# Patient Record
Sex: Female | Born: 1990 | Race: Black or African American | Hispanic: No | Marital: Single | Smoking: Former smoker
Health system: Southern US, Community
[De-identification: ages and names within clinical notes are randomized; demographics above are authoritative.]

## PROBLEM LIST (undated history)

## (undated) ENCOUNTER — Emergency Department (HOSPITAL_COMMUNITY): Admission: EM | Discharge status: Absent without leave | Payer: Medicaid Other

## (undated) DIAGNOSIS — E0500 Thyrotoxicosis with diffuse goiter without thyrotoxic crisis or storm: Secondary | ICD-10-CM

## (undated) DIAGNOSIS — O24419 Gestational diabetes mellitus in pregnancy, unspecified control: Secondary | ICD-10-CM

## (undated) DIAGNOSIS — Z789 Other specified health status: Secondary | ICD-10-CM

## (undated) HISTORY — PX: MOUTH SURGERY: SHX715

## (undated) HISTORY — PX: WISDOM TOOTH EXTRACTION: SHX21

## (undated) HISTORY — DX: Gestational diabetes mellitus in pregnancy, unspecified control: O24.419

## (undated) HISTORY — DX: Other specified health status: Z78.9

## (undated) HISTORY — DX: Thyrotoxicosis with diffuse goiter without thyrotoxic crisis or storm: E05.00

---

## 2011-12-17 ENCOUNTER — Encounter: Payer: Self-pay | Admitting: Obstetrics & Gynecology

## 2013-01-26 ENCOUNTER — Encounter (HOSPITAL_COMMUNITY): Payer: Self-pay | Admitting: Emergency Medicine

## 2013-01-26 ENCOUNTER — Observation Stay (HOSPITAL_COMMUNITY)
Admission: EM | Admit: 2013-01-26 | Discharge: 2013-01-27 | Disposition: A | Discharge status: Home / Self Care | Payer: Medicaid Other | Attending: General Surgery | Admitting: General Surgery

## 2013-01-26 ENCOUNTER — Emergency Department (HOSPITAL_COMMUNITY): Payer: Medicaid Other

## 2013-01-26 DIAGNOSIS — R110 Nausea: Secondary | ICD-10-CM | POA: Insufficient documentation

## 2013-01-26 DIAGNOSIS — R1031 Right lower quadrant pain: Principal | ICD-10-CM | POA: Insufficient documentation

## 2013-01-26 DIAGNOSIS — R52 Pain, unspecified: Secondary | ICD-10-CM | POA: Insufficient documentation

## 2013-01-26 LAB — BASIC METABOLIC PANEL
BUN: 10 mg/dL (ref 6–23)
CO2: 22 mEq/L (ref 19–32)
Calcium: 9.3 mg/dL (ref 8.4–10.5)
Creatinine, Ser: 0.7 mg/dL (ref 0.50–1.10)
Glucose, Bld: 90 mg/dL (ref 70–99)

## 2013-01-26 LAB — URINE MICROSCOPIC-ADD ON

## 2013-01-26 LAB — CBC WITH DIFFERENTIAL/PLATELET
Basophils Relative: 0 % (ref 0–1)
Eosinophils Relative: 5 % (ref 0–5)
HCT: 33.1 % — ABNORMAL LOW (ref 36.0–46.0)
Hemoglobin: 10.1 g/dL — ABNORMAL LOW (ref 12.0–15.0)
Lymphs Abs: 3.4 10*3/uL (ref 0.7–4.0)
MCH: 25.4 pg — ABNORMAL LOW (ref 26.0–34.0)
MCV: 83.2 fL (ref 78.0–100.0)
Monocytes Absolute: 0.3 10*3/uL (ref 0.1–1.0)
Neutro Abs: 2.3 10*3/uL (ref 1.7–7.7)
Platelets: 148 10*3/uL — ABNORMAL LOW (ref 150–400)
RBC: 3.98 MIL/uL (ref 3.87–5.11)

## 2013-01-26 LAB — URINALYSIS, ROUTINE W REFLEX MICROSCOPIC
Bilirubin Urine: NEGATIVE
Nitrite: NEGATIVE
pH: 6 (ref 5.0–8.0)

## 2013-01-26 MED ORDER — ONDANSETRON HCL 4 MG/2ML IJ SOLN: 4.0000 mg | Freq: Four times a day (QID) | INTRAMUSCULAR | Status: DC | PRN

## 2013-01-26 MED ORDER — ENOXAPARIN SODIUM 40 MG/0.4ML ~~LOC~~ SOLN
40.0000 mg | SUBCUTANEOUS | Status: DC
  Administered 2013-01-26: 40 mg via SUBCUTANEOUS
  Filled 2013-01-26: qty 0.4

## 2013-01-26 MED ORDER — LACTATED RINGERS IV SOLN
INTRAVENOUS | Status: DC
  Administered 2013-01-26 – 2013-01-27 (×3): via INTRAVENOUS

## 2013-01-26 MED ORDER — MORPHINE SULFATE 2 MG/ML IJ SOLN
1.0000 mg | INTRAMUSCULAR | Status: DC | PRN
  Administered 2013-01-27 (×2): 2 mg via INTRAVENOUS
  Filled 2013-01-26 (×2): qty 1

## 2013-01-26 MED ORDER — ONDANSETRON HCL 4 MG/2ML IJ SOLN
4.0000 mg | Freq: Once | INTRAMUSCULAR | Status: AC
  Administered 2013-01-26: 4 mg via INTRAVENOUS
  Filled 2013-01-26: qty 2

## 2013-01-26 MED ORDER — IOHEXOL 300 MG/ML  SOLN
50.0000 mL | Freq: Once | INTRAMUSCULAR | Status: AC | PRN
  Administered 2013-01-26: 50 mL via ORAL

## 2013-01-26 MED ORDER — SODIUM CHLORIDE 0.9 % IV SOLN: INTRAVENOUS | Status: DC

## 2013-01-26 MED ORDER — SODIUM CHLORIDE 0.9 % IV BOLUS (SEPSIS)
1000.0000 mL | Freq: Once | INTRAVENOUS | Status: AC
  Administered 2013-01-26: 1000 mL via INTRAVENOUS

## 2013-01-26 MED ORDER — HYDROMORPHONE HCL PF 1 MG/ML IJ SOLN
0.5000 mg | Freq: Once | INTRAMUSCULAR | Status: AC
  Administered 2013-01-26: 0.5 mg via INTRAVENOUS
  Filled 2013-01-26: qty 1

## 2013-01-26 MED ORDER — IOHEXOL 300 MG/ML  SOLN
100.0000 mL | Freq: Once | INTRAMUSCULAR | Status: AC | PRN
  Administered 2013-01-26: 100 mL via INTRAVENOUS

## 2013-01-26 NOTE — ED Notes (Signed)
Patient denies wanting pain medication or nausea medication at this time.

## 2013-01-26 NOTE — ED Provider Notes (Signed)
Scribed for Linda Jakes, MD, the patient was seen in room APA12/APA12. This chart was scribed by Lewanda Rife, ED scribe. Patient's care was started at 1827  CSN: [id number]     Arrival date & time 01/26/13  1737 History   First MD Initiated Contact with Patient 01/26/13 1753     Chief Complaint  Patient presents with  . Abdominal Pain   (Consider location/radiation/quality/duration/timing/severity/associated sxs/prior Treatment) The history is provided by the patient.   HPI Comments: Linda Skinner is a 22 y.o. female who presents to the Emergency Department complaining of waxing and waning RLQ abdominal pain onset 3 days. Describes pain is 9/10 in severity at its worst and 0/10 at rest. Reports associated 2 episodes of diarrhea in last 24 hours. Denies associated fever, vaginal discharge, nausea, emesis, hematochezia, melena, chills, sore throat, rhinorrhea, headache, cough, visual disturbance, dysuria, back pain, edema, rash, and bleeding disorder. Reports pain is aggravated with movement and alleviated at rest. Denies taking any OTC medication PTA to alleviate symptoms.  No PCP. 12/23/12 LMP History reviewed. No pertinent past medical history. History reviewed. No pertinent past surgical history. History reviewed. No pertinent family history. History  Substance Use Topics  . Smoking status: Never Smoker   . Smokeless tobacco: Not on file  . Alcohol Use: No   OB History   Grav Para Term Preterm Abortions TAB SAB Ect Mult Living                 Review of Systems  Constitutional: Negative for fever and chills.  HENT: Negative for sore throat and rhinorrhea.   Eyes: Negative for visual disturbance.  Respiratory: Negative for cough and shortness of breath.   Cardiovascular: Negative for chest pain.  Gastrointestinal: Positive for abdominal pain and diarrhea. Negative for nausea, vomiting and blood in stool.  Genitourinary: Negative for dysuria and vaginal discharge.   Musculoskeletal: Negative for back pain.  Skin: Negative for rash.  Neurological: Negative for headaches.  Hematological: Does not bruise/bleed easily.  Psychiatric/Behavioral: Negative for confusion.    Allergies  Review of patient's allergies indicates no known allergies.  Home Medications  No current outpatient prescriptions on file. BP 128/65  Pulse 79  Temp(Src) 98.4 F (36.9 C) (Oral)  Resp 19  Ht 5' 4.5" (1.638 m)  Wt 260 lb (117.935 kg)  BMI 43.96 kg/m2  SpO2 100%  LMP 12/23/2012 Physical Exam  Nursing note and vitals reviewed. Constitutional: She is oriented to person, place, and time. She appears well-developed and well-nourished. No distress.  HENT:  Head: Normocephalic and atraumatic.  Mouth/Throat: Oropharynx is clear and moist. No oropharyngeal exudate.  Eyes: Conjunctivae and EOM are normal. No scleral icterus.  Neck: Neck supple. No tracheal deviation present.  Cardiovascular: Normal rate, regular rhythm and normal heart sounds.   No murmur heard. Pulmonary/Chest: Effort normal and breath sounds normal. No stridor. No respiratory distress.  Abdominal: Soft. Bowel sounds are normal. There is tenderness (mild right inguinal ) in the right lower quadrant and suprapubic area. There is no guarding.  Musculoskeletal: Normal range of motion.  Neurological: She is alert and oriented to person, place, and time.  Skin: Skin is warm and dry.  Psychiatric: She has a normal mood and affect. Her behavior is normal.    ED Course  Procedures (including critical care time) Medications  0.9 %  sodium chloride infusion (not administered)  enoxaparin (LOVENOX) injection 40 mg (not administered)  lactated ringers infusion (not administered)  morphine 2 MG/ML injection 1-2  mg (not administered)  ondansetron (ZOFRAN) injection 4 mg (not administered)  sodium chloride 0.9 % bolus 1,000 mL (1,000 mLs Intravenous New Bag/Given 01/26/13 1853)  ondansetron (ZOFRAN) injection 4  mg (4 mg Intravenous Given 01/26/13 1852)  HYDROmorphone (DILAUDID) injection 0.5 mg (0.5 mg Intravenous Given 01/26/13 1852)  iohexol (OMNIPAQUE) 300 MG/ML solution 50 mL (50 mLs Oral Contrast Given 01/26/13 1843)  iohexol (OMNIPAQUE) 300 MG/ML solution 100 mL (100 mLs Intravenous Contrast Given 01/26/13 1928)    8:25 PM Reevaluated pt's abdomen: physical exam findings include negative obturator sign and negative heel tap. Pt states pain is completely resolved at this time. Imaging results discussed with pt and she displays understanding.   8:30 PM Consulted Dr. Mariel Craft with general service and pt will be admitted    Labs Review Labs Reviewed  URINALYSIS, ROUTINE W REFLEX MICROSCOPIC - Abnormal; Notable for the following:    Specific Gravity, Urine >1.030 (*)    Hgb urine dipstick TRACE (*)    Ketones, ur TRACE (*)    Protein, ur 30 (*)    Leukocytes, UA SMALL (*)    All other components within normal limits  CBC WITH DIFFERENTIAL - Abnormal; Notable for the following:    Hemoglobin 10.1 (*)    HCT 33.1 (*)    MCH 25.4 (*)    Platelets 148 (*)    Neutrophils Relative % 37 (*)    Lymphocytes Relative 53 (*)    All other components within normal limits  URINE MICROSCOPIC-ADD ON - Abnormal; Notable for the following:    Squamous Epithelial / LPF MANY (*)    Bacteria, UA FEW (*)    Crystals CA OXALATE CRYSTALS (*)    All other components within normal limits  URINE CULTURE  PREGNANCY, URINE  BASIC METABOLIC PANEL  CBC  CREATININE, SERUM  CBC  BASIC METABOLIC PANEL   Results for orders placed during the hospital encounter of 01/26/13  URINALYSIS, ROUTINE W REFLEX MICROSCOPIC      Result Value Range   Color, Urine YELLOW  YELLOW   APPearance CLEAR  CLEAR   Specific Gravity, Urine >1.030 (*) 1.005 - 1.030   pH 6.0  5.0 - 8.0   Glucose, UA NEGATIVE  NEGATIVE mg/dL   Hgb urine dipstick TRACE (*) NEGATIVE   Bilirubin Urine NEGATIVE  NEGATIVE   Ketones, ur TRACE (*) NEGATIVE  mg/dL   Protein, ur 30 (*) NEGATIVE mg/dL   Urobilinogen, UA 1.0  0.0 - 1.0 mg/dL   Nitrite NEGATIVE  NEGATIVE   Leukocytes, UA SMALL (*) NEGATIVE  PREGNANCY, URINE      Result Value Range   Preg Test, Ur NEGATIVE  NEGATIVE  CBC WITH DIFFERENTIAL      Result Value Range   WBC 6.3  4.0 - 10.5 K/uL   RBC 3.98  3.87 - 5.11 MIL/uL   Hemoglobin 10.1 (*) 12.0 - 15.0 g/dL   HCT 40.9 (*) 81.1 - 91.4 %   MCV 83.2  78.0 - 100.0 fL   MCH 25.4 (*) 26.0 - 34.0 pg   MCHC 30.5  30.0 - 36.0 g/dL   RDW 78.2  95.6 - 21.3 %   Platelets 148 (*) 150 - 400 K/uL   Neutrophils Relative % 37 (*) 43 - 77 %   Lymphocytes Relative 53 (*) 12 - 46 %   Monocytes Relative 5  3 - 12 %   Eosinophils Relative 5  0 - 5 %   Basophils Relative 0  0 -  1 %   Neutro Abs 2.3  1.7 - 7.7 K/uL   Lymphs Abs 3.4  0.7 - 4.0 K/uL   Monocytes Absolute 0.3  0.1 - 1.0 K/uL   Eosinophils Absolute 0.3  0.0 - 0.7 K/uL   Basophils Absolute 0.0  0.0 - 0.1 K/uL   Smear Review       Value: PLATELET CLUMPS NOTED ON SMEAR, COUNT APPEARS ADEQUATE  BASIC METABOLIC PANEL      Result Value Range   Sodium 138  135 - 145 mEq/L   Potassium 3.5  3.5 - 5.1 mEq/L   Chloride 105  96 - 112 mEq/L   CO2 22  19 - 32 mEq/L   Glucose, Bld 90  70 - 99 mg/dL   BUN 10  6 - 23 mg/dL   Creatinine, Ser 1.61  0.50 - 1.10 mg/dL   Calcium 9.3  8.4 - 09.6 mg/dL   GFR calc non Af Amer >90  >90 mL/min   GFR calc Af Amer >90  >90 mL/min  URINE MICROSCOPIC-ADD ON      Result Value Range   Squamous Epithelial / LPF MANY (*) RARE   WBC, UA 21-50  <3 WBC/hpf   RBC / HPF 3-6  <3 RBC/hpf   Bacteria, UA FEW (*) RARE   Crystals CA OXALATE CRYSTALS (*) NEGATIVE   Patient's urinalysis most likely consistent with contamination do to many squamous epithelial cells white blood cell count was 21-50 do not think that this is a urinary tract infection patient did not have any D. sure you symptoms.  Imaging Review Ct Abdomen Pelvis W Contrast  01/26/2013   *RADIOLOGY  REPORT*  Clinical Data: 3-day history of right lower quadrant abdominal pain.  CT ABDOMEN AND PELVIS WITH CONTRAST  Technique:  Multidetector CT imaging of the abdomen and pelvis was performed following the standard protocol during bolus administration of intravenous contrast.  Contrast: 100 ml Omnipaque-300 IV.  Oral contrast was also administered.  Comparison: None.  Findings: Mild wall thickening involving the appendix, measuring up to approximately 9 mm diameter.  Gas throughout the entire appendix, though there is mild periappendiceal inflammation.  No extraluminal gas.  No abnormal fluid collections.  Moderate amount of ascites dependently in the pelvis and small amount of ascites in the left paracolic gutter.  Stomach normal in appearance, distended with oral contrast and food.  Normal appearing small bowel.  Moderate stool burden in the ascending and transverse colon.  Descending colon, sigmoid colon, and rectum decompressed.  Normal-appearing liver with an anatomic variant in that the left lobe extends well across the midline into the left upper quadrant. Normal spleen with adjacent foci of accessory splenic tissue inferior to the tip of the spleen.  Normal pancreas, adrenal glands, kidneys, and gallbladder.  No biliary ductal dilation. Normal-appearing vascular structures.  No significant lymphadenopathy.  Uterus and ovaries normal in appearance for age.  Small bilateral ovarian cyst.  No dominant cyst.  Visualized lung bases clear.  Heart size normal.  Bone window images unremarkable.  IMPRESSION:  1.  Mild thickening of the wall of the appendix with minimal periappendiceal inflammation, consistent with appendicitis.  This could be acute or chronic; in the absence of leukocytosis, chronic inflammation is favored. 2.  Moderate free fluid in the pelvis and in the left paracolic gutter.  This is more than one would expect to be associated with appendicitis.  While a dominant ovarian cyst is not visualized, a  recent ovarian cyst rupture is  considered.   Original Report Authenticated By: Hulan Saas, M.D.    MDM   1. Abdominal pain, acute, right lower quadrant    Discussed with the general surgeon on call. Patient will be admitted and observed overnight for possible developing appendicitis. Clinically patient on repeat exam has no subjective pain and no tenderness at all to deep palpation in the right lower corner. Suspect that patient may have had a ruptured ovarian cyst and the fluid is irritating the appendix. Patient has no leukocytosis. As stated above the urinalysis appears to be contaminated.   I personally performed the services described in this documentation, which was scribed in my presence. The recorded information has been reviewed and is accurate.     Linda Jakes, MD 01/26/13 2052

## 2013-01-26 NOTE — ED Notes (Signed)
Pain to "ovary" rlq x 3 days. Hurts worse with movement. Denies vag d/c. Denies n/v. C/o diarrhea x2 in past 24hrs, denies black or bloody stools. Nad. Mm wet

## 2013-01-27 LAB — CBC
HCT: 35.5 % — ABNORMAL LOW (ref 36.0–46.0)
MCH: 27.5 pg (ref 26.0–34.0)
MCV: 82.8 fL (ref 78.0–100.0)
Platelets: 244 10*3/uL (ref 150–400)
RDW: 14.7 % (ref 11.5–15.5)

## 2013-01-27 LAB — BASIC METABOLIC PANEL
BUN: 5 mg/dL — ABNORMAL LOW (ref 6–23)
CO2: 26 mEq/L (ref 19–32)
Calcium: 8.9 mg/dL (ref 8.4–10.5)
Creatinine, Ser: 0.7 mg/dL (ref 0.50–1.10)

## 2013-01-27 NOTE — H&P (Signed)
Linda Skinner is an 22 y.o. female.   Chief Complaint: Right lower quadrant abdominal pain HPI: Patient presented to [facility] with approximately 3 days of persistent constant right lower quadrant abdominal pain. Pain is worse with some movement and palpation. No significant relieving factors. No change with diet. Appetite has been good. No change in bowel movements. No melena or hematochezia. No change in urination. No similar symptomatology in the past. No sick contacts. No unusual exposures. No recent travel. Patient does state she is late for her menstrual cycle. No previous issues with increased menstrual bleeding or irregularities. This morning patient states her pain is improved with only one episode earlier this morning. She is hungry  History reviewed. No pertinent past medical history.  History reviewed. No pertinent past surgical history.  History reviewed. No pertinent family history. Social History:  reports that she has never smoked. She does not have any smokeless tobacco history on file. She reports that she does not drink alcohol or use illicit drugs.  Allergies: No Known Allergies  No prescriptions prior to admission    Results for orders placed during the hospital encounter of 01/26/13 (from the past 48 hour(s))  URINALYSIS, ROUTINE W REFLEX MICROSCOPIC     Status: Abnormal   Collection Time    01/26/13  6:16 PM      Result Value Range   Color, Urine YELLOW  YELLOW   APPearance CLEAR  CLEAR   Specific Gravity, Urine >1.030 (*) 1.005 - 1.030   pH 6.0  5.0 - 8.0   Glucose, UA NEGATIVE  NEGATIVE mg/dL   Hgb urine dipstick TRACE (*) NEGATIVE   Bilirubin Urine NEGATIVE  NEGATIVE   Ketones, ur TRACE (*) NEGATIVE mg/dL   Protein, ur 30 (*) NEGATIVE mg/dL   Urobilinogen, UA 1.0  0.0 - 1.0 mg/dL   Nitrite NEGATIVE  NEGATIVE   Leukocytes, UA SMALL (*) NEGATIVE  PREGNANCY, URINE     Status: None   Collection Time    01/26/13  6:16 PM      Result Value Range    Preg Test, Ur NEGATIVE  NEGATIVE   Comment:            THE SENSITIVITY OF THIS     METHODOLOGY IS >20 mIU/mL.  URINE MICROSCOPIC-ADD ON     Status: Abnormal   Collection Time    01/26/13  6:16 PM      Result Value Range   Squamous Epithelial / LPF MANY (*) RARE   WBC, UA 21-50  <3 WBC/hpf   RBC / HPF 3-6  <3 RBC/hpf   Bacteria, UA FEW (*) RARE   Crystals CA OXALATE CRYSTALS (*) NEGATIVE  CBC WITH DIFFERENTIAL     Status: Abnormal   Collection Time    01/26/13  6:49 PM      Result Value Range   WBC 6.3  4.0 - 10.5 K/uL   RBC 3.98  3.87 - 5.11 MIL/uL   Hemoglobin 10.1 (*) 12.0 - 15.0 g/dL   HCT 11.9 (*) 14.7 - 82.9 %   MCV 83.2  78.0 - 100.0 fL   MCH 25.4 (*) 26.0 - 34.0 pg   MCHC 30.5  30.0 - 36.0 g/dL   RDW 56.2  13.0 - 86.5 %   Platelets 148 (*) 150 - 400 K/uL   Neutrophils Relative % 37 (*) 43 - 77 %   Lymphocytes Relative 53 (*) 12 - 46 %   Monocytes Relative 5  3 - 12 %  Eosinophils Relative 5  0 - 5 %   Basophils Relative 0  0 - 1 %   Neutro Abs 2.3  1.7 - 7.7 K/uL   Lymphs Abs 3.4  0.7 - 4.0 K/uL   Monocytes Absolute 0.3  0.1 - 1.0 K/uL   Eosinophils Absolute 0.3  0.0 - 0.7 K/uL   Basophils Absolute 0.0  0.0 - 0.1 K/uL   Smear Review       Value: PLATELET CLUMPS NOTED ON SMEAR, COUNT APPEARS ADEQUATE   Comment: LARGE PLATELETS PRESENT  BASIC METABOLIC PANEL     Status: None   Collection Time    01/26/13  6:49 PM      Result Value Range   Sodium 138  135 - 145 mEq/L   Potassium 3.5  3.5 - 5.1 mEq/L   Chloride 105  96 - 112 mEq/L   CO2 22  19 - 32 mEq/L   Glucose, Bld 90  70 - 99 mg/dL   BUN 10  6 - 23 mg/dL   Creatinine, Ser 1.61  0.50 - 1.10 mg/dL   Calcium 9.3  8.4 - 09.6 mg/dL   GFR calc non Af Amer >90  >90 mL/min   GFR calc Af Amer >90  >90 mL/min   Comment: (NOTE)     The eGFR has been calculated using the CKD EPI equation.     This calculation has not been validated in all clinical situations.     eGFR's persistently <90 mL/min signify possible  Chronic Kidney     Disease.  CBC     Status: Abnormal   Collection Time    01/27/13  6:04 AM      Result Value Range   WBC 4.7  4.0 - 10.5 K/uL   RBC 4.29  3.87 - 5.11 MIL/uL   Hemoglobin 11.8 (*) 12.0 - 15.0 g/dL   HCT 04.5 (*) 40.9 - 81.1 %   MCV 82.8  78.0 - 100.0 fL   MCH 27.5  26.0 - 34.0 pg   MCHC 33.2  30.0 - 36.0 g/dL   RDW 91.4  78.2 - 95.6 %   Platelets 244  150 - 400 K/uL   Comment: DELTA CHECK NOTED  BASIC METABOLIC PANEL     Status: Abnormal   Collection Time    01/27/13  6:04 AM      Result Value Range   Sodium 138  135 - 145 mEq/L   Potassium 3.9  3.5 - 5.1 mEq/L   Chloride 106  96 - 112 mEq/L   CO2 26  19 - 32 mEq/L   Glucose, Bld 91  70 - 99 mg/dL   BUN 5 (*) 6 - 23 mg/dL   Creatinine, Ser 2.13  0.50 - 1.10 mg/dL   Calcium 8.9  8.4 - 08.6 mg/dL   GFR calc non Af Amer >90  >90 mL/min   GFR calc Af Amer >90  >90 mL/min   Comment: (NOTE)     The eGFR has been calculated using the CKD EPI equation.     This calculation has not been validated in all clinical situations.     eGFR's persistently <90 mL/min signify possible Chronic Kidney     Disease.   Ct Abdomen Pelvis W Contrast  01/26/2013   *RADIOLOGY REPORT*  Clinical Data: 3-day history of right lower quadrant abdominal pain.  CT ABDOMEN AND PELVIS WITH CONTRAST  Technique:  Multidetector CT imaging of the abdomen and pelvis was performed following the  standard protocol during bolus administration of intravenous contrast.  Contrast: 100 ml Omnipaque-300 IV.  Oral contrast was also administered.  Comparison: None.  Findings: Mild wall thickening involving the appendix, measuring up to approximately 9 mm diameter.  Gas throughout the entire appendix, though there is mild periappendiceal inflammation.  No extraluminal gas.  No abnormal fluid collections.  Moderate amount of ascites dependently in the pelvis and small amount of ascites in the left paracolic gutter.  Stomach normal in appearance, distended with oral  contrast and food.  Normal appearing small bowel.  Moderate stool burden in the ascending and transverse colon.  Descending colon, sigmoid colon, and rectum decompressed.  Normal-appearing liver with an anatomic variant in that the left lobe extends well across the midline into the left upper quadrant. Normal spleen with adjacent foci of accessory splenic tissue inferior to the tip of the spleen.  Normal pancreas, adrenal glands, kidneys, and gallbladder.  No biliary ductal dilation. Normal-appearing vascular structures.  No significant lymphadenopathy.  Uterus and ovaries normal in appearance for age.  Small bilateral ovarian cyst.  No dominant cyst.  Visualized lung bases clear.  Heart size normal.  Bone window images unremarkable.  IMPRESSION:  1.  Mild thickening of the wall of the appendix with minimal periappendiceal inflammation, consistent with appendicitis.  This could be acute or chronic; in the absence of leukocytosis, chronic inflammation is favored. 2.  Moderate free fluid in the pelvis and in the left paracolic gutter.  This is more than one would expect to be associated with appendicitis.  While a dominant ovarian cyst is not visualized, a recent ovarian cyst rupture is considered.   Original Report Authenticated By: Hulan Saas, M.D.    Review of Systems  Constitutional: Positive for chills. Negative for fever, weight loss, malaise/fatigue and diaphoresis.  HENT: Negative.   Eyes: Negative.   Respiratory: Negative.   Cardiovascular: Negative.   Gastrointestinal: Positive for nausea and abdominal pain (right lower quadrant pain). Negative for vomiting, diarrhea, constipation, blood in stool and melena.  Genitourinary: Negative.   Musculoskeletal: Negative.   Skin: Negative.   Neurological: Negative.  Negative for weakness.  Endo/Heme/Allergies: Negative.   Psychiatric/Behavioral: Negative.     Blood pressure 99/53, pulse 64, temperature 98.3 F (36.8 C), temperature source Oral,  resp. rate 16, height 5' 4.5" (1.638 m), weight 117.935 kg (260 lb), last menstrual period 12/23/2012, SpO2 100.00%. Physical Exam  Constitutional: She is oriented to person, place, and time. She appears well-developed and well-nourished. No distress.  HENT:  Head: Normocephalic and atraumatic.  Eyes: Conjunctivae and EOM are normal. Pupils are equal, round, and reactive to light. No scleral icterus.  Neck: Normal range of motion. Neck supple. No tracheal deviation present. No thyromegaly present.  Cardiovascular: Normal rate, regular rhythm and normal heart sounds.   Respiratory: Effort normal and breath sounds normal. No respiratory distress.  GI: Soft. Bowel sounds are normal. She exhibits no distension and no mass. There is no tenderness. There is no rebound and no guarding.  Lymphadenopathy:    She has no cervical adenopathy.  Neurological: She is alert and oriented to person, place, and time.  Skin: Skin is warm and dry.     Assessment/Plan Right lower quadrant abdominal pain. At this point patient is admitted for continued monitoring and evaluation. Given her history the likelihood of acute appendicitis is relatively low however she'll be monitored to ensure that her leukocytosis is not increased. No antibiotics given at this time she does not have  a elevated WBC count. As her pain has improved her diet will be slowly advanced as tolerated. I do feel this is likely more of a GYN source of her symptomatology. I have discussed the CT findings with the patient but again at this time my suspicion of a appendiceal etiologies extremely low   Giann Obara C 01/27/2013, 9:28 AM

## 2013-01-27 NOTE — Progress Notes (Signed)
UR Chart Review Completed  

## 2013-01-27 NOTE — Progress Notes (Signed)
Patient discharged home with instructions given on medications and follow up visits,verbalized understanding. Patient is to see [facility] Physician next week,office was closed unable to make appointment,but informed patient that she will need to call on Tuesday Sept 2,2014 to make appointment with Dr fusco,verbalized understanding. No c/o pain or discomfort at this time.Vital signs stable.Accompanied by staff to an awaiting vehicle.

## 2013-01-28 LAB — URINE CULTURE

## 2013-02-22 NOTE — Discharge Summary (Signed)
Physician Discharge Summary  Patient ID: Linda Skinner MRN: [id number] DOB/AGE: 11/09/1990 22 y.o.  Admit date: 01/26/2013 Discharge date: 01/27/2013  Admission Diagnoses: right lower quadrant pain  Discharge Diagnoses:  The same Active Problems:   * No active hospital problems. *   Discharged Condition: stable  Hospital Course: Patient presented with rlq pain.  Admitted for observation to determine progression.  No evidence of acute intra-abdominal issue.  Pain improving without intervention.  Discharged  Consults: None  Significant Diagnostic Studies: labs: am  Treatments: IV hydration  Discharge Exam: Blood pressure 108/69, pulse 86, temperature 98.6 F (37 C), temperature source Oral, resp. rate 16, height 5' 4.5" (1.638 m), weight 117.935 kg (260 lb), last menstrual period 12/23/2012, SpO2 98.00%. General appearance: alert and no distress Resp: clear to auscultation bilaterally Cardio: regular rate and rhythm GI: soft, non-tender; bowel sounds normal; no masses,  no organomegaly  Disposition: 01-Home or Self Care  Discharge Orders   Future Orders Complete By Expires   Diet - low sodium heart healthy  As directed    Discharge instructions  As directed    Comments:     Increase activity as tolerated.   Increase activity slowly  As directed        Medication List    Notice   You have not been prescribed any medications.         Follow-up Information   Schedule an appointment as soon as possible for a visit with Cassell Smiles., MD. (call office to make appointment.)    Specialty:  Internal Medicine   Contact information:   [address] [place] [address] [phone]       Signed: Tilford Pillar C 02/22/2013, 11:58 PM

## 2015-02-10 ENCOUNTER — Encounter (HOSPITAL_COMMUNITY): Payer: Self-pay | Admitting: *Deleted

## 2015-02-10 ENCOUNTER — Emergency Department (HOSPITAL_COMMUNITY)
Admission: EM | Admit: 2015-02-10 | Discharge: 2015-02-11 | Disposition: A | Discharge status: Home / Self Care | Payer: Medicaid Other | Attending: Emergency Medicine | Admitting: Emergency Medicine

## 2015-02-10 DIAGNOSIS — Z3202 Encounter for pregnancy test, result negative: Secondary | ICD-10-CM | POA: Insufficient documentation

## 2015-02-10 DIAGNOSIS — N390 Urinary tract infection, site not specified: Secondary | ICD-10-CM

## 2015-02-10 NOTE — ED Notes (Signed)
Pt c/o burning with urination that started yesterday,

## 2015-02-10 NOTE — ED Provider Notes (Signed)
CSN: [id number]   Arrival date & time 02/10/15 2339  History  This chart was scribed for Shon Baton, MD by Bethel Born, ED Scribe. This patient was seen in room [facility]18[facility]18 and the patient's care was started at 12:08 AM.  Chief Complaint  Patient presents with  . Urinary Tract Infection    HPI The history is provided by the patient. No language interpreter was used.   Linda Skinner is a 24 y.o. female who presents to the Emergency Department complaining of dysuria with gradual onset yesterday. The discomfort has worsened today and is rated 9/10 in severity.  Associated symptoms include back pain, hematuria (pt notes passing clots of blood with urine), and increased urinary frequency. She had similar symptoms with a UTI years ago. Pt denies fever. LNMP was 01/27/15. She is sexually active with 1 partner and always uses protection. Pt is not concerned for STDs.   History reviewed. No pertinent past medical history.  History reviewed. No pertinent past surgical history.  No family history on file.  Social History  Substance Use Topics  . Smoking status: Never Smoker   . Smokeless tobacco: None  . Alcohol Use: No     Review of Systems  Constitutional: Negative for fever.  Gastrointestinal: Negative for nausea, vomiting and abdominal pain.  Genitourinary: Positive for dysuria and hematuria. Negative for flank pain.  All other systems reviewed and are negative.  Home Medications   Prior to Admission medications   Medication Sig Start Date End Date Taking? Authorizing Provider  cephALEXin (KEFLEX) 500 MG capsule Take 1 capsule (500 mg total) by mouth 2 (two) times daily. 02/11/15   Shon Baton, MD    Allergies  Review of patient's allergies indicates no known allergies.  Triage Vitals: BP 114/78 mmHg  Pulse 86  Temp(Src) 98.1 F (36.7 C) (Oral)  Resp 18  Ht 5' 4.5" (1.638 m)  Wt 241 lb (109.317 kg)  BMI 40.74 kg/m2  SpO2 100%  LMP 01/27/2015  Physical Exam   Constitutional: She is oriented to person, place, and time. She appears well-developed and well-nourished. No distress.  HENT:  Head: Normocephalic and atraumatic.  Cardiovascular: Normal rate, regular rhythm and normal heart sounds.   No murmur heard. Pulmonary/Chest: Effort normal. No respiratory distress. She has no wheezes.  Abdominal: Soft. Bowel sounds are normal. There is no tenderness. There is no rebound.  Genitourinary:  NO CVA tenderness  Neurological: She is alert and oriented to person, place, and time.  Skin: Skin is warm and dry.  Psychiatric: She has a normal mood and affect.  Nursing note and vitals reviewed.   ED Course  Procedures   DIAGNOSTIC STUDIES: Oxygen Saturation is 100% on RA, normal by my interpretation.    COORDINATION OF CARE: 12:11 AM Discussed treatment plan which includes lab work with pt at bedside and pt agreed to plan.  Labs Reviewed  URINALYSIS, ROUTINE W REFLEX MICROSCOPIC (NOT AT [facility]) - Abnormal; Notable for the following:    APPearance HAZY (*)    Hgb urine dipstick LARGE (*)    Protein, ur 100 (*)    All other components within normal limits  URINE MICROSCOPIC-ADD ON - Abnormal; Notable for the following:    Squamous Epithelial / LPF MANY (*)    Bacteria, UA MANY (*)    All other components within normal limits  URINE CULTURE  PREGNANCY, URINE    Imaging Review No results found.   MDM   Final diagnoses:  UTI (lower  urinary tract infection)   Symptoms consistent with prior urinary tract infections. Nontoxic. Afebrile. No signs or symptoms of pyelonephritis. Patient with too numerous to count white cells and many bacteria in her urine. Urine culture sent. Will discharge on Keflex.  After history, exam, and medical workup I feel the patient has been appropriately medically screened and is safe for discharge home. Pertinent diagnoses were discussed with the patient. Patient was given return precautions.  I personally performed  the services described in this documentation, which was scribed in my presence. The recorded information has been reviewed and is accurate.    Shon Baton, MD 02/11/15 [phone]

## 2015-02-11 LAB — URINALYSIS, ROUTINE W REFLEX MICROSCOPIC
Bilirubin Urine: NEGATIVE
GLUCOSE, UA: NEGATIVE mg/dL
KETONES UR: NEGATIVE mg/dL
LEUKOCYTES UA: NEGATIVE
NITRITE: NEGATIVE
PH: 7 (ref 5.0–8.0)
PROTEIN: 100 mg/dL — AB
Specific Gravity, Urine: 1.025 (ref 1.005–1.030)
Urobilinogen, UA: 0.2 mg/dL (ref 0.0–1.0)

## 2015-02-11 LAB — URINE MICROSCOPIC-ADD ON

## 2015-02-11 LAB — PREGNANCY, URINE: Preg Test, Ur: NEGATIVE

## 2015-02-11 MED ORDER — CEPHALEXIN 500 MG PO CAPS: 500.0000 mg | ORAL_CAPSULE | Freq: Two times a day (BID) | ORAL | Status: DC

## 2015-02-11 NOTE — Discharge Instructions (Signed)

## 2015-02-11 NOTE — ED Notes (Signed)
Pt states understanding of care given and follow up instructions.  Instructed pt per Dr. Wilkie Aye that she could use AZO for pain and frequency

## 2015-02-13 LAB — URINE CULTURE: Culture: >=100000

## 2015-02-14 ENCOUNTER — Telehealth (HOSPITAL_BASED_OUTPATIENT_CLINIC_OR_DEPARTMENT_OTHER): Payer: Self-pay | Admitting: Emergency Medicine

## 2015-02-14 NOTE — Telephone Encounter (Signed)
Post ED Visit - Positive Culture Follow-up  Culture report reviewed by antimicrobial stewardship pharmacist:   Celedonio Miyamoto, Pharm.D., BCPS  Georgina Pillion, Pharm.D., BCPS  [place], [place].D., BCPS, AAHIVP  Estella Husk, Pharm.D., BCPS, AAHIVP  Cristy Reyes, [address].D.  Tennis Must, [place].D.  Positive urine culture E. Coli Treated with cephalexin, organism sensitive to the same and no further patient follow-up is required at this time.  Berle Mull 02/14/2015, 9:53 AM

## 2015-07-23 ENCOUNTER — Emergency Department (HOSPITAL_COMMUNITY)
Admission: EM | Admit: 2015-07-23 | Discharge: 2015-07-23 | Disposition: A | Discharge status: Home / Self Care | Payer: Medicaid Other | Attending: Emergency Medicine | Admitting: Emergency Medicine

## 2015-07-23 ENCOUNTER — Encounter (HOSPITAL_COMMUNITY): Payer: Self-pay

## 2015-07-23 DIAGNOSIS — F17200 Nicotine dependence, unspecified, uncomplicated: Secondary | ICD-10-CM | POA: Insufficient documentation

## 2015-07-23 DIAGNOSIS — J069 Acute upper respiratory infection, unspecified: Secondary | ICD-10-CM | POA: Insufficient documentation

## 2015-07-23 MED ORDER — PSEUDOEPHEDRINE HCL 60 MG PO TABS: 60.0000 mg | ORAL_TABLET | Freq: Four times a day (QID) | ORAL | Status: DC | PRN

## 2015-07-23 MED ORDER — BENZONATATE 100 MG PO CAPS: 200.0000 mg | ORAL_CAPSULE | Freq: Three times a day (TID) | ORAL | Status: DC | PRN

## 2015-07-23 NOTE — ED Notes (Signed)
Pt c/o cough, congestion, fever, body aches for past 2 days.

## 2015-07-23 NOTE — Discharge Instructions (Signed)
Upper Respiratory Infection, Adult Most upper respiratory infections (URIs) are a viral infection of the air passages leading to the lungs. A URI affects the nose, throat, and upper air passages. The most common type of URI is nasopharyngitis and is typically referred to as "the common cold." URIs run their course and usually go away on their own. Most of the time, a URI does not require medical attention, but sometimes a bacterial infection in the upper airways can follow a viral infection. This is called a secondary infection. Sinus and middle ear infections are common types of secondary upper respiratory infections. Bacterial pneumonia can also complicate a URI. A URI can worsen asthma and chronic obstructive pulmonary disease (COPD). Sometimes, these complications can require emergency medical care and may be life threatening.  CAUSES Almost all URIs are caused by viruses. A virus is a type of germ and can spread from one person to another.  RISKS FACTORS You may be at risk for a URI if:   You smoke.   You have chronic heart or lung disease.  You have a weakened defense (immune) system.   You are very young or very old.   You have nasal allergies or asthma.  You work in crowded or poorly ventilated areas.  You work in health care facilities or schools. SIGNS AND SYMPTOMS  Symptoms typically develop 2-3 days after you come in contact with a cold virus. Most viral URIs last 7-10 days. However, viral URIs from the influenza virus (flu virus) can last 14-18 days and are typically more severe. Symptoms may include:   Runny or stuffy (congested) nose.   Sneezing.   Cough.   Sore throat.   Headache.   Fatigue.   Fever.   Loss of appetite.   Pain in your forehead, behind your eyes, and over your cheekbones (sinus pain).  Muscle aches.  DIAGNOSIS  Your health care provider may diagnose a URI by:  Physical exam.  Tests to check that your symptoms are not due to  another condition such as:  Strep throat.  Sinusitis.  Pneumonia.  Asthma. TREATMENT  A URI goes away on its own with time. It cannot be cured with medicines, but medicines may be prescribed or recommended to relieve symptoms. Medicines may help:  Reduce your fever.  Reduce your cough.  Relieve nasal congestion. HOME CARE INSTRUCTIONS   Take medicines only as directed by your health care provider.   Gargle warm saltwater or take cough drops to comfort your throat as directed by your health care provider.  Use a warm mist humidifier or inhale steam from a shower to increase air moisture. This may make it easier to breathe.  Drink enough fluid to keep your urine clear or pale yellow.   Eat soups and other clear broths and maintain good nutrition.   Rest as needed.   Return to work when your temperature has returned to normal or as your health care provider advises. You may need to stay home longer to avoid infecting others. You can also use a face mask and careful hand washing to prevent spread of the virus.  Increase the usage of your inhaler if you have asthma.   Do not use any tobacco products, including cigarettes, chewing tobacco, or electronic cigarettes. If you need help quitting, ask your health care provider. PREVENTION  The best way to protect yourself from getting a cold is to practice good hygiene.   Avoid oral or hand contact with people with cold   symptoms.   Wash your hands often if contact occurs.  There is no clear evidence that vitamin C, vitamin E, echinacea, or exercise reduces the chance of developing a cold. However, it is always recommended to get plenty of rest, exercise, and practice good nutrition.  SEEK MEDICAL CARE IF:   You are getting worse rather than better.   Your symptoms are not controlled by medicine.   You have chills.  You have worsening shortness of breath.  You have brown or red mucus.  You have yellow or brown nasal  discharge.  You have pain in your face, especially when you bend forward.  You have a fever.  You have swollen neck glands.  You have pain while swallowing.  You have white areas in the back of your throat. SEEK IMMEDIATE MEDICAL CARE IF:   You have severe or persistent:  Headache.  Ear pain.  Sinus pain.  Chest pain.  You have chronic lung disease and any of the following:  Wheezing.  Prolonged cough.  Coughing up blood.  A change in your usual mucus.  You have a stiff neck.  You have changes in your:  Vision.  Hearing.  Thinking.  Mood. MAKE SURE YOU:   Understand these instructions.  Will watch your condition.  Will get help right away if you are not doing well or get worse.   This information is not intended to replace advice given to you by your health care provider. Make sure you discuss any questions you have with your health care provider.   Document Released: 11/11/2000 Document Revised: 10/02/2014 Document Reviewed: 08/23/2013 Elsevier Interactive Patient Education 2016 [employer]  

## 2015-07-25 NOTE — ED Provider Notes (Signed)
CSN: [id number]     Arrival date & time 07/23/15  0917 History   First MD Initiated Contact with Patient 07/23/15 0930     Chief Complaint  Patient presents with  . URI     (Consider location/radiation/quality/duration/timing/severity/associated sxs/prior Treatment) The history is provided by the patient.   Linda Skinner is a 25 y.o. female presenting with a 2 day history of uri type symptoms which includes nasal congestion with clear rhinorrhea, low grade fever and nonproductive cough.  Symptoms do not include sore throat, shortness of breath, chest pain,  Nausea, vomiting or diarrhea.  The patient has taken Delsym prior to arrival with no significant improvement in symptoms.     History reviewed. No pertinent past medical history. History reviewed. No pertinent past surgical history. No family history on file. Social History  Substance Use Topics  . Smoking status: Current Every Day Smoker  . Smokeless tobacco: None  . Alcohol Use: No   OB History    No data available     Review of Systems  Constitutional: Positive for fever. Negative for chills.  HENT: Positive for congestion and rhinorrhea. Negative for ear pain, sinus pressure, sore throat, trouble swallowing and voice change.   Eyes: Negative for discharge.  Respiratory: Positive for cough. Negative for shortness of breath, wheezing and stridor.   Cardiovascular: Negative for chest pain.  Gastrointestinal: Negative for abdominal pain.  Genitourinary: Negative.   Musculoskeletal: Negative for myalgias.      Allergies  Review of patient's allergies indicates no known allergies.  Home Medications   Prior to Admission medications   Medication Sig Start Date End Date Taking? Authorizing Provider  dextromethorphan (DELSYM) 30 MG/5ML liquid Take 30 mg by mouth at bedtime as needed for cough.   Yes Historical Provider, MD  etonogestrel (NEXPLANON) 68 MG IMPL implant 1 each by Subdermal route once.   Yes Historical  Provider, MD  benzonatate (TESSALON) 100 MG capsule Take 2 capsules (200 mg total) by mouth 3 (three) times daily as needed. 07/23/15   Burgess Amor, PA-C  pseudoephedrine (SUDAFED) 60 MG tablet Take 1 tablet (60 mg total) by mouth every 6 (six) hours as needed for congestion. 07/23/15   Burgess Amor, PA-C   BP 118/68 mmHg  Pulse 98  Temp(Src) 99.8 F (37.7 C) (Oral)  Resp 18  Ht  (1.626 m)  Wt 109.317 kg  BMI 41.35 kg/m2  SpO2 100%  LMP 05/06/2015 Physical Exam  Constitutional: She is oriented to person, place, and time. She appears well-developed and well-nourished.  HENT:  Head: Normocephalic and atraumatic.  Right Ear: Tympanic membrane and ear canal normal.  Left Ear: Tympanic membrane and ear canal normal.  Nose: Mucosal edema and rhinorrhea present.  Mouth/Throat: Uvula is midline, oropharynx is clear and moist and mucous membranes are normal. No oropharyngeal exudate, posterior oropharyngeal edema, posterior oropharyngeal erythema or tonsillar abscesses.  Eyes: Conjunctivae are normal.  Cardiovascular: Normal rate and normal heart sounds.   Pulmonary/Chest: Effort normal. No respiratory distress. She has no wheezes. She has no rales.  Abdominal: Soft. There is no tenderness.  Musculoskeletal: Normal range of motion.  Neurological: She is alert and oriented to person, place, and time.  Skin: Skin is warm and dry. No rash noted.  Psychiatric: She has a normal mood and affect.    ED Course  Procedures (including critical care time) Labs Review Labs Reviewed - No data to display  Imaging Review No results found. I have personally reviewed and evaluated  these images and lab results as part of my medical decision-making.   EKG Interpretation None      MDM   Final diagnoses:  Acute URI    Pt with sx c/w viral uri. Tessalon, sudafed, rest, increased fluid intake, motrin or tylenol prn fever reduction. F/u with pcp if sx persist or worsen.    Burgess Amor,  PA-C 07/25/15 1047  Vanetta Mulders, MD 07/25/15 1729

## 2016-04-19 ENCOUNTER — Emergency Department (HOSPITAL_COMMUNITY)
Admission: EM | Admit: 2016-04-19 | Discharge: 2016-04-19 | Disposition: A | Discharge status: Home / Self Care | Payer: Self-pay | Attending: Emergency Medicine | Admitting: Emergency Medicine

## 2016-04-19 ENCOUNTER — Encounter (HOSPITAL_COMMUNITY): Payer: Self-pay | Admitting: Emergency Medicine

## 2016-04-19 ENCOUNTER — Emergency Department (HOSPITAL_COMMUNITY): Payer: Self-pay

## 2016-04-19 DIAGNOSIS — Z79899 Other long term (current) drug therapy: Secondary | ICD-10-CM | POA: Insufficient documentation

## 2016-04-19 DIAGNOSIS — F17290 Nicotine dependence, other tobacco product, uncomplicated: Secondary | ICD-10-CM | POA: Insufficient documentation

## 2016-04-19 DIAGNOSIS — J209 Acute bronchitis, unspecified: Secondary | ICD-10-CM | POA: Insufficient documentation

## 2016-04-19 LAB — POC URINE PREG, ED: Preg Test, Ur: NEGATIVE

## 2016-04-19 MED ORDER — PROMETHAZINE-CODEINE 6.25-10 MG/5ML PO SYRP: 5.0000 mL | ORAL_SOLUTION | ORAL | 0 refills | Status: DC | PRN

## 2016-04-19 MED ORDER — IBUPROFEN 800 MG PO TABS: 800.0000 mg | ORAL_TABLET | Freq: Three times a day (TID) | ORAL | 0 refills | Status: DC

## 2016-04-19 MED ORDER — BENZONATATE 100 MG PO CAPS: 200.0000 mg | ORAL_CAPSULE | Freq: Three times a day (TID) | ORAL | 0 refills | Status: DC | PRN

## 2016-04-19 MED ORDER — IBUPROFEN 800 MG PO TABS
800.0000 mg | ORAL_TABLET | Freq: Once | ORAL | Status: AC
  Administered 2016-04-19: 800 mg via ORAL
  Filled 2016-04-19: qty 1

## 2016-04-19 MED ORDER — BENZONATATE 100 MG PO CAPS
200.0000 mg | ORAL_CAPSULE | Freq: Once | ORAL | Status: AC
  Administered 2016-04-19: 200 mg via ORAL
  Filled 2016-04-19: qty 2

## 2016-04-19 NOTE — ED Triage Notes (Signed)
Patient c/o generalized body aches that started on Thursday. Per patient nonproductive cough with nasal congestion/sinus pressure. Patient reports increased pain with coughing in chest and left ribs. Patient unsure of any fevers. Per patient taking OTC cough syrup with no relief.

## 2016-04-19 NOTE — Discharge Instructions (Signed)
Rest and make sure you're drinking plenty of fluids.  Use the medications as prescribed.  Do not drive within 4 hours of taking the cough syrup as this will make you drowsy.  You may use the Tessalon pearls for cough relief when he needs to be awake like at work.  I recommend ibuprofen to help with chest wall pain.

## 2016-04-19 NOTE — ED Provider Notes (Signed)
[facility]-EMERGENCY DEPT Provider Note   CSN: [id number] Arrival date & time: 04/19/16  0743     History   Chief Complaint Chief Complaint  Patient presents with  . Generalized Body Aches    HPI Linda Skinner is a [age] y.o. female presenting with a 3 day history of uri type symptoms which includes nasal congestion with clear rhinorrhea, subjuctive fever with chills and hot flushes and nonproductive cough.  In addition she describes bilateral lower anterior chest pain triggered by coughing, sx free when not coughing.  Symptoms do not include shortness of breath, wheezing,  Nausea, vomiting, headache, sinus pain. She states her apartment currently has no heat as is awaiting repair, suspects this is making her feel worse.   The patient has taken nyquill prior to arrival which has helped her sleep..  The history is provided by the patient.    History reviewed. No pertinent past medical history.  There are no active problems to display for this patient.   Past Surgical History:  Procedure Laterality Date  . MOUTH SURGERY      OB History    Gravida Para Term Preterm AB Living   5 3 3   2 3    SAB TAB Ectopic Multiple Live Births                   Home Medications    Prior to Admission medications   Medication Sig Start Date End Date Taking? Authorizing Provider  benzonatate (TESSALON) 100 MG capsule Take 2 capsules (200 mg total) by mouth 3 (three) times daily as needed. 04/19/16   Burgess AmorJulie Nance Mccombs, PA-C  dextromethorphan (DELSYM) 30 MG/5ML liquid Take 30 mg by mouth at bedtime as needed for cough.    Historical Provider, MD  etonogestrel (NEXPLANON) 68 MG IMPL implant 1 each by Subdermal route once.    Historical Provider, MD  ibuprofen (ADVIL,MOTRIN) 800 MG tablet Take 1 tablet (800 mg total) by mouth 3 (three) times daily. 04/19/16   Burgess AmorJulie Heavenleigh Petruzzi, PA-C  promethazine-codeine (PHENERGAN WITH CODEINE) 6.25-10 MG/5ML syrup Take 5 mLs by mouth every 4 (four) hours as needed for cough.  04/19/16   Burgess AmorJulie Agnieszka Newhouse, PA-C  pseudoephedrine (SUDAFED) 60 MG tablet Take 1 tablet (60 mg total) by mouth every 6 (six) hours as needed for congestion. 07/23/15   Burgess AmorJulie Jazzmyn Filion, PA-C    Family History Family History  Problem Relation Age of Onset  . Lupus Mother     Social History Social History  Substance Use Topics  . Smoking status: Current Every Day Smoker    Packs/day: 0.01    Years: 4.00    Types: Cigars  . Smokeless tobacco: Never Used  . Alcohol use No     Allergies   Patient has no known allergies.   Review of Systems Review of Systems  Constitutional: Positive for chills and fever.  HENT: Positive for congestion and rhinorrhea. Negative for ear pain, sinus pressure, sore throat, trouble swallowing and voice change.   Eyes: Negative for discharge.  Respiratory: Positive for cough. Negative for shortness of breath, wheezing and stridor.   Cardiovascular: Negative for chest pain.  Gastrointestinal: Negative for abdominal pain.  Genitourinary: Negative.      Physical Exam Updated Vital Signs BP 131/74 (BP Location: Right Arm)   Pulse 89   Temp 98.2 F (36.8 C) (Oral)   Resp 18   Ht 5\' 4"  (1.626 m)   Wt 99.8 kg   LMP 03/15/2016   SpO2 100%  BMI 37.76 kg/m   Physical Exam  Constitutional: She is oriented to person, place, and time. She appears well-developed and well-nourished.  HENT:  Head: Normocephalic and atraumatic.  Right Ear: Tympanic membrane and ear canal normal.  Left Ear: Tympanic membrane and ear canal normal.  Nose: Mucosal edema present. No rhinorrhea.  Mouth/Throat: Uvula is midline, oropharynx is clear and moist and mucous membranes are normal. No oropharyngeal exudate, posterior oropharyngeal edema, posterior oropharyngeal erythema or tonsillar abscesses.  Eyes: Conjunctivae are normal.  Cardiovascular: Normal rate and normal heart sounds.   Pulmonary/Chest: Effort normal. No respiratory distress. She has no wheezes. She has no rales. She  exhibits no tenderness.  Abdominal: Soft. There is no tenderness.  Musculoskeletal: Normal range of motion.  Neurological: She is alert and oriented to person, place, and time.  Skin: Skin is warm and dry. No rash noted.  Psychiatric: She has a normal mood and affect.     ED Treatments / Results  Labs (all labs ordered are listed, but only abnormal results are displayed) Labs Reviewed  POC URINE PREG, ED    EKG  EKG Interpretation None       Radiology Dg Chest 2 View  Result Date: 04/19/2016 CLINICAL DATA:  Cough, chest pain for 4 days EXAM: CHEST  2 VIEW COMPARISON:  None. FINDINGS: The heart size and mediastinal contours are within normal limits. Both lungs are clear. The visualized skeletal structures are unremarkable. IMPRESSION: No active cardiopulmonary disease. Electronically Signed   By: Charlett NoseKevin  Dover M.D.   On: 04/19/2016 08:37    Procedures Procedures (including critical care time)  Medications Ordered in ED Medications  ibuprofen (ADVIL,MOTRIN) tablet 800 mg (800 mg Oral Given 04/19/16 0902)  benzonatate (TESSALON) capsule 200 mg (200 mg Oral Given 04/19/16 0902)     Initial Impression / Assessment and Plan / ED Course  I have reviewed the triage vital signs and the nursing notes.  Pertinent labs & imaging results that were available during my care of the patient were reviewed by me and considered in my medical decision making (see chart for details).  Clinical Course     Patient with symptoms suggesting viral URI/bronchitis.  Advised rest, fluid intake, medications for symptom relief.  Parent follow-up with PCP if symptoms worsen or do not improve.  Patient has no respiratory distress or compromise at this time.  Final Clinical Impressions(s) / ED Diagnoses   Final diagnoses:  Acute bronchitis, unspecified organism    New Prescriptions New Prescriptions   BENZONATATE (TESSALON) 100 MG CAPSULE    Take 2 capsules (200 mg total) by mouth 3 (three)  times daily as needed.   IBUPROFEN (ADVIL,MOTRIN) 800 MG TABLET    Take 1 tablet (800 mg total) by mouth 3 (three) times daily.   PROMETHAZINE-CODEINE (PHENERGAN WITH CODEINE) 6.25-10 MG/5ML SYRUP    Take 5 mLs by mouth every 4 (four) hours as needed for cough.     Burgess AmorJulie Gerardo Territo, PA-C 04/19/16 [id number]    Donnetta HutchingBrian Cook, MD 04/20/16 1003

## 2016-10-21 ENCOUNTER — Other Ambulatory Visit (HOSPITAL_COMMUNITY): Payer: Self-pay | Admitting: Family

## 2016-10-21 DIAGNOSIS — N6320 Unspecified lump in the left breast, unspecified quadrant: Secondary | ICD-10-CM

## 2016-10-27 ENCOUNTER — Ambulatory Visit (HOSPITAL_COMMUNITY)
Admission: RE | Admit: 2016-10-27 | Discharge: 2016-10-27 | Disposition: A | Discharge status: Home / Self Care | Payer: Medicaid Other | Source: Ambulatory Visit | Attending: Family | Admitting: Family

## 2016-10-27 DIAGNOSIS — N6320 Unspecified lump in the left breast, unspecified quadrant: Secondary | ICD-10-CM | POA: Insufficient documentation

## 2016-10-31 ENCOUNTER — Emergency Department (HOSPITAL_COMMUNITY)
Admission: EM | Admit: 2016-10-31 | Discharge: 2016-10-31 | Disposition: A | Discharge status: Home / Self Care | Payer: Medicaid Other | Attending: Emergency Medicine | Admitting: Emergency Medicine

## 2016-10-31 ENCOUNTER — Encounter (HOSPITAL_COMMUNITY): Payer: Self-pay

## 2016-10-31 DIAGNOSIS — N611 Abscess of the breast and nipple: Secondary | ICD-10-CM | POA: Insufficient documentation

## 2016-10-31 DIAGNOSIS — F17290 Nicotine dependence, other tobacco product, uncomplicated: Secondary | ICD-10-CM | POA: Insufficient documentation

## 2016-10-31 DIAGNOSIS — Z793 Long term (current) use of hormonal contraceptives: Secondary | ICD-10-CM | POA: Insufficient documentation

## 2016-10-31 DIAGNOSIS — N644 Mastodynia: Secondary | ICD-10-CM | POA: Diagnosis present

## 2016-10-31 MED ORDER — SULFAMETHOXAZOLE-TRIMETHOPRIM 800-160 MG PO TABS: 1.0000 | ORAL_TABLET | Freq: Two times a day (BID) | ORAL | 0 refills | Status: AC

## 2016-10-31 NOTE — Discharge Instructions (Signed)
Follow up with dr.  Lovell SheehanJenkins in a week for recheck

## 2016-10-31 NOTE — ED Triage Notes (Signed)
Pt has a mass on left breast. Has been there since May 16.  Came in today due to increasing pain and size of mass. No drainage noted, just extremely painful

## 2016-10-31 NOTE — ED Provider Notes (Signed)
[facility] Provider Note   CSN: [id number] Arrival date & time: 10/31/16  0709     History   Chief Complaint Chief Complaint  Patient presents with  . Breast Mass    HPI Linda Skinner is a 26 y.o. female.  Pt complains of pain in the left breast   The history is provided by the patient.  Abscess  Abscess location: left breast. Abscess quality: painful   Progression:  Worsening Pain details:    Quality:  Aching   Severity:  Moderate   Timing:  Constant Chronicity:  New Associated symptoms: no fatigue and no headaches     History reviewed. No pertinent past medical history.  There are no active problems to display for this patient.   Past Surgical History:  Procedure Laterality Date  . MOUTH SURGERY      OB History    Gravida Para Term Preterm AB Living   5 3 3   2 3    SAB TAB Ectopic Multiple Live Births                   Home Medications    Prior to Admission medications   Medication Sig Start Date End Date Taking? Authorizing Provider  benzonatate (TESSALON) 100 MG capsule Take 2 capsules (200 mg total) by mouth 3 (three) times daily as needed. 04/19/16   Burgess AmorIdol, Julie, PA-C  dextromethorphan (DELSYM) 30 MG/5ML liquid Take 30 mg by mouth at bedtime as needed for cough.    [provider]  etonogestrel (NEXPLANON) 68 MG IMPL implant 1 each by Subdermal route once.    [provider]  ibuprofen (ADVIL,MOTRIN) 800 MG tablet Take 1 tablet (800 mg total) by mouth 3 (three) times daily. 04/19/16   Burgess AmorIdol, Julie, PA-C  promethazine-codeine (PHENERGAN WITH CODEINE) 6.25-10 MG/5ML syrup Take 5 mLs by mouth every 4 (four) hours as needed for cough. 04/19/16   Burgess AmorIdol, Julie, PA-C  pseudoephedrine (SUDAFED) 60 MG tablet Take 1 tablet (60 mg total) by mouth every 6 (six) hours as needed for congestion. 07/23/15   Burgess AmorIdol, Julie, PA-C  sulfamethoxazole-trimethoprim (BACTRIM DS,SEPTRA DS) 800-160 MG tablet Take 1 tablet by mouth 2 (two) times daily.  10/31/16 11/07/16  Bethann BerkshireZammit, Raye Wiens, MD    Family History Family History  Problem Relation Age of Onset  . Lupus Mother     Social History Social History  Substance Use Topics  . Smoking status: Current Every Day Smoker    Packs/day: 0.01    Years: 4.00    Types: Cigars  . Smokeless tobacco: Never Used  . Alcohol use No     Allergies   Patient has no known allergies.   Review of Systems Review of Systems  Constitutional: Negative for appetite change and fatigue.  HENT: Negative for congestion, ear discharge and sinus pressure.   Eyes: Negative for discharge.  Respiratory: Negative for cough.   Cardiovascular: Negative for chest pain.  Gastrointestinal: Negative for abdominal pain and diarrhea.  Genitourinary: Negative for frequency and hematuria.  Musculoskeletal: Negative for back pain.  Skin: Negative for rash.       Tender left breast  Neurological: Negative for seizures and headaches.  Psychiatric/Behavioral: Negative for hallucinations.     Physical Exam Updated Vital Signs BP (!) 126/56   Pulse 82   Temp 98.1 F (36.7 C) (Oral)   Resp 18   Ht 5\' 4"  (1.626 m)   Wt 111.1 kg (245 lb)   LMP 10/12/2016   SpO2 97%  BMI 42.05 kg/m   Physical Exam  Constitutional: She is oriented to person, place, and time. She appears well-developed.  HENT:  Head: Normocephalic.  Eyes: Conjunctivae and EOM are normal. No scleral icterus.  Neck: Neck supple. No thyromegaly present.  Cardiovascular: Normal rate and regular rhythm.  Exam reveals no gallop and no friction rub.   No murmur heard. Pulmonary/Chest: No stridor. She has no wheezes. She has no rales. She exhibits no tenderness.  Abdominal: She exhibits no distension. There is no tenderness. There is no rebound.  Musculoskeletal: Normal range of motion. She exhibits no edema.  Lymphadenopathy:    She has no cervical adenopathy.  Neurological: She is oriented to person, place, and time. She exhibits normal muscle  tone. Coordination normal.  Skin: No rash noted. No erythema.  Tender left breast mass about 2 cm at 9 oclock  Psychiatric: She has a normal mood and affect. Her behavior is normal.     ED Treatments / Results  Labs (all labs ordered are listed, but only abnormal results are displayed) Labs Reviewed - No data to display  EKG  EKG Interpretation None       Radiology No results found.  Procedures Procedures (including critical care time)  Medications Ordered in ED Medications - No data to display   Initial Impression / Assessment and Plan / ED Course  I have reviewed the triage vital signs and the nursing notes.  Pertinent labs & imaging results that were available during my care of the patient were reviewed by me and considered in my medical decision making (see chart for details).     Pt with infected left breast.  rx with bactrim and follow up with surgery  Final Clinical Impressions(s) / ED Diagnoses   Final diagnoses:  Breast abscess    New Prescriptions New Prescriptions   SULFAMETHOXAZOLE-TRIMETHOPRIM (BACTRIM DS,SEPTRA DS) 800-160 MG TABLET    Take 1 tablet by mouth 2 (two) times daily.     Bethann Berkshire, MD 10/31/16 [phone]

## 2016-11-02 ENCOUNTER — Other Ambulatory Visit (HOSPITAL_COMMUNITY): Payer: Self-pay | Admitting: Family

## 2016-11-02 DIAGNOSIS — N6320 Unspecified lump in the left breast, unspecified quadrant: Secondary | ICD-10-CM

## 2016-11-03 ENCOUNTER — Emergency Department (HOSPITAL_COMMUNITY)
Admission: EM | Admit: 2016-11-03 | Discharge: 2016-11-03 | Disposition: A | Discharge status: Home / Self Care | Payer: Medicaid Other | Attending: Dermatology | Admitting: Dermatology

## 2016-11-03 ENCOUNTER — Encounter (HOSPITAL_COMMUNITY): Payer: Self-pay | Admitting: Emergency Medicine

## 2016-11-03 DIAGNOSIS — Z5321 Procedure and treatment not carried out due to patient leaving prior to being seen by health care provider: Secondary | ICD-10-CM | POA: Diagnosis not present

## 2016-11-03 DIAGNOSIS — F17290 Nicotine dependence, other tobacco product, uncomplicated: Secondary | ICD-10-CM | POA: Diagnosis not present

## 2016-11-03 DIAGNOSIS — N644 Mastodynia: Secondary | ICD-10-CM | POA: Diagnosis present

## 2016-11-03 NOTE — ED Notes (Signed)
Per registration pt left facility. 

## 2016-11-03 NOTE — ED Triage Notes (Signed)
Pt c/o increased left breast pain from mass.

## 2016-11-10 ENCOUNTER — Ambulatory Visit (HOSPITAL_COMMUNITY)
Admission: RE | Admit: 2016-11-10 | Discharge: 2016-11-10 | Disposition: A | Discharge status: Home / Self Care | Payer: Medicaid Other | Source: Ambulatory Visit | Attending: Family | Admitting: Family

## 2016-11-10 ENCOUNTER — Encounter (HOSPITAL_COMMUNITY): Payer: Self-pay

## 2016-11-10 ENCOUNTER — Other Ambulatory Visit (HOSPITAL_COMMUNITY): Payer: Self-pay | Admitting: Family

## 2016-11-10 DIAGNOSIS — L0291 Cutaneous abscess, unspecified: Secondary | ICD-10-CM

## 2016-12-04 ENCOUNTER — Emergency Department (HOSPITAL_COMMUNITY)
Admission: EM | Admit: 2016-12-04 | Discharge: 2016-12-04 | Disposition: A | Discharge status: Home / Self Care | Payer: Medicaid Other | Attending: Emergency Medicine | Admitting: Emergency Medicine

## 2016-12-04 ENCOUNTER — Encounter (HOSPITAL_COMMUNITY): Payer: Self-pay

## 2016-12-04 DIAGNOSIS — F17200 Nicotine dependence, unspecified, uncomplicated: Secondary | ICD-10-CM | POA: Diagnosis not present

## 2016-12-04 DIAGNOSIS — N899 Noninflammatory disorder of vagina, unspecified: Secondary | ICD-10-CM | POA: Insufficient documentation

## 2016-12-04 DIAGNOSIS — N898 Other specified noninflammatory disorders of vagina: Secondary | ICD-10-CM

## 2016-12-04 LAB — URINALYSIS, ROUTINE W REFLEX MICROSCOPIC
Bilirubin Urine: NEGATIVE
Glucose, UA: NEGATIVE mg/dL
HGB URINE DIPSTICK: NEGATIVE
KETONES UR: NEGATIVE mg/dL
Nitrite: NEGATIVE
PROTEIN: NEGATIVE mg/dL
Specific Gravity, Urine: 1.026 (ref 1.005–1.030)
pH: 6 (ref 5.0–8.0)

## 2016-12-04 LAB — WET PREP, GENITAL
CLUE CELLS WET PREP: NONE SEEN
Sperm: NONE SEEN
Trich, Wet Prep: NONE SEEN
YEAST WET PREP: NONE SEEN

## 2016-12-04 LAB — PREGNANCY, URINE: Preg Test, Ur: NEGATIVE

## 2016-12-04 NOTE — ED Provider Notes (Signed)
[facility] Provider Note   CSN: [id number] Arrival date & time: 12/04/16  0144     History   Chief Complaint Chief Complaint  Patient presents with  . Vaginal Itching    HPI Linda Skinner is a 26 y.o. female.  Patient presents with one week of vaginal itching and white discharge. Denies any pain. Denies any dysuria hematuria. No abdominal pain, vomiting, diarrhea or constipation. Has not tried any treatments at home. She is concerned that she has a yeast infection.   The history is provided by the patient.  Vaginal Itching  Pertinent negatives include no chest pain, no abdominal pain, no headaches and no shortness of breath.    History reviewed. No pertinent past medical history.  There are no active problems to display for this patient.   Past Surgical History:  Procedure Laterality Date  . MOUTH SURGERY      OB History    Gravida Para Term Preterm AB Living   5 3 3   2 3    SAB TAB Ectopic Multiple Live Births                   Home Medications    Prior to Admission medications   Medication Sig Start Date End Date Taking? Authorizing Provider  benzonatate (TESSALON) 100 MG capsule Take 2 capsules (200 mg total) by mouth 3 (three) times daily as needed. 04/19/16   Burgess Amor, PA-C  dextromethorphan (DELSYM) 30 MG/5ML liquid Take 30 mg by mouth at bedtime as needed for cough.    [provider]  etonogestrel (NEXPLANON) 68 MG IMPL implant 1 each by Subdermal route once.    [provider]  ibuprofen (ADVIL,MOTRIN) 800 MG tablet Take 1 tablet (800 mg total) by mouth 3 (three) times daily. 04/19/16   Burgess Amor, PA-C  promethazine-codeine (PHENERGAN WITH CODEINE) 6.25-10 MG/5ML syrup Take 5 mLs by mouth every 4 (four) hours as needed for cough. 04/19/16   Burgess Amor, PA-C  pseudoephedrine (SUDAFED) 60 MG tablet Take 1 tablet (60 mg total) by mouth every 6 (six) hours as needed for congestion. 07/23/15   Burgess Amor, PA-C    Family  History Family History  Problem Relation Age of Onset  . Lupus Mother     Social History Social History  Substance Use Topics  . Smoking status: Current Every Day Smoker    Packs/day: 0.01    Years: 4.00    Types: Cigars  . Smokeless tobacco: Never Used  . Alcohol use No     Allergies   Patient has no known allergies.   Review of Systems Review of Systems  Constitutional: Negative for activity change, appetite change and fever.  HENT: Negative for congestion and rhinorrhea.   Respiratory: Negative for cough, chest tightness and shortness of breath.   Cardiovascular: Negative for chest pain.  Gastrointestinal: Negative for abdominal pain, nausea and vomiting.  Genitourinary: Positive for vaginal discharge. Negative for dysuria, hematuria and vaginal pain.  Musculoskeletal: Negative for arthralgias and myalgias.  Skin: Negative for rash.  Neurological: Negative for dizziness, weakness and headaches.   all other systems are negative except as noted in the HPI and PMH.     Physical Exam Updated Vital Signs BP 129/70 (BP Location: Left Arm)   Pulse 73   Temp 98.2 F (36.8 C) (Oral)   Resp 14   Ht 5\' 4"  (1.626 m)   Wt 108.9 kg (240 lb)   LMP 11/09/2016 (Approximate)   SpO2 98%  BMI 41.20 kg/m   Physical Exam  Constitutional: She is oriented to person, place, and time. She appears well-developed and well-nourished. No distress.  HENT:  Head: Normocephalic and atraumatic.  Mouth/Throat: Oropharynx is clear and moist. No oropharyngeal exudate.  Eyes: Conjunctivae and EOM are normal. Pupils are equal, round, and reactive to light.  Neck: Normal range of motion. Neck supple.  No meningismus.  Cardiovascular: Normal rate, regular rhythm, normal heart sounds and intact distal pulses.   No murmur heard. Pulmonary/Chest: Effort normal and breath sounds normal. No respiratory distress.  Abdominal: Soft. There is no tenderness. There is no rebound and no guarding.    Genitourinary: Vaginal discharge found.  Genitourinary Comments: Chaperone present. Normal external genitalia. No CMT. No lateralizing adnexal tenderness  Musculoskeletal: Normal range of motion. She exhibits no edema or tenderness.  Neurological: She is alert and oriented to person, place, and time. No cranial nerve deficit. She exhibits normal muscle tone. Coordination normal.   5/5 strength throughout. CN 2-12 intact.Equal grip strength.   Skin: Skin is warm.  Psychiatric: She has a normal mood and affect. Her behavior is normal.  Nursing note and vitals reviewed.    ED Treatments / Results  Labs (all labs ordered are listed, but only abnormal results are displayed) Labs Reviewed  WET PREP, GENITAL - Abnormal; Notable for the following:       Result Value   WBC, Wet Prep HPF POC FEW (*)    All other components within normal limits  URINALYSIS, ROUTINE W REFLEX MICROSCOPIC - Abnormal; Notable for the following:    APPearance HAZY (*)    Leukocytes, UA TRACE (*)    Bacteria, UA RARE (*)    Squamous Epithelial / LPF 6-30 (*)    All other components within normal limits  PREGNANCY, URINE  GC/CHLAMYDIA PROBE AMP ([place]    EKG  EKG Interpretation None       Radiology No results found.  Procedures Procedures (including critical care time)  Medications Ordered in ED Medications - No data to display   Initial Impression / Assessment and Plan / ED Course  I have reviewed the triage vital signs and the nursing notes.  Pertinent labs & imaging results that were available during my care of the patient were reviewed by me and considered in my medical decision making (see chart for details).     Several days of vaginal discharge. No abdominal pain or fever.  No yeast seen on pelvic exam. Wet prep is negative. HCG is negative and urinalysis is negative.  Follow-up with gynecology. Return precautions discussed  Final Clinical Impressions(s) / ED  Diagnoses   Final diagnoses:  Vaginal discharge    New Prescriptions New Prescriptions   No medications on file     Glynn Octaveancour, Driana Dazey, MD 12/04/16 [phone]

## 2016-12-04 NOTE — Discharge Instructions (Signed)
No yeast infection seen. Your STD tests are pending. Follow up with the gynecologist. Return to the ED if you develop new or worsening symptoms.

## 2016-12-04 NOTE — ED Triage Notes (Signed)
Vaginal itching and thick white discharge x 1 week, states she also wants a pregnancy test although she is not late for her menstrual cycle.

## 2016-12-07 LAB — GC/CHLAMYDIA PROBE AMP (~~LOC~~) NOT AT ARMC
CHLAMYDIA, DNA PROBE: NEGATIVE
NEISSERIA GONORRHEA: NEGATIVE

## 2017-01-13 ENCOUNTER — Emergency Department (HOSPITAL_COMMUNITY)
Admission: EM | Admit: 2017-01-13 | Discharge: 2017-01-13 | Disposition: A | Discharge status: Home / Self Care | Payer: Medicaid Other | Attending: Emergency Medicine | Admitting: Emergency Medicine

## 2017-01-13 ENCOUNTER — Encounter (HOSPITAL_COMMUNITY): Payer: Self-pay | Admitting: Cardiology

## 2017-01-13 DIAGNOSIS — R1084 Generalized abdominal pain: Secondary | ICD-10-CM | POA: Insufficient documentation

## 2017-01-13 DIAGNOSIS — Z5321 Procedure and treatment not carried out due to patient leaving prior to being seen by health care provider: Secondary | ICD-10-CM | POA: Insufficient documentation

## 2017-01-13 LAB — URINALYSIS, ROUTINE W REFLEX MICROSCOPIC
Bacteria, UA: NONE SEEN
Bilirubin Urine: NEGATIVE
GLUCOSE, UA: NEGATIVE mg/dL
HGB URINE DIPSTICK: NEGATIVE
KETONES UR: NEGATIVE mg/dL
NITRITE: NEGATIVE
PROTEIN: 30 mg/dL — AB
Specific Gravity, Urine: 1.026 (ref 1.005–1.030)
pH: 5 (ref 5.0–8.0)

## 2017-01-13 LAB — PREGNANCY, URINE: Preg Test, Ur: POSITIVE — AB

## 2017-01-13 NOTE — ED Triage Notes (Signed)
Lower abdominal pain times week.

## 2017-01-20 ENCOUNTER — Ambulatory Visit (INDEPENDENT_AMBULATORY_CARE_PROVIDER_SITE_OTHER): Payer: Medicaid Other | Admitting: Adult Health

## 2017-01-20 ENCOUNTER — Encounter (INDEPENDENT_AMBULATORY_CARE_PROVIDER_SITE_OTHER): Payer: Self-pay

## 2017-01-20 ENCOUNTER — Encounter: Payer: Self-pay | Admitting: Adult Health

## 2017-01-20 VITALS — BP 112/70 | HR 74 | Ht 65.0 in | Wt 235.0 lb

## 2017-01-20 DIAGNOSIS — Z3201 Encounter for pregnancy test, result positive: Secondary | ICD-10-CM | POA: Diagnosis not present

## 2017-01-20 DIAGNOSIS — O3680X0 Pregnancy with inconclusive fetal viability, not applicable or unspecified: Secondary | ICD-10-CM

## 2017-01-20 DIAGNOSIS — N926 Irregular menstruation, unspecified: Secondary | ICD-10-CM | POA: Diagnosis not present

## 2017-01-20 DIAGNOSIS — Z3A01 Less than 8 weeks gestation of pregnancy: Secondary | ICD-10-CM

## 2017-01-20 LAB — POCT URINE PREGNANCY: Preg Test, Ur: POSITIVE — AB

## 2017-01-20 MED ORDER — PRENATAL PLUS 27-1 MG PO TABS: 1.0000 | ORAL_TABLET | Freq: Every day | ORAL | 12 refills | Status: DC

## 2017-01-20 NOTE — Patient Instructions (Addendum)
First Trimester of Pregnancy The first trimester of pregnancy is from week 1 until the end of week 13 (months 1 through 3). A week after a sperm fertilizes an egg, the egg will implant on the wall of the uterus. This embryo will begin to develop into a baby. Genes from you and your partner will form the baby. The female genes will determine whether the baby will be a boy or a girl. At 6-8 weeks, the eyes and face will be formed, and the heartbeat can be seen on ultrasound. At the end of 12 weeks, all the baby's organs will be formed. Now that you are pregnant, you will want to do everything you can to have a healthy baby. Two of the most important things are to get good prenatal care and to follow your health care provider's instructions. Prenatal care is all the medical care you receive before the baby's birth. This care will help prevent, find, and treat any problems during the pregnancy and childbirth. Body changes during your first trimester Your body goes through many changes during pregnancy. The changes vary from woman to woman.  You may gain or lose a couple of pounds at first.  You may feel sick to your stomach (nauseous) and you may throw up (vomit). If the vomiting is uncontrollable, call your health care provider.  You may tire easily.  You may develop headaches that can be relieved by medicines. All medicines should be approved by your health care provider.  You may urinate more often. Painful urination may mean you have a bladder infection.  You may develop heartburn as a result of your pregnancy.  You may develop constipation because certain hormones are causing the muscles that push stool through your intestines to slow down.  You may develop hemorrhoids or swollen veins (varicose veins).  Your breasts may begin to grow larger and become tender. Your nipples may stick out more, and the tissue that surrounds them (areola) may become darker.  Your gums may bleed and may be  sensitive to brushing and flossing.  Dark spots or blotches (chloasma, mask of pregnancy) may develop on your face. This will likely fade after the baby is born.  Your menstrual periods will stop.  You may have a loss of appetite.  You may develop cravings for certain kinds of food.  You may have changes in your emotions from day to day, such as being excited to be pregnant or being concerned that something may go wrong with the pregnancy and baby.  You may have more vivid and strange dreams.  You may have changes in your hair. These can include thickening of your hair, rapid growth, and changes in texture. Some women also have hair loss during or after pregnancy, or hair that feels dry or thin. Your hair will most likely return to normal after your baby is born.  What to expect at prenatal visits During a routine prenatal visit:  You will be weighed to make sure you and the baby are growing normally.  Your blood pressure will be taken.  Your abdomen will be measured to track your baby's growth.  The fetal heartbeat will be listened to between weeks 10 and 14 of your pregnancy.  Test results from any previous visits will be discussed.  Your health care provider may ask you:  How you are feeling.  If you are feeling the baby move.  If you have had any abnormal symptoms, such as leaking fluid, bleeding, severe headaches,   or abdominal cramping.  If you are using any tobacco products, including cigarettes, chewing tobacco, and electronic cigarettes.  If you have any questions.  Other tests that may be performed during your first trimester include:  Blood tests to find your blood type and to check for the presence of any previous infections. The tests will also be used to check for low iron levels (anemia) and protein on red blood cells (Rh antibodies). Depending on your risk factors, or if you previously had diabetes during pregnancy, you may have tests to check for high blood  sugar that affects pregnant women (gestational diabetes).  Urine tests to check for infections, diabetes, or protein in the urine.  An ultrasound to confirm the proper growth and development of the baby.  Fetal screens for spinal cord problems (spina bifida) and Down syndrome.  HIV (human immunodeficiency virus) testing. Routine prenatal testing includes screening for HIV, unless you choose not to have this test.  You may need other tests to make sure you and the baby are doing well.  Follow these instructions at home: Medicines  Follow your health care provider's instructions regarding medicine use. Specific medicines may be either safe or unsafe to take during pregnancy.  Take a prenatal vitamin that contains at least 600 micrograms (mcg) of folic acid.  If you develop constipation, try taking a stool softener if your health care provider approves. Eating and drinking  Eat a balanced diet that includes fresh fruits and vegetables, whole grains, good sources of protein such as meat, eggs, or tofu, and low-fat dairy. Your health care provider will help you determine the amount of weight gain that is right for you.  Avoid raw meat and uncooked cheese. These carry germs that can cause birth defects in the baby.  Eating four or five small meals rather than three large meals a day may help relieve nausea and vomiting. If you start to feel nauseous, eating a few soda crackers can be helpful. Drinking liquids between meals, instead of during meals, also seems to help ease nausea and vomiting.  Limit foods that are high in fat and processed sugars, such as fried and sweet foods.  To prevent constipation: ? Eat foods that are high in fiber, such as fresh fruits and vegetables, whole grains, and beans. ? Drink enough fluid to keep your urine clear or pale yellow. Activity  Exercise only as directed by your health care provider. Most women can continue their usual exercise routine during  pregnancy. Try to exercise for 30 minutes at least 5 days a week. Exercising will help you: ? Control your weight. ? Stay in shape. ? Be prepared for labor and delivery.  Experiencing pain or cramping in the lower abdomen or lower back is a good sign that you should stop exercising. Check with your health care provider before continuing with normal exercises.  Try to avoid standing for long periods of time. Move your legs often if you must stand in one place for a long time.  Avoid heavy lifting.  Wear low-heeled shoes and practice good posture.  You may continue to have sex unless your health care provider tells you not to. Relieving pain and discomfort  Wear a good support bra to relieve breast tenderness.  Take warm sitz baths to soothe any pain or discomfort caused by hemorrhoids. Use hemorrhoid cream if your health care provider approves.  Rest with your legs elevated if you have leg cramps or low back pain.  If you develop   varicose veins in your legs, wear support hose. Elevate your feet for 15 minutes, 3-4 times a day. Limit salt in your diet. Prenatal care  Schedule your prenatal visits by the twelfth week of pregnancy. They are usually scheduled monthly at first, then more often in the last 2 months before delivery.  Write down your questions. Take them to your prenatal visits.  Keep all your prenatal visits as told by your health care provider. This is important. Safety  Wear your seat belt at all times when driving.  Make a list of emergency phone numbers, including numbers for family, friends, the hospital, and police and fire departments. General instructions  Ask your health care provider for a referral to a local prenatal education class. Begin classes no later than the beginning of month 6 of your pregnancy.  Ask for help if you have counseling or nutritional needs during pregnancy. Your health care provider can offer advice or refer you to specialists for help  with various needs.  Do not use hot tubs, steam rooms, or saunas.  Do not douche or use tampons or scented sanitary pads.  Do not cross your legs for long periods of time.  Avoid cat litter boxes and soil used by cats. These carry germs that can cause birth defects in the baby and possibly loss of the fetus by miscarriage or stillbirth.  Avoid all smoking, herbs, alcohol, and medicines not prescribed by your health care provider. Chemicals in these products affect the formation and growth of the baby.  Do not use any products that contain nicotine or tobacco, such as cigarettes and e-cigarettes. If you need help quitting, ask your health care provider. You may receive counseling support and other resources to help you quit.  Schedule a dentist appointment. At home, brush your teeth with a soft toothbrush and be gentle when you floss. Contact a health care provider if:  You have dizziness.  You have mild pelvic cramps, pelvic pressure, or nagging pain in the abdominal area.  You have persistent nausea, vomiting, or diarrhea.  You have a bad smelling vaginal discharge.  You have pain when you urinate.  You notice increased swelling in your face, hands, legs, or ankles.  You are exposed to fifth disease or chickenpox.  You are exposed to German measles (rubella) and have never had it. Get help right away if:  You have a fever.  You are leaking fluid from your vagina.  You have spotting or bleeding from your vagina.  You have severe abdominal cramping or pain.  You have rapid weight gain or loss.  You vomit blood or material that looks like coffee grounds.  You develop a severe headache.  You have shortness of breath.  You have any kind of trauma, such as from a fall or a car accident. Summary  The first trimester of pregnancy is from week 1 until the end of week 13 (months 1 through 3).  Your body goes through many changes during pregnancy. The changes vary from  woman to woman.  You will have routine prenatal visits. During those visits, your health care provider will examine you, discuss any test results you may have, and talk with you about how you are feeling. This information is not intended to replace advice given to you by your health care provider. Make sure you discuss any questions you have with your health care provider. Document Released: 05/12/2001 Document Revised: 04/29/2016 Document Reviewed: 04/29/2016 Elsevier Interactive Patient Education  2017 [employer] [place] in 2 weeks

## 2017-01-20 NOTE — Progress Notes (Signed)
Subjective:     Patient ID: Linda Skinner, female   DOB: 01-10-1991, 26 y.o.   MRN: [id number]  HPI  Linda Skinner is a 26 year old black female in for UPT, has missed a period and had 2+HPT. Has 3 girls.    Review of Systems +missed period with +HPT Had pain right ovary area last week, none now Reviewed past medical,surgical, social and family history. Reviewed medications and allergies.     Objective:   Physical Exam BP 112/70 (BP Location: Left Arm, Patient Position: Sitting, Cuff Size: Large)   Pulse 74   Ht 5\' 5"  (1.651 m)   Wt 235 lb (106.6 kg)   LMP 12/15/2016 (Approximate)   BMI 39.11 kg/m UPT +, about 5+1 week by LMP with EDD 09/21/17. Skin warm and dry. Neck: mid line trachea, normal thyroid, good ROM, no lymphadenopathy noted. Lungs: clear to ausculation bilaterally. Cardiovascular: regular rate and rhythm.Abdomen is soft and non tender.PHQ 2 score 0. 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     Assessment:     1. Pregnancy examination or test, positive result   2. Less than [redacted] weeks gestation of pregnancy   3. Encounter to determine fetal viability of pregnancy, single or unspecified fetus       Plan:     Meds ordered this encounter  Medications  . prenatal vitamin w/FE, FA (PRENATAL 1 + 1) 27-1 MG TABS tablet    Sig: Take 1 tablet by mouth daily at 12 noon.    Dispense:  30 each    Refill:  12    Order Specific Question:   Supervising Provider    Answer:   Lazaro Arms [[id number]]  Return in 2 weeks for dating [place] Review handout on first trimester and by [facility]

## 2017-02-03 ENCOUNTER — Ambulatory Visit (INDEPENDENT_AMBULATORY_CARE_PROVIDER_SITE_OTHER): Payer: Medicaid Other

## 2017-02-03 ENCOUNTER — Other Ambulatory Visit: Payer: Self-pay | Admitting: Adult Health

## 2017-02-03 DIAGNOSIS — N83202 Unspecified ovarian cyst, left side: Secondary | ICD-10-CM | POA: Diagnosis not present

## 2017-02-03 DIAGNOSIS — O3680X0 Pregnancy with inconclusive fetal viability, not applicable or unspecified: Secondary | ICD-10-CM

## 2017-02-03 NOTE — Progress Notes (Signed)
US 7+1 wks,single IUP w/ys,pos fht 141 bpm,normal right ovary,simple left ovarian cyst w/ arterial and venous flow  6.3 x 5.9 x 4.6 cm,crl 10.39,EDD 09/21/2017

## 2017-02-17 ENCOUNTER — Ambulatory Visit (INDEPENDENT_AMBULATORY_CARE_PROVIDER_SITE_OTHER): Payer: Medicaid Other | Admitting: Women's Health

## 2017-02-17 ENCOUNTER — Ambulatory Visit: Payer: Medicaid Other | Admitting: *Deleted

## 2017-02-17 ENCOUNTER — Encounter: Payer: Self-pay | Admitting: Women's Health

## 2017-02-17 VITALS — BP 108/60 | HR 102 | Wt 231.6 lb

## 2017-02-17 DIAGNOSIS — Z1389 Encounter for screening for other disorder: Secondary | ICD-10-CM

## 2017-02-17 DIAGNOSIS — Z3A09 9 weeks gestation of pregnancy: Secondary | ICD-10-CM

## 2017-02-17 DIAGNOSIS — Z3481 Encounter for supervision of other normal pregnancy, first trimester: Secondary | ICD-10-CM | POA: Diagnosis not present

## 2017-02-17 DIAGNOSIS — Z23 Encounter for immunization: Secondary | ICD-10-CM

## 2017-02-17 DIAGNOSIS — O0990 Supervision of high risk pregnancy, unspecified, unspecified trimester: Secondary | ICD-10-CM | POA: Insufficient documentation

## 2017-02-17 DIAGNOSIS — Z3682 Encounter for antenatal screening for nuchal translucency: Secondary | ICD-10-CM | POA: Diagnosis not present

## 2017-02-17 DIAGNOSIS — Z331 Pregnant state, incidental: Secondary | ICD-10-CM

## 2017-02-17 HISTORY — DX: Supervision of high risk pregnancy, unspecified, unspecified trimester: O09.90

## 2017-02-17 LAB — POCT URINALYSIS DIPSTICK
Blood, UA: NEGATIVE
Glucose, UA: NEGATIVE
KETONES UA: NEGATIVE
Leukocytes, UA: NEGATIVE
Nitrite, UA: NEGATIVE
PROTEIN UA: NEGATIVE

## 2017-02-17 MED ORDER — DOXYLAMINE-PYRIDOXINE ER 20-20 MG PO TBCR
1.0000 | EXTENDED_RELEASE_TABLET | Freq: Every day | ORAL | 8 refills | Status: DC
Start: 2017-02-17 — End: 2017-05-26

## 2017-02-17 NOTE — Progress Notes (Signed)
[facility] Initial OB Visit  Patient name: Linda Skinner MRN [id number]  Date of birth: [birth year] CC & HPI:  Linda Skinner is a 26 y.o. [id number] African American female at [redacted]w[redacted]d by LMP c/w 7wk u/s, with an Estimated Date of Delivery: 09/21/17 being seen today for her initial obstetrical visit. Her obstetrical history is significant for term uncomplicated SVB x 3, Ab x 2.  Today she reports n/v- requests nausea med.  Review of Systems:   Denies cramping/contractions, leakage of fluid, vaginal bleeding, abnormal vaginal discharge w/ itching/odor/irritation, headaches, visual changes, shortness of breath, chest pain, abdominal pain, severe nausea/vomiting, or problems with urination or bowel movements.   Pertinent History Reviewed:   OB History  Gravida Para Term Preterm AB Living  SAB TAB Ectopic Multiple Live Births          3    # Outcome Date GA Lbr Len/2nd Weight Sex Delivery Anes PTL Lv  6 Current           5 AB 05/2014          4 AB 11/2013          3 Term 10/02/13 [redacted]w[redacted]d  7 lb 3 oz (3.26 kg) F Vag-Spont None N LIV  2 Term 01/09/12 [redacted]w[redacted]d  6 lb 3 oz (2.807 kg) F Vag-Spont None N LIV  1 Term 01/17/10 [redacted]w[redacted]d  6 lb (2.722 kg) F Vag-Spont None N LIV     Reviewed past medical,surgical and family history.  Reviewed problem list, medications and allergies.  Objective Findings:   Vitals:   02/17/17 0909  BP: 108/60  Pulse: (!) 102  Weight: 231 lb 9.6 oz (105.1 kg)    Body mass index is 38.54 kg/m.  Exam System:     General: Well developed & nourished, no acute distress   Skin: Warm & dry, normal coloration and turgor, no rashes   Neurologic: Alert & oriented, normal mood   Cardiovascular: Regular rate & rhythm   Respiratory: Effort & rate normal, LCTAB, acyanotic   Abdomen: Soft, non tender   Extremities: normal strength, tone   Pap smear: neg Jan 2018 @ [facility]  FHR: + via informal transabdominal u/s  Results for orders placed or performed in visit on  02/17/17 (from the past 24 hour(s))  POCT urinalysis dipstick   Collection Time: 02/17/17  9:30 AM  Result Value Ref Range   Color, UA     Clarity, UA     Glucose, UA neg    Bilirubin, UA     Ketones, UA neg    Spec Grav, UA  1.010 - 1.025   Blood, UA neg    pH, UA  5.0 - 8.0   Protein, UA neg    Urobilinogen, UA  0.2 or 1.0 E.U./dL   Nitrite, UA neg    Leukocytes, UA Negative Negative     Assessment:   1) Low-Risk Pregnancy [id number] at [redacted]w[redacted]d with an Estimated Date of Delivery: 09/21/17  2) Initial OB visit 3) N/V of pregnancy  Plan:  Initial labs obtained Continue prenatal vitamins Reviewed n/v relief measures and warning s/s to report Reviewed recommended weight gain based on pre-gravid BMI Encouraged well-balanced diet Genetic Screening discussed Integrated Screen: requested Cystic fibrosis screening discussed declined  Ultrasound discussed; fetal survey: requested CCNC completed Flu shot today Bonjesta rx sent, sent message to [facility] for PA  Return in about 4 weeks (around 03/17/2017) for LROB, US:NT+1stIT.  Orders Placed This Encounter  Procedures  . Urine Culture  . GC/Chlamydia Probe Amp  . CBC  . Hepatitis B surface antigen  . HIV antibody  . Varicella zoster antibody, IgG  . Urinalysis, Routine w reflex microscopic  . Rubella screen  . RPR  . Sickle cell screen  . Pain Management Screening Profile (10S)  . POCT urinalysis dipstick  . ABO/Rh  . Antibody screen    Marge Duncans CNM, [facility] 02/17/2017 10:08 AM

## 2017-02-17 NOTE — Patient Instructions (Signed)

## 2017-02-18 ENCOUNTER — Telehealth: Payer: Self-pay | Admitting: *Deleted

## 2017-02-18 ENCOUNTER — Encounter: Payer: Self-pay | Admitting: Women's Health

## 2017-02-18 ENCOUNTER — Other Ambulatory Visit: Payer: Self-pay | Admitting: Women's Health

## 2017-02-18 DIAGNOSIS — Z2839 Other underimmunization status: Secondary | ICD-10-CM

## 2017-02-18 DIAGNOSIS — O09899 Supervision of other high risk pregnancies, unspecified trimester: Secondary | ICD-10-CM | POA: Insufficient documentation

## 2017-02-18 DIAGNOSIS — Z283 Underimmunization status: Secondary | ICD-10-CM

## 2017-02-18 HISTORY — DX: Supervision of other high risk pregnancies, unspecified trimester: O09.899

## 2017-02-18 HISTORY — DX: Other underimmunization status: Z28.39

## 2017-02-18 LAB — URINALYSIS, ROUTINE W REFLEX MICROSCOPIC
BILIRUBIN UA: NEGATIVE
GLUCOSE, UA: NEGATIVE
KETONES UA: NEGATIVE
Leukocytes, UA: NEGATIVE
NITRITE UA: NEGATIVE
Protein, UA: NEGATIVE
RBC UA: NEGATIVE
SPEC GRAV UA: 1.019 (ref 1.005–1.030)
UUROB: 0.2 mg/dL (ref 0.2–1.0)
pH, UA: 7.5 (ref 5.0–7.5)

## 2017-02-18 LAB — RUBELLA SCREEN: Rubella Antibodies, IGG: 2.77 index (ref >0.99)

## 2017-02-18 LAB — PMP SCREEN PROFILE (10S), URINE
Amphetamine Scrn, Ur: NEGATIVE ng/mL
BARBITURATE SCREEN URINE: NEGATIVE ng/mL
BENZODIAZEPINE SCREEN, URINE: NEGATIVE ng/mL
CANNABINOIDS UR QL SCN: NEGATIVE ng/mL
CREATININE(CRT), U: 144.4 mg/dL (ref 20.0–300.0)
Cocaine (Metab) Scrn, Ur: NEGATIVE ng/mL
Methadone Screen, Urine: NEGATIVE ng/mL
OXYCODONE+OXYMORPHONE UR QL SCN: NEGATIVE ng/mL
Opiate Scrn, Ur: NEGATIVE ng/mL
Ph of Urine: 7.4 (ref 4.5–8.9)
Phencyclidine Qn, Ur: NEGATIVE ng/mL
Propoxyphene Scrn, Ur: NEGATIVE ng/mL

## 2017-02-18 LAB — ABO/RH: Rh Factor: POSITIVE

## 2017-02-18 LAB — HIV ANTIBODY (ROUTINE TESTING W REFLEX): HIV Screen 4th Generation wRfx: NONREACTIVE

## 2017-02-18 LAB — VARICELLA ZOSTER ANTIBODY, IGG

## 2017-02-18 LAB — HEPATITIS B SURFACE ANTIGEN: Hepatitis B Surface Ag: NEGATIVE

## 2017-02-18 LAB — ANTIBODY SCREEN: ANTIBODY SCREEN: NEGATIVE

## 2017-02-18 LAB — CBC
Hematocrit: 34 % (ref 34.0–46.6)
Hemoglobin: 10.8 g/dL — ABNORMAL LOW (ref 11.1–15.9)
MCH: 27.1 pg (ref 26.6–33.0)
MCHC: 31.8 g/dL (ref 31.5–35.7)
MCV: 85 fL (ref 79–97)
Platelets: 238 10*3/uL (ref 150–379)
RBC: 3.99 x10E6/uL (ref 3.77–5.28)
RDW: 14.2 % (ref 12.3–15.4)
WBC: 4.8 10*3/uL (ref 3.4–10.8)

## 2017-02-18 LAB — SICKLE CELL SCREEN: Sickle Cell Screen: NEGATIVE

## 2017-02-18 LAB — RPR: RPR: NONREACTIVE

## 2017-02-18 MED ORDER — FERROUS SULFATE 325 (65 FE) MG PO TABS: 325.0000 mg | ORAL_TABLET | Freq: Two times a day (BID) | ORAL | 3 refills | Status: DC

## 2017-02-18 NOTE — Telephone Encounter (Signed)
LMOVM that she is anemic, rx for fe to her pharmacy and to make sure she's taking pnv daily, increase fe-rich foods: red meats, green leafy vegs, beans, etc. Also informed Bonjesta PA was approved. Will notify pharmacy.

## 2017-02-19 LAB — URINE CULTURE

## 2017-02-19 LAB — GC/CHLAMYDIA PROBE AMP
CHLAMYDIA, DNA PROBE: NEGATIVE
Neisseria gonorrhoeae by PCR: NEGATIVE

## 2017-02-22 ENCOUNTER — Encounter (HOSPITAL_COMMUNITY): Payer: Self-pay | Admitting: Emergency Medicine

## 2017-02-22 ENCOUNTER — Emergency Department (HOSPITAL_COMMUNITY)
Admission: EM | Admit: 2017-02-22 | Discharge: 2017-02-22 | Disposition: A | Discharge status: Home / Self Care | Payer: Medicaid Other | Attending: Emergency Medicine | Admitting: Emergency Medicine

## 2017-02-22 DIAGNOSIS — O211 Hyperemesis gravidarum with metabolic disturbance: Secondary | ICD-10-CM | POA: Insufficient documentation

## 2017-02-22 DIAGNOSIS — E860 Dehydration: Secondary | ICD-10-CM

## 2017-02-22 DIAGNOSIS — O210 Mild hyperemesis gravidarum: Secondary | ICD-10-CM

## 2017-02-22 DIAGNOSIS — O9989 Other specified diseases and conditions complicating pregnancy, childbirth and the puerperium: Secondary | ICD-10-CM | POA: Insufficient documentation

## 2017-02-22 DIAGNOSIS — O219 Vomiting of pregnancy, unspecified: Secondary | ICD-10-CM | POA: Diagnosis present

## 2017-02-22 DIAGNOSIS — R51 Headache: Secondary | ICD-10-CM | POA: Diagnosis not present

## 2017-02-22 DIAGNOSIS — Z3A09 9 weeks gestation of pregnancy: Secondary | ICD-10-CM | POA: Insufficient documentation

## 2017-02-22 DIAGNOSIS — Z87891 Personal history of nicotine dependence: Secondary | ICD-10-CM | POA: Diagnosis not present

## 2017-02-22 LAB — CBC
HCT: 34.2 % — ABNORMAL LOW (ref 36.0–46.0)
Hemoglobin: 11.7 g/dL — ABNORMAL LOW (ref 12.0–15.0)
MCH: 28.4 pg (ref 26.0–34.0)
MCHC: 34.2 g/dL (ref 30.0–36.0)
MCV: 83 fL (ref 78.0–100.0)
PLATELETS: 219 10*3/uL (ref 150–400)
RBC: 4.12 MIL/uL (ref 3.87–5.11)
RDW: 13 % (ref 11.5–15.5)
WBC: 5.6 10*3/uL (ref 4.0–10.5)

## 2017-02-22 LAB — COMPREHENSIVE METABOLIC PANEL
ALT: 15 U/L (ref 14–54)
AST: 19 U/L (ref 15–41)
Albumin: 4 g/dL (ref 3.5–5.0)
Alkaline Phosphatase: 38 U/L (ref 38–126)
Anion gap: 12 (ref 5–15)
BUN: 11 mg/dL (ref 6–20)
CHLORIDE: 102 mmol/L (ref 101–111)
CO2: 22 mmol/L (ref 22–32)
CREATININE: 0.6 mg/dL (ref 0.44–1.00)
Calcium: 9.5 mg/dL (ref 8.9–10.3)
GFR calc Af Amer: >60 mL/min (ref >60)
Glucose, Bld: 96 mg/dL (ref 65–99)
Potassium: 3.8 mmol/L (ref 3.5–5.1)
Sodium: 136 mmol/L (ref 135–145)
Total Bilirubin: 0.5 mg/dL (ref 0.3–1.2)
Total Protein: 8.2 g/dL — ABNORMAL HIGH (ref 6.5–8.1)

## 2017-02-22 LAB — URINALYSIS, ROUTINE W REFLEX MICROSCOPIC
BILIRUBIN URINE: NEGATIVE
GLUCOSE, UA: NEGATIVE mg/dL
HGB URINE DIPSTICK: NEGATIVE
KETONES UR: 80 mg/dL — AB
NITRITE: NEGATIVE
PH: 5 (ref 5.0–8.0)
Protein, ur: 30 mg/dL — AB
SPECIFIC GRAVITY, URINE: 1.031 — AB (ref 1.005–1.030)

## 2017-02-22 LAB — POC URINE PREG, ED: Preg Test, Ur: POSITIVE — AB

## 2017-02-22 MED ORDER — SODIUM CHLORIDE 0.9 % IV BOLUS (SEPSIS)
1000.0000 mL | Freq: Once | INTRAVENOUS | Status: AC
  Administered 2017-02-22: 1000 mL via INTRAVENOUS

## 2017-02-22 MED ORDER — METOCLOPRAMIDE HCL 5 MG/ML IJ SOLN
10.0000 mg | Freq: Once | INTRAMUSCULAR | Status: AC
  Administered 2017-02-22: 10 mg via INTRAMUSCULAR
  Filled 2017-02-22: qty 2

## 2017-02-22 NOTE — ED Triage Notes (Signed)
Patient reports nausea and vomiting with headache. Patient [redacted] weeks pregnant. Per patient goes to [facility]. Patient states 4th pregnancy, no complications with previous pregnancy. Patient reports nausea x1 week, denies taking any medications for it.

## 2017-02-22 NOTE — Discharge Instructions (Signed)
Make sure to drink plenty of fluids at home. Please pick up your medications tomorrow, take them for nausea and vomiting. Follow up with your OB/GYN. Return if worsening symptoms.

## 2017-02-22 NOTE — ED Provider Notes (Signed)
[facility] DEPT Provider Note   CSN: [id number] Arrival date & time: 02/22/17  1456     History   Chief Complaint Chief Complaint  Patient presents with  . Emesis During Pregnancy    HPI Linda Skinner is a 26 y.o. female.  HPI Linda Skinner is a 26 y.o. female presents to emergency department complaining of nausea and vomiting. Patient states she is approximately [redacted] weeks pregnant. She states that she has begun having nausea and vomiting about 2 weeks ago. This is her fourth pregnancy and she reports similar symptoms with the prior 3. She denies any abdominal pain, no vaginal discharge or bleeding. She states nausea is throughout entire day. She states in the last few days she has had difficulty eating or drinking anything at all. She states she was at work today and started feeling dizzy, lightheaded and started seeing spots in her vision. She panicked and was brought to emergency department. She states that she has seen her OB/GYN and has a prescription for nausea medications at the pharmacy that she has not picked up yet. She denies any fever or chills. No urinary symptoms. No back pain. She reports headache that is 2 out of 10, which she has not taken any medications. She denies any other associated symptoms.   Past Medical History:  Diagnosis Date  . Medical history non-contributory     Patient Active Problem List   Diagnosis Date Noted  . Susceptible to varicella (non-immune), currently pregnant 02/18/2017  . Supervision of normal pregnancy 02/17/2017    Past Surgical History:  Procedure Laterality Date  . MOUTH SURGERY    . WISDOM TOOTH EXTRACTION      OB History    Gravida Para Term Preterm AB Living   SAB TAB Ectopic Multiple Live Births           3       Home Medications    Prior to Admission medications   Medication Sig Start Date End Date Taking? Authorizing Provider  Doxylamine-Pyridoxine ER (BONJESTA) 20-20 MG TBCR Take 1 tablet by  mouth at bedtime. Can add 1 tablet in the morning if needed for nausea and vomiting 02/17/17   Cheral Marker, CNM  ferrous sulfate 325 (65 FE) MG tablet Take 1 tablet (325 mg total) by mouth 2 (two) times daily with a meal. 02/18/17   Cheral Marker, CNM  prenatal vitamin w/FE, FA (PRENATAL 1 + 1) 27-1 MG TABS tablet Take 1 tablet by mouth daily at 12 noon. 01/20/17   Adline Potter, NP    Family History Family History  Problem Relation Age of Onset  . Lupus Mother   . Cancer Paternal Grandfather   . Atrial fibrillation Maternal Grandmother     Social History Social History  Substance Use Topics  . Smoking status: Former Smoker    Packs/day: 0.01    Years: 4.00    Types: Cigars  . Smokeless tobacco: Never Used  . Alcohol use No     Allergies   Patient has no known allergies.   Review of Systems Review of Systems  Constitutional: Negative for chills and fever.  Respiratory: Negative for cough, chest tightness and shortness of breath.   Cardiovascular: Negative for chest pain, palpitations and leg swelling.  Gastrointestinal: Positive for nausea and vomiting. Negative for abdominal pain and diarrhea.  Genitourinary: Negative for dysuria, flank pain, pelvic pain, vaginal bleeding, vaginal discharge and vaginal pain.  Musculoskeletal: Negative for arthralgias, myalgias, neck pain and neck stiffness.  Skin: Negative for rash.  Neurological: Positive for dizziness, light-headedness and headaches. Negative for weakness.  All other systems reviewed and are negative.    Physical Exam Updated Vital Signs BP 99/68 (BP Location: Right Arm)   Pulse 82   Temp 98.1 F (36.7 C) (Oral)   Resp 16   Wt 104.8 kg (231 lb)   LMP 12/15/2016 (Approximate)   SpO2 99%   BMI 38.44 kg/m   Physical Exam  Constitutional: She is oriented to person, place, and time. She appears well-developed and well-nourished. No distress.  HENT:  Head: Normocephalic.  Eyes: Conjunctivae are  normal.  Neck: Normal range of motion. Neck supple.  Cardiovascular: Normal rate, regular rhythm and normal heart sounds.   Pulmonary/Chest: Effort normal and breath sounds normal. No respiratory distress. She has no wheezes. She has no rales.  Abdominal: Soft. Bowel sounds are normal. She exhibits no distension. There is no tenderness. There is no rebound.  Musculoskeletal: She exhibits no edema.  Neurological: She is alert and oriented to person, place, and time.  Skin: Skin is warm and dry.  Psychiatric: She has a normal mood and affect. Her behavior is normal.  Nursing note and vitals reviewed.    ED Treatments / Results  Labs (all labs ordered are listed, but only abnormal results are displayed) Labs Reviewed  COMPREHENSIVE METABOLIC PANEL - Abnormal; Notable for the following:       Result Value   Total Protein 8.2 (*)    All other components within normal limits  CBC - Abnormal; Notable for the following:    Hemoglobin 11.7 (*)    HCT 34.2 (*)    All other components within normal limits  POC URINE PREG, ED - Abnormal; Notable for the following:    Preg Test, Ur POSITIVE (*)    All other components within normal limits  URINALYSIS, ROUTINE W REFLEX MICROSCOPIC    EKG  EKG Interpretation None       Radiology No results found.  Procedures Procedures (including critical care time)  Medications Ordered in ED Medications  metoCLOPramide (REGLAN) injection 10 mg (not administered)     Initial Impression / Assessment and Plan / ED Course  I have reviewed the triage vital signs and the nursing notes.  Pertinent labs & imaging results that were available during my care of the patient were reviewed by me and considered in my medical decision making (see chart for details).     Patient in emergency department with nausea and vomiting, [redacted] weeks pregnant according to her. She is not having abdominal pain, no lower abdominal cramping, no vaginal discharge or bleeding.  Her vital signs are normal. She is nontoxic appearing. She does not appear to be dehydrated. Will check labs and urinalysis to assess hydration status. She states she is unable to keep anything down, will give her dose of Reglan IM.   80ketones in UA, 30 protein. Will give IV fluids. Discussed with Dr. Estell Harpin who agrees with plan of hydration.   9:52 PM Patient received 2 L of normal saline. She is feeling better. She is tolerating oral fluids and crackers. She is not vomiting. Vital signs are repeated and normal. She is stable for discharge home with close outpatient follow-up with her OB/GYN. Return precautions discussed.  Vitals:   02/22/17 1506 02/22/17 1507 02/22/17 2202  BP: 99/68  (!) 118/48  Pulse: 82  94  Resp: 16  18  Temp: 98.1 F (36.7 C)    TempSrc: Oral    SpO2: 99%  100%  Weight:  104.8 kg (231 lb)      Final Clinical Impressions(s) / ED Diagnoses   Final diagnoses:  Hyperemesis gravidarum  Dehydration    New Prescriptions New Prescriptions   No medications on file     Iona Coach 02/22/17 2315    Bethann Berkshire, MD 02/22/17 2317

## 2017-03-17 ENCOUNTER — Encounter: Payer: Self-pay | Admitting: Advanced Practice Midwife

## 2017-03-17 ENCOUNTER — Ambulatory Visit (INDEPENDENT_AMBULATORY_CARE_PROVIDER_SITE_OTHER): Payer: Medicaid Other

## 2017-03-17 ENCOUNTER — Ambulatory Visit (INDEPENDENT_AMBULATORY_CARE_PROVIDER_SITE_OTHER): Payer: Medicaid Other | Admitting: Advanced Practice Midwife

## 2017-03-17 VITALS — BP 110/60 | HR 85 | Wt 235.0 lb

## 2017-03-17 DIAGNOSIS — Z1389 Encounter for screening for other disorder: Secondary | ICD-10-CM | POA: Diagnosis not present

## 2017-03-17 DIAGNOSIS — Z3682 Encounter for antenatal screening for nuchal translucency: Secondary | ICD-10-CM | POA: Diagnosis not present

## 2017-03-17 DIAGNOSIS — Z3402 Encounter for supervision of normal first pregnancy, second trimester: Secondary | ICD-10-CM

## 2017-03-17 DIAGNOSIS — Z3A13 13 weeks gestation of pregnancy: Secondary | ICD-10-CM | POA: Diagnosis not present

## 2017-03-17 DIAGNOSIS — Z331 Pregnant state, incidental: Secondary | ICD-10-CM

## 2017-03-17 DIAGNOSIS — Z3481 Encounter for supervision of other normal pregnancy, first trimester: Secondary | ICD-10-CM

## 2017-03-17 LAB — POCT URINALYSIS DIPSTICK
Blood, UA: NEGATIVE
Glucose, UA: NEGATIVE
KETONES UA: NEGATIVE
LEUKOCYTES UA: NEGATIVE
NITRITE UA: NEGATIVE
PROTEIN UA: NEGATIVE

## 2017-03-17 NOTE — Patient Instructions (Signed)

## 2017-03-17 NOTE — Progress Notes (Signed)
US 13+1 wks,measurement c/w dates,NB present,NT 1.6 mm,posterior pl gr 0,normal right ovary,simple left ovarian cyst 3.4 x 2.5 x 2.2 cm,crl 74.3 mm

## 2017-03-17 NOTE — Progress Notes (Signed)
[id number] 5153w1d Estimated Date of Delivery: 09/21/17  Blood pressure 110/60, pulse 85, weight 235 lb (106.6 kg), last menstrual period 12/15/2016.   BP weight and urine results all reviewed and noted.  Please refer to the obstetrical flow sheet for the fundal height and fetal heart rate documentation:  US 13+1 wks,measurement c/w dates,NB present,NT 1.6 mm,posterior pl gr 0,normal right ovary,simple left ovarian cyst 3.4 x 2.5 x 2.2 cm,crl 74.3 mm  Patient denies any bleeding and no rupture of membranes symptoms or regular contractions. Patient is without complaints. All questions were answered.  Orders Placed This Encounter  Procedures  . Integrated 1  . POCT Urinalysis Dipstick    Plan:  Continued routine obstetrical care,   Return in about 4 weeks (around 04/14/2017) for LROB, 2nd IT.

## 2017-03-22 LAB — INTEGRATED 1
CROWN RUMP LENGTH MAT SCREEN: 74.3 mm
GEST. AGE ON COLLECTION DATE: 13.1 wk
MATERNAL AGE AT EDD: 27.7 a
NUCHAL TRANSLUCENCY (NT): 1.6 mm
NUMBER OF FETUSES: 1
PAPP-A VALUE: 736.4 ng/mL
WEIGHT: 235 [lb_av]

## 2017-03-24 ENCOUNTER — Telehealth: Payer: Self-pay | Admitting: *Deleted

## 2017-03-24 NOTE — Telephone Encounter (Signed)
Pt came by office requesting a note for work stating that d/t her pregnancy she shouldn't be pushing or lifting anything heavy. Note left for pt stating no heavy lifting or pushing over 25 pounds.

## 2017-04-14 ENCOUNTER — Encounter: Payer: Medicaid Other | Admitting: Advanced Practice Midwife

## 2017-04-15 ENCOUNTER — Encounter: Payer: Self-pay | Admitting: Advanced Practice Midwife

## 2017-04-15 ENCOUNTER — Ambulatory Visit (INDEPENDENT_AMBULATORY_CARE_PROVIDER_SITE_OTHER): Payer: Medicaid Other | Admitting: Advanced Practice Midwife

## 2017-04-15 VITALS — BP 120/60 | HR 103 | Wt 243.4 lb

## 2017-04-15 DIAGNOSIS — Z3A17 17 weeks gestation of pregnancy: Secondary | ICD-10-CM

## 2017-04-15 DIAGNOSIS — Z363 Encounter for antenatal screening for malformations: Secondary | ICD-10-CM | POA: Diagnosis not present

## 2017-04-15 DIAGNOSIS — Z1379 Encounter for other screening for genetic and chromosomal anomalies: Secondary | ICD-10-CM | POA: Diagnosis not present

## 2017-04-15 DIAGNOSIS — Z3482 Encounter for supervision of other normal pregnancy, second trimester: Secondary | ICD-10-CM | POA: Diagnosis not present

## 2017-04-15 DIAGNOSIS — Z331 Pregnant state, incidental: Secondary | ICD-10-CM | POA: Diagnosis not present

## 2017-04-15 DIAGNOSIS — Z1389 Encounter for screening for other disorder: Secondary | ICD-10-CM

## 2017-04-15 LAB — POCT URINALYSIS DIPSTICK
GLUCOSE UA: NEGATIVE
KETONES UA: NEGATIVE
Leukocytes, UA: NEGATIVE
Nitrite, UA: NEGATIVE
Protein, UA: NEGATIVE
RBC UA: NEGATIVE

## 2017-04-15 NOTE — Progress Notes (Signed)
LOW-RISK PREGNANCY VISIT Patient name: Drusilla KannerStacey Ramsaran MRN [id number]  Date of birth: 02/18/1991 Chief Complaint:   Routine Prenatal Visit (2nd IT)  History of Present Illness:   Drusilla KannerStacey Motsinger is a 26 y.o. [id number] female at 3773w2d with an Estimated Date of Delivery: 09/21/17 being seen today for ongoing management of a low-risk pregnancy.  Today she reports having a HA yesterday. Talked about accupressure points.  .  .   . denies leaking of fluid. Review of Systems:   Pertinent items are noted in HPI Denies abnormal vaginal discharge w/  itching/odor/irritation, headaches, visual changes, shortness of breath, chest pain, abdominal pain, severe nausea/vomiting, or problems with urination or bowel movements unless otherwise stated above.  Pertinent History Reviewed:  Medical & Surgical Hx:   Past Medical History:  Diagnosis Date  . Medical history non-contributory    Past Surgical History:  Procedure Laterality Date  . MOUTH SURGERY    . WISDOM TOOTH EXTRACTION     Family History  Problem Relation Age of Onset  . Lupus Mother   . Cancer Paternal Grandfather   . Atrial fibrillation Maternal Grandmother     Current Outpatient Medications:  .  ferrous sulfate 325 (65 FE) MG tablet, Take 1 tablet (325 mg total) by mouth 2 (two) times daily with a meal., Disp: 60 tablet, Rfl: 3 .  prenatal vitamin w/FE, FA (PRENATAL 1 + 1) 27-1 MG TABS tablet, Take 1 tablet by mouth daily at 12 noon., Disp: 30 each, Rfl: 12 .  Doxylamine-Pyridoxine ER (BONJESTA) 20-20 MG TBCR, Take 1 tablet by mouth at bedtime. Can add 1 tablet in the morning if needed for nausea and vomiting (Patient not taking: Reported on 03/17/2017), Disp: 60 tablet, Rfl: 8 Social History: Reviewed -  reports that she has quit smoking. Her smoking use included cigars. She has a 0.04 pack-year smoking history. she has never used smokeless tobacco.  Physical Assessment:   Vitals:   04/15/17 1627  BP: 120/60  Pulse: (!) 103  Weight:  243 lb 6.4 oz (110.4 kg)  Body mass index is 40.5 kg/m.        Physical Examination:   General appearance: Well appearing, and in no distress  Mental status: Alert, oriented to person, place, and time  Skin: Warm & dry  Cardiovascular: Normal heart rate noted  Respiratory: Normal respiratory effort, no distress  Abdomen: Soft, gravid, nontender  Pelvic: Cervical exam deferred         Extremities: Edema: None  Fetal Status: Fetal Heart Rate (bpm): 140        Results for orders placed or performed in visit on 04/15/17 (from the past 24 hour(s))  POCT urinalysis dipstick   Collection Time: 04/15/17  4:29 PM  Result Value Ref Range   Color, UA     Clarity, UA     Glucose, UA neg    Bilirubin, UA     Ketones, UA neg    Spec Grav, UA  1.010 - 1.025   Blood, UA neg    pH, UA  5.0 - 8.0   Protein, UA neg    Urobilinogen, UA  0.2 or 1.0 E.U./dL   Nitrite, UA neg    Leukocytes, UA Negative Negative    Assessment & Plan:  1) Low-risk pregnancy [id number] at 8273w2d with an Estimated Date of Delivery: 09/21/17   2) ,    Labs/procedures/US today: 2nd IT  Plan:  Continue routine obstetrical care    Follow-up: Return in about  2 weeks (around 04/29/2017) for LROB, MW:NUUVOZDS:Anatomy.  Orders Placed This Encounter  Procedures  . US OB Comp + 14 Wk  . INTEGRATED 2  . POCT urinalysis dipstick   CRESENZO-DISHMAN,Omari Koslosky CNM 04/15/2017 4:48 PM

## 2017-04-15 NOTE — Patient Instructions (Addendum)
Drusilla Kanner, I greatly value your feedback.  If you receive a survey following your visit with [place] today, we appreciate you taking the time to fill it out.  Thanks, Cathie Beams, CNM     Second Trimester of Pregnancy The second trimester is from week 14 through week 27 (months 4 through 6). The second trimester is often a time when you feel your best. Your body has adjusted to being pregnant, and you begin to feel better physically. Usually, morning sickness has lessened or quit completely, you may have more energy, and you may have an increase in appetite. The second trimester is also a time when the fetus is growing rapidly. At the end of the sixth month, the fetus is about 9 inches long and weighs about 1 pounds. You will likely begin to feel the baby move (quickening) between 16 and 20 weeks of pregnancy. Body changes during your second trimester Your body continues to go through many changes during your second trimester. The changes vary from woman to woman.  Your weight will continue to increase. You will notice your lower abdomen bulging out.  You may begin to get stretch marks on your hips, abdomen, and breasts.  You may develop headaches that can be relieved by medicines. The medicines should be approved by your health care provider.  You may urinate more often because the fetus is pressing on your bladder.  You may develop or continue to have heartburn as a result of your pregnancy.  You may develop constipation because certain hormones are causing the muscles that push waste through your intestines to slow down.  You may develop hemorrhoids or swollen, bulging veins (varicose veins).  You may have back pain. This is caused by: ? Weight gain. ? Pregnancy hormones that are relaxing the joints in your pelvis. ? A shift in weight and the muscles that support your balance.  Your breasts will continue to grow and they will continue to become tender.  Your gums may  bleed and may be sensitive to brushing and flossing.  Dark spots or blotches (chloasma, mask of pregnancy) may develop on your face. This will likely fade after the baby is born.  A dark line from your belly button to the pubic area (linea nigra) may appear. This will likely fade after the baby is born.  You may have changes in your hair. These can include thickening of your hair, rapid growth, and changes in texture. Some women also have hair loss during or after pregnancy, or hair that feels dry or thin. Your hair will most likely return to normal after your baby is born.  What to expect at prenatal visits During a routine prenatal visit:  You will be weighed to make sure you and the fetus are growing normally.  Your blood pressure will be taken.  Your abdomen will be measured to track your baby's growth.  The fetal heartbeat will be listened to.  Any test results from the previous visit will be discussed.  Your health care provider may ask you:  How you are feeling.  If you are feeling the baby move.  If you have had any abnormal symptoms, such as leaking fluid, bleeding, severe headaches, or abdominal cramping.  If you are using any tobacco products, including cigarettes, chewing tobacco, and electronic cigarettes.  If you have any questions.  Other tests that may be performed during your second trimester include:  Blood tests that check for: ? Low iron levels (anemia). ? High  blood sugar that affects pregnant women (gestational diabetes) between 42 and 28 weeks. ? Rh antibodies. This is to check for a protein on red blood cells (Rh factor).  Urine tests to check for infections, diabetes, or protein in the urine.  An ultrasound to confirm the proper growth and development of the baby.  An amniocentesis to check for possible genetic problems.  Fetal screens for spina bifida and Down syndrome.  HIV (human immunodeficiency virus) testing. Routine prenatal testing  includes screening for HIV, unless you choose not to have this test.  Follow these instructions at home: Medicines  Follow your health care provider's instructions regarding medicine use. Specific medicines may be either safe or unsafe to take during pregnancy.  Take a prenatal vitamin that contains at least 600 micrograms (mcg) of folic acid.  If you develop constipation, try taking a stool softener if your health care provider approves. Eating and drinking  Eat a balanced diet that includes fresh fruits and vegetables, whole grains, good sources of protein such as meat, eggs, or tofu, and low-fat dairy. Your health care provider will help you determine the amount of weight gain that is right for you.  Avoid raw meat and uncooked cheese. These carry germs that can cause birth defects in the baby.  If you have low calcium intake from food, talk to your health care provider about whether you should take a daily calcium supplement.  Limit foods that are high in fat and processed sugars, such as fried and sweet foods.  To prevent constipation: ? Drink enough fluid to keep your urine clear or pale yellow. ? Eat foods that are high in fiber, such as fresh fruits and vegetables, whole grains, and beans. Activity  Exercise only as directed by your health care provider. Most women can continue their usual exercise routine during pregnancy. Try to exercise for 30 minutes at least 5 days a week. Stop exercising if you experience uterine contractions.  Avoid heavy lifting, wear low heel shoes, and practice good posture.  A sexual relationship may be continued unless your health care provider directs you otherwise. Relieving pain and discomfort  Wear a good support bra to prevent discomfort from breast tenderness.  Take warm sitz baths to soothe any pain or discomfort caused by hemorrhoids. Use hemorrhoid cream if your health care provider approves.  Rest with your legs elevated if you have  leg cramps or low back pain.  If you develop varicose veins, wear support hose. Elevate your feet for 15 minutes, 3-4 times a day. Limit salt in your diet. Prenatal Care  Write down your questions. Take them to your prenatal visits.  Keep all your prenatal visits as told by your health care provider. This is important. Safety  Wear your seat belt at all times when driving.  Make a list of emergency phone numbers, including numbers for family, friends, the hospital, and police and fire departments. General instructions  Ask your health care provider for a referral to a local prenatal education class. Begin classes no later than the beginning of month 6 of your pregnancy.  Ask for help if you have counseling or nutritional needs during pregnancy. Your health care provider can offer advice or refer you to specialists for help with various needs.  Do not use hot tubs, steam rooms, or saunas.  Do not douche or use tampons or scented sanitary pads.  Do not cross your legs for long periods of time.  Avoid cat litter boxes and soil  used by cats. These carry germs that can cause birth defects in the baby and possibly loss of the fetus by miscarriage or stillbirth.  Avoid all smoking, herbs, alcohol, and unprescribed drugs. Chemicals in these products can affect the formation and growth of the baby.  Do not use any products that contain nicotine or tobacco, such as cigarettes and e-cigarettes. If you need help quitting, ask your health care provider.  Visit your dentist if you have not gone yet during your pregnancy. Use a soft toothbrush to brush your teeth and be gentle when you floss. Contact a health care provider if:  You have dizziness.  You have mild pelvic cramps, pelvic pressure, or nagging pain in the abdominal area.  You have persistent nausea, vomiting, or diarrhea.  You have a bad smelling vaginal discharge.  You have pain when you urinate. Get help right away if:  You  have a fever.  You are leaking fluid from your vagina.  You have spotting or bleeding from your vagina.  You have severe abdominal cramping or pain.  You have rapid weight gain or weight loss.  You have shortness of breath with chest pain.  You notice sudden or extreme swelling of your face, hands, ankles, feet, or legs.  You have not felt your baby move in over an hour.  You have severe headaches that do not go away when you take medicine.  You have vision changes. Summary  The second trimester is from week 14 through week 27 (months 4 through 6). It is also a time when the fetus is growing rapidly.  Your body goes through many changes during pregnancy. The changes vary from woman to woman.  Avoid all smoking, herbs, alcohol, and unprescribed drugs. These chemicals affect the formation and growth your baby.  Do not use any tobacco products, such as cigarettes, chewing tobacco, and e-cigarettes. If you need help quitting, ask your health care provider.  Contact your health care provider if you have any questions. Keep all prenatal visits as told by your health care provider. This is important. This information is not intended to replace advice given to you by your health care provider. Make sure you discuss any questions you have with your health care provider.      CHILDBIRTH CLASSES [phone] is the phone number for Pregnancy Classes or hospital tours at [facility].   You will be referred to  [email or web address] for more information on childbirth classes  At this site you may register for classes. You may sign up for a waiting list if classes are full. Please SIGN UP FOR THIS!.   When the waiting list becomes long, sometimes new classes can be added.   Considering Waterbirth? Guide for patients at [facility]  Why consider waterbirth?  . Gentle birth  for babies . Less pain medicine used in labor . May allow for passive descent/less pushing . May reduce perineal tears  . More mobility and instinctive maternal position changes . Increased maternal relaxation . Reduced blood pressure in labor  Is waterbirth safe? What are the risks of infection, drowning or other complications?  . Infection: o Very low risk (3.7 % for tub vs 4.8% for bed) o 7 in 8000 waterbirths with documented infection o Poorly cleaned equipment most common cause o Slightly lower group B strep transmission rate  . Drowning o Maternal:  - Very low risk   - Related to seizures or fainting o Newborn:  - Very low  risk. No evidence of increased risk of respiratory problems in multiple large studies - Physiological protection from breathing under water - Avoid underwater birth if there are any fetal complications - Once baby's head is out of the water, keep it out.  . Birth complication o Some reports of cord trauma, but risk decreased by bringing baby to surface gradually o No evidence of increased risk of shoulder dystocia. Mothers can usually change positions faster in water than in a bed, possibly aiding the maneuvers to free the shoulder.   Am I a candidate for waterbirth?  Yes, if you are: . Full-term (37 weeks or greater)  . Have had an uncomplicated pregnancy and labor  No, if you have: Marland Kitchen. Preterm birth less than 37 weeks . Thick, particulate meconium stained fluid . Maternal fever over 101 . Heavy bleeding or signs of placental abruption . Pre-eclampsia  . Any abnormal fetal heart rate pattern . Breech presentation . Twins  . Very large baby . Active communicable infection (this does NOT include group B strep) . Significant limitation to mobility  Please remember that birth is unpredictable. Under certain unforeseeable circumstances your provider may advise against giving birth in the tub. These decisions will be made on a case-by-case basis and  with the safety of you and your baby as our highest priority.  Requirements for patients planning waterbirth  . Ask your midwife if you will be a candidate for waterbirth. . Attend the [facility] at [facility]. Contact Childbirth Education at [phone] or [phone] for dates and times. The class is free and we strongly encourage you to bring your support person. You will receive a certificate of participation to show to your midwife or doctor. . Supplies needed for [facility] and [facility] patients: o Single-use disposable tub liner (birthpoolinabox.com  REGULAR size) o New garden hose labeled "lead-free", "suitable for drinking water", "non-toxic" OR "water potable" o Garden hose to remove the dirty water o Faucet adaptor to attach hose to faucet         o Electric drain pump to remove water (We recommend 792 gallon per hour or greater pump.)  o Fish net o Bathing suit top (optional) o Long-handled mirror (optional)  [email or web address] sells tubs for $120 if you would rather purchase your own tub For Headaches:   Stay well hydrated, drink enough water so that your urine is clear, sometimes if you are dehydrated you can get headaches  Eat small frequent meals and snacks, sometimes if you are hungry you can get headaches  Sometimes you get headaches during pregnancy from the pregnancy hormones  You can try tylenol (1-2 regular strength 325mg  or 1-2 extra strength 500mg ) as directed on the box. The least amount of medication that works is best.   Cool compresses (cool wet washcloth or ice pack) to area of head that is hurting  You can also try drinking a caffeinated drink to see if this will help  If not helping, try below:  For Prevention of Headaches/Migraines:  CoQ10 100mg  three times daily  Vitamin B2 400mg  daily  Magnesium Oxide 400-600mg  daily  If You Get a Bad Headache/Migraine:  Benadryl 25mg    Magnesium Oxide  1 large  Gatorade  2 extra strength Tylenol (1,000mg  total)  1 cup coffee or Coke  If this doesn't help please call us @ [phone]

## 2017-04-19 LAB — INTEGRATED 2
AFP MOM: 1.02
Alpha-Fetoprotein: 28.5 ng/mL
Crown Rump Length: 74.3 mm
DIA MoM: 0.63
DIA Value: 80 pg/mL
Estriol, Unconjugated: 0.78 ng/mL
GEST. AGE ON COLLECTION DATE: 13.1 wk
Gestational Age: 17.3 weeks
HCG VALUE: 18.4 [IU]/mL
MATERNAL AGE AT EDD: 27.7 a
NUMBER OF FETUSES: 1
Nuchal Translucency (NT): 1.6 mm
Nuchal Translucency MoM: 0.88
PAPP-A MOM: 1.08
PAPP-A VALUE: 736.4 ng/mL
Test Results:: NEGATIVE
WEIGHT: 235 [lb_av]
Weight: 235 [lb_av]
hCG MoM: 0.96
uE3 MoM: 0.89

## 2017-04-27 ENCOUNTER — Encounter: Payer: Self-pay | Admitting: Obstetrics & Gynecology

## 2017-04-27 ENCOUNTER — Ambulatory Visit (INDEPENDENT_AMBULATORY_CARE_PROVIDER_SITE_OTHER): Payer: Medicaid Other | Admitting: Obstetrics & Gynecology

## 2017-04-27 ENCOUNTER — Other Ambulatory Visit: Payer: Self-pay

## 2017-04-27 ENCOUNTER — Ambulatory Visit (INDEPENDENT_AMBULATORY_CARE_PROVIDER_SITE_OTHER): Payer: Medicaid Other

## 2017-04-27 VITALS — BP 118/68 | HR 110 | Wt 244.0 lb

## 2017-04-27 DIAGNOSIS — Z3402 Encounter for supervision of normal first pregnancy, second trimester: Secondary | ICD-10-CM | POA: Diagnosis not present

## 2017-04-27 DIAGNOSIS — Z1389 Encounter for screening for other disorder: Secondary | ICD-10-CM | POA: Diagnosis not present

## 2017-04-27 DIAGNOSIS — Z3A19 19 weeks gestation of pregnancy: Secondary | ICD-10-CM

## 2017-04-27 DIAGNOSIS — Z363 Encounter for antenatal screening for malformations: Secondary | ICD-10-CM | POA: Diagnosis not present

## 2017-04-27 DIAGNOSIS — Z331 Pregnant state, incidental: Secondary | ICD-10-CM | POA: Diagnosis not present

## 2017-04-27 LAB — POCT URINALYSIS DIPSTICK
Glucose, UA: NEGATIVE
KETONES UA: NEGATIVE
Leukocytes, UA: NEGATIVE
Nitrite, UA: NEGATIVE
Protein, UA: NEGATIVE
RBC UA: NEGATIVE

## 2017-04-27 NOTE — Progress Notes (Signed)
[id number] Estimated Date of Delivery: 09/21/17  Blood pressure 118/68, pulse (!) 110, weight 244 lb (110.7 kg), last menstrual period 12/15/2016.   BP weight and urine results all reviewed and noted.  Please refer to the obstetrical flow sheet for the fundal height and fetal heart rate documentation:  Patient reports good fetal movement, denies any bleeding and no rupture of membranes symptoms or regular contractions. Patient is without complaints. All questions were answered.  Orders Placed This Encounter  Procedures  . POCT urinalysis dipstick    Plan:  Continued routine obstetrical care, sonogram is normal see report  Return in about 4 weeks (around 05/25/2017) for LROB.

## 2017-04-27 NOTE — Progress Notes (Signed)
US 19 wks,cephalic,post pl gr 0,normal ovaries bilat,LVEICF 1.6 mm,cx 4.4 cm,afi 5.4 cm,fhr 144 bpm,efw 289 g,anatomy complete

## 2017-05-26 ENCOUNTER — Encounter: Payer: Self-pay | Admitting: Advanced Practice Midwife

## 2017-05-26 ENCOUNTER — Encounter: Payer: Self-pay | Admitting: Women's Health

## 2017-05-26 ENCOUNTER — Ambulatory Visit (INDEPENDENT_AMBULATORY_CARE_PROVIDER_SITE_OTHER): Payer: Medicaid Other | Admitting: Advanced Practice Midwife

## 2017-05-26 VITALS — BP 130/58 | HR 102 | Wt 251.0 lb

## 2017-05-26 DIAGNOSIS — O1212 Gestational proteinuria, second trimester: Secondary | ICD-10-CM

## 2017-05-26 DIAGNOSIS — N83209 Unspecified ovarian cyst, unspecified side: Secondary | ICD-10-CM | POA: Insufficient documentation

## 2017-05-26 DIAGNOSIS — Z3482 Encounter for supervision of other normal pregnancy, second trimester: Secondary | ICD-10-CM

## 2017-05-26 DIAGNOSIS — Z331 Pregnant state, incidental: Secondary | ICD-10-CM | POA: Diagnosis not present

## 2017-05-26 DIAGNOSIS — Z3A23 23 weeks gestation of pregnancy: Secondary | ICD-10-CM

## 2017-05-26 DIAGNOSIS — Z1389 Encounter for screening for other disorder: Secondary | ICD-10-CM

## 2017-05-26 DIAGNOSIS — R809 Proteinuria, unspecified: Secondary | ICD-10-CM

## 2017-05-26 LAB — POCT URINALYSIS DIPSTICK
Blood, UA: NEGATIVE
GLUCOSE UA: NEGATIVE
Ketones, UA: NEGATIVE
LEUKOCYTES UA: NEGATIVE
Nitrite, UA: NEGATIVE

## 2017-05-26 MED ORDER — IMIQUIMOD 5 % EX CREA: TOPICAL_CREAM | CUTANEOUS | 3 refills | Status: DC

## 2017-05-26 NOTE — Progress Notes (Addendum)
[id number] Estimated Date of Delivery: 09/21/17  Blood pressure (!) 130/58, pulse (!) 102, weight 251 lb (113.9 kg), last menstrual period 12/15/2016.   BP weight and urine results all reviewed and noted.  Please refer to the obstetrical flow sheet for the fundal height and fetal heart rate documentation:  Patient reports good fetal movement, denies any bleeding and no rupture of membranes symptoms or regular contractions. Patient says she has labial bumps for a few months. 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    No UTI sx All questions were answered.  Orders Placed This Encounter  Procedures  . Urine Culture  . POCT urinalysis dipstick    Plan:  Continued routine obstetrical care, aldara (no access to TCA anymore). 3x/week.   Return in about 3 weeks (around 06/16/2017) for PN2/LROB.

## 2017-05-26 NOTE — Patient Instructions (Addendum)
1. Before your test, do not eat or drink anything for 8-10 hours prior to your  appointment (a small amount of water is allowed and you may take any medicines you normally take). Be sure to drink lots of water the day before. 2. When you arrive, your blood will be drawn for a 'fasting' blood sugar level.  Then you will be given a sweetened carbonated beverage to drink. You should  complete drinking this beverage within five minutes. After finishing the  beverage, you will have your blood drawn exactly 1 and 2 hours later. Having  your blood drawn on time is an important part of this test. A total of three blood  samples will be done. 3. The test takes approximately 2  hours. During the test, do not have anything to  eat or drink. Do not smoke, chew gum (not even sugarless gum) or use breath mints.  4. During the test you should remain close by and seated as much as possible and  avoid walking around. You may want to bring a book or something else to  occupy your time.  5. After your test, you may eat and drink as normal. You may want to bring a snack  to eat after the test is finished. Your provider will advise you as to the results of  this test and any follow-up if necessary  If your sugar test is positive for gestational diabetes, you will be given an phone call and further instructions discussed. If you wish to know all of your test results before your next appointment, feel free to call the office, or look up your test results on Mychart.  (The range that the lab uses for normal values of the sugar test are not necessarily the range that is used for pregnant women; if your results are within the normal range, they are definitely normal.  However, if a value is deemed "high" by the lab, it may not be too high for a pregnant woman.  We will need to discuss the results if your value(s) fall in the "high" category).     Tdap Vaccine  It is recommended that you get the Tdap vaccine during the  third trimester of EACH pregnancy to help protect your baby from getting pertussis (whooping cough)  27-36 weeks is the BEST time to do this so that you can pass the protection on to your baby. During pregnancy is better than after pregnancy, but if you are unable to get it during pregnancy it will be offered at the hospital.  You can get this vaccine at the health department or your family doctor, as well as some pharmacies.  Everyone who will be around your baby should also be up-to-date on their vaccines. Adults (who are not pregnant) only need 1 dose of Tdap during adulthood.  Considering Waterbirth? Guide for patients at [facility]  Why consider waterbirth?  . Gentle birth for babies . Less pain medicine used in labor . May allow for passive descent/less pushing . May reduce perineal tears  . More mobility and instinctive maternal position changes . Increased maternal relaxation . Reduced blood pressure in labor  Is waterbirth safe? What are the risks of infection, drowning or other complications?  . Infection: o Very low risk (3.7 % for tub vs 4.8% for bed) o 7 in 8000 waterbirths with documented infection o Poorly cleaned equipment most common cause o Slightly lower group B strep transmission rate  . Drowning o  Maternal:  - Very low risk   - Related to seizures or fainting o Newborn:  - Very low risk. No evidence of increased risk of respiratory problems in multiple large studies - Physiological protection from breathing under water - Avoid underwater birth if there are any fetal complications - Once baby's head is out of the water, keep it out.  . Birth complication o Some reports of cord trauma, but risk decreased by bringing baby to surface gradually o No evidence of increased risk of shoulder dystocia. Mothers can usually change positions faster in water than in a bed, possibly aiding the maneuvers to free the shoulder.   Am I a candidate for  waterbirth?  Yes, if you are: . Full-term (37 weeks or greater)  . Have had an uncomplicated pregnancy and labor  No, if you have: Marland Kitchen. Preterm birth less than 37 weeks . Thick, particulate meconium stained fluid . Maternal fever over 101 . Heavy bleeding or signs of placental abruption . Pre-eclampsia  . Any abnormal fetal heart rate pattern . Breech presentation . Twins  . Very large baby . Active communicable infection (this does NOT include group B strep) . Significant limitation to mobility  Please remember that birth is unpredictable. Under certain unforeseeable circumstances your provider may advise against giving birth in the tub. These decisions will be made on a case-by-case basis and with the safety of you and your baby as our highest priority.  Requirements for patients planning waterbirth  . Ask your midwife if you will be a candidate for waterbirth. . Attend the Noelle PennerWaterbirth Class at [facility]. Contact Childbirth Education at [phone] or [phone] for dates and times. The class is free and we strongly encourage you to bring your support person. You will receive a certificate of participation to show to your midwife or doctor. . Supplies needed for [facility] and [facility] patients: o Single-use disposable tub liner (birthpoolinabox.com  REGULAR size) o New garden hose labeled "lead-free", "suitable for drinking water", "non-toxic" OR "water potable" o Garden hose to remove the dirty water o Faucet adaptor to attach hose to faucet         o Electric drain pump to remove water (We recommend 792 gallon per hour or greater pump.)  o Fish net o Bathing suit top (optional) o Long-handled mirror (optional)  http://[email or web address]/[email or web address] sells tubs for $120 if you would rather purchase your own tub        Genital war Genital Warts Genital warts are a common STD (sexually transmitted disease). They may appear as small bumps on the tissues of the  genital area or anal area. Sometimes, they can become irritated and cause pain. Genital warts are easily passed to other people through sexual contact. Getting treatment is important because genital warts can lead to other problems. In females, the virus that causes genital warts may increase the risk of cervical cancer. What are the causes? Genital warts are caused by a virus that is called human papillomavirus (HPV). HPV is spread by having unprotected sex with an infected person. It can be spread through vaginal, anal, and oral sex. Many people do not know that they are infected. They may be infected for years without problems. However, even if they do not have problems, they can pass the infection to their sexual partners. What increases the risk? Genital warts are more likely to develop in:  People who have unprotected sex.  People who have multiple sexual partners.  People who  become sexually active before they are 26 years of age.  Men who are not circumcised.  Women who have a female sexual partner who is not circumcised.  People who have a weakened body defense system (immune system) due to disease or medicine.  People who smoke.  What are the signs or symptoms? Symptoms of genital warts include:  Small growths in the genital area or anal area. These warts often grow in clusters.  Itching and irritation in the genital area or anal area.  Bleeding from the warts.  Painful sexual intercourse.  How is this diagnosed? Genital warts can usually be diagnosed from their appearance on the vagina, vulva, penis, perineum, anus, or rectum. Tests may also be done, such as:  Biopsy. A tissue sample is removed so it can be looked at under a microscope.  Colposcopy. In females, a magnifying tool is used to examine the vagina and cervix. Certain solutions may be used to make the HPV cells change color so they can be seen more easily.  A Pap test in females.  Tests for other  STDs.  How is this treated? Treatment for genital warts may include:  Applying prescription medicines to the warts. These may be solutions or creams.  Freezing the warts with liquid nitrogen (cryotherapy).  Burning the warts with: ? Laser treatment. ? An electrified probe (electrocautery).  Injecting a substance (Candida antigen or Trichophyton antigen) into the warts to help the body's immune system to fight off the warts.  Interferon injections.  Surgery to remove the warts.  Follow these instructions at home: Medicines  Apply over-the-counter and prescription medicines only as told by your health care provider.  Do not treat genital warts with medicines that are used for treating hand warts.  Talk with your health care provider about using over-the-counter anti-itch creams. General instructions  Do not touch or scratch the warts.  Do not have sex until your treatment has been completed.  Tell your current and past sexual partners about your condition because they may also need treatment.  Keep all follow-up visits as told by your health care provider. This is important.  After treatment, use condoms during sex to prevent future infections. Other Instructions for Women  Women who have genital warts might need increased screening for cervical cancer. This type of cancer is slow growing and can be cured if it is found early. Chances of developing cervical cancer are increased with HPV.  If you become pregnant, tell your health care provider that you have had HPV. Your health care provider will monitor you closely during pregnancy to be sure that your baby is safe. How is this prevented? Talk with your health care provider about getting the HPV vaccines. These vaccines prevent some HPV infections and cancers. It is recommended that the vaccine be given to males and females who are 25-58 years of age. It will not work if you already have HPV, and it is not recommended for  pregnant women. Contact a health care provider if:  You have redness, swelling, or pain in the area of the treated skin.  You have a fever.  You feel generally ill.  You feel lumps in and around your genital area or anal area.  You have bleeding in your genital area or anal area.  You have pain during sexual intercourse. This information is not intended to replace advice given to you by your health care provider. Make sure you discuss any questions you have with your health care  provider. Document Released: 05/15/2000 Document Revised: 10/24/2015 Document Reviewed: 08/13/2014 Elsevier Interactive Patient Education  [employer].

## 2017-05-26 NOTE — Addendum Note (Signed)
Addended by: Jacklyn ShellRESENZO-DISHMON, Jobina Maita on: 05/26/2017 02:25 PM   Modules accepted: Orders

## 2017-05-28 LAB — URINE CULTURE

## 2017-06-01 NOTE — L&D Delivery Note (Signed)
Patient: Linda KannerStacey Clinkenbeard MRN: [id number]  GBS status: postive, IAP given (PCN > 2 doses)  Patient is a 27 y.o. now [id number] s/p NSVD at 6678w1d, who was admitted for IOL for decreased fetal movement. Pregnancy also c/b GDM. S/p IOl with FB, oxytocy, AROM 4h 1394m prior to delivery with heavy meconium.    Delivery Note At 11:44 AM a viable female was delivered via Vaginal, Spontaneous (Presentation: veretx; ROA ).  APGAR: 9, 9; weight  pending Placenta status: intact.  Cord: 3-vessel with the following complications: none.  Anesthesia:  Epidural Episiotomy: None Lacerations: None Suture Repair: none Est. Blood Loss (mL):  200  Mom to postpartum.  Baby to Couplet care / Skin to Skin.  Raynelle FanningJulie P. Degele, MD OB Fellow 09/15/17, 12:00 PM

## 2017-06-11 ENCOUNTER — Telehealth: Payer: Self-pay | Admitting: *Deleted

## 2017-06-11 ENCOUNTER — Emergency Department (HOSPITAL_COMMUNITY)
Admission: EM | Admit: 2017-06-11 | Discharge: 2017-06-11 | Disposition: A | Discharge status: Home / Self Care | Payer: Medicaid Other | Attending: Emergency Medicine | Admitting: Emergency Medicine

## 2017-06-11 ENCOUNTER — Other Ambulatory Visit: Payer: Self-pay

## 2017-06-11 ENCOUNTER — Encounter (HOSPITAL_COMMUNITY): Payer: Self-pay | Admitting: Emergency Medicine

## 2017-06-11 DIAGNOSIS — R102 Pelvic and perineal pain: Secondary | ICD-10-CM | POA: Diagnosis not present

## 2017-06-11 DIAGNOSIS — N949 Unspecified condition associated with female genital organs and menstrual cycle: Secondary | ICD-10-CM

## 2017-06-11 DIAGNOSIS — Z3A25 25 weeks gestation of pregnancy: Secondary | ICD-10-CM | POA: Diagnosis not present

## 2017-06-11 DIAGNOSIS — O9989 Other specified diseases and conditions complicating pregnancy, childbirth and the puerperium: Secondary | ICD-10-CM | POA: Diagnosis present

## 2017-06-11 DIAGNOSIS — Z87891 Personal history of nicotine dependence: Secondary | ICD-10-CM | POA: Diagnosis not present

## 2017-06-11 NOTE — Telephone Encounter (Signed)
Patient states she is experiencing stabbing, sharp pain her vaginal pain and has increased pressure since last night. She is feeling cramping but doesn't think they are contractions. She is not having any abnormal discharge, leaking or bleeding.  [facility] since our office closes at 2pm.  Verbalized understanding and no further questions.

## 2017-06-11 NOTE — Progress Notes (Signed)
Talked with Brandi at [facility].  Cannot see tracings from [facility] here at [facility].  Connections at [facility] checked and no problems noted.  Requested strip to be faxed to be read here at [facility].

## 2017-06-11 NOTE — ED Provider Notes (Signed)
[address] [facility] EMERGENCY DEPARTMENT Provider Note   CSN: [id number] Arrival date & time: 06/11/17  1309  History   Chief Complaint Chief Complaint  Patient presents with  . pregnant- cramping    25 weeks   HPI Rebel Laughridge is a 27 y.o. female, [id number] at [redacted]w[redacted]d p/w stabbing pain in groin region that started yesterday evening and has been intermittent. Patient reports when she bends over to get something, a sharp pain comes on and lasts 30 seconds to one minute, then resolves. She denies LOF or bleeding. Denies cramping pain. Endorses normal fetal movement. Denies urinary symptoms.   Has not been sexually active, had a digital exam or a pelvic ultrasound in the past 24 hours.  Past Medical History:  Diagnosis Date  . Medical history non-contributory     Patient Active Problem List   Diagnosis Date Noted  . Ovarian cyst 05/26/2017  . Susceptible to varicella (non-immune), currently pregnant 02/18/2017  . Supervision of normal pregnancy 02/17/2017    Past Surgical History:  Procedure Laterality Date  . MOUTH SURGERY    . WISDOM TOOTH EXTRACTION      OB History    Gravida Para Term Preterm AB Living   6 3 3   2 3    SAB TAB Ectopic Multiple Live Births           3       Home Medications    Prior to Admission medications   Medication Sig Start Date End Date Taking? Authorizing Provider  ferrous sulfate 325 (65 FE) MG tablet Take 1 tablet (325 mg total) by mouth 2 (two) times daily with a meal. 02/18/17  Yes Booker, Merlene Laughter, CNM  imiquimod (ALDARA) 5 % cream Apply topically 3 (three) times a week. 05/26/17  Yes Cresenzo-Dishmon, Scarlette Calico, CNM  prenatal vitamin w/FE, FA (PRENATAL 1 + 1) 27-1 MG TABS tablet Take 1 tablet by mouth daily at 12 noon. 01/20/17  Yes Adline Potter, NP    Family History Family History  Problem Relation Age of Onset  . Lupus Mother   . Cancer Paternal Grandfather   . Atrial fibrillation Maternal Grandmother     Social History Social  History   Tobacco Use  . Smoking status: Former Smoker    Packs/day: 0.01    Years: 4.00    Pack years: 0.04    Types: Cigars  . Smokeless tobacco: Never Used  Substance Use Topics  . Alcohol use: No  . Drug use: No     Allergies   Patient has no known allergies.   Review of Systems Review of Systems  Constitutional: Negative for fever.  Gastrointestinal: Negative for constipation, diarrhea and nausea.  Genitourinary: Negative for dysuria, frequency and urgency.     Physical Exam Updated Vital Signs BP 109/66   Pulse 86   Temp 98.4 F (36.9 C) (Oral)   Resp 19   Ht 5\' 4"  (1.626 m)   Wt 113.4 kg (250 lb)   LMP 12/15/2016 (Approximate)   SpO2 100%   BMI 42.91 kg/m   Physical Exam  Constitutional: She is oriented to person, place, and time. She appears well-developed and well-nourished. No distress.  HENT:  Head: Normocephalic and atraumatic.  Neck: Normal range of motion. Neck supple.  Cardiovascular: Normal rate, regular rhythm and normal heart sounds.  Pulmonary/Chest: Effort normal and breath sounds normal.  Abdominal:  Gravid, nontender to palpation.  Musculoskeletal: She exhibits no edema.  Neurological: She is alert and oriented to person,  place, and time.  Skin: Skin is warm and dry.  Psychiatric: She has a normal mood and affect. Judgment and thought content normal.   Cervical Exam: 1cm external os but rightly closed internal os, cervix is thick  FHT: 145-155 with good variability when on the monitor TOCO: No contractions   ED Treatments / Results  Labs (all labs ordered are listed, but only abnormal results are displayed) Labs Reviewed - No data to display  EKG  EKG Interpretation None       Radiology No results found.  Procedures Procedures (including critical care time)  Medications Ordered in ED Medications - No data to display   Initial Impression / Assessment and Plan / ED Course  I have reviewed the triage vital signs  and the nursing notes.  Pertinent labs & imaging results that were available during my care of the patient were reviewed by me and considered in my medical decision making (see chart for details).   26yo [id number] presenting with intermittent groin pain which seems most consistent with round ligament pain. No cramping or LOF, bleeding. On monitor, FHR 150s, no contractions recorded. Spoke with Dr. Penne LashLeggett on the phone who recommended checking the patient's cervix. Fetal fibronectin not available at this hospital. Patient was found to be closed thick and high with a category I tracing as noted above. The FHT was reviewed via facetime with one of the [facility] at [facility] and thought to be typical for a 25 week patient.  Plan formulated with OB to discharge patient with strict return precautions because her cervix is closed. She was advised to follow up at [facility] at [facility] immediately if she has any LOF, bleeding, contractions or abdominal pain.  Final Clinical Impressions(s) / ED Diagnoses   Final diagnoses:  Round ligament pain    ED Discharge Orders    None       Howard PouchFeng, Faithanne Verret, MD 06/11/17 1603    Blane OharaZavitz, Joshua, MD 06/11/17 1627

## 2017-06-11 NOTE — ED Triage Notes (Signed)
Pt has had pressure and cramping since last night at 2100  [facility]   [place] here  Denies discharge

## 2017-06-11 NOTE — Progress Notes (Addendum)
Talked with [facility] MD on call.  Will Facetime so I can review strip.  Dr Penne LashLeggett in agreement with plan.  Pt has no complaints of leaking or bleeding.  Is texting on phone in ED.  10 minute strip faxed to L+D.  WNL of [redacted] week gestation.  Dr Penne LashLeggett reviewed paper strip.   Dr Penne LashLeggett requested for [facility] MD to check her cervix and if its closed/thick/high...she may D/C home.  Keep appt on 1/16 with office.

## 2017-06-11 NOTE — ED Notes (Signed)
[facility] with [facility] at [facility]  .  Pt being monitored.

## 2017-06-11 NOTE — Discharge Instructions (Signed)
You were seen in the emergency department for your obstetric symptoms.  If you develop pain, cramping, loss of fluid or bleeding these would be reasons to go [facility] immediately to be reevaluated.

## 2017-06-16 ENCOUNTER — Other Ambulatory Visit: Payer: Medicaid Other

## 2017-06-16 ENCOUNTER — Ambulatory Visit (INDEPENDENT_AMBULATORY_CARE_PROVIDER_SITE_OTHER): Payer: Medicaid Other | Admitting: Advanced Practice Midwife

## 2017-06-16 ENCOUNTER — Encounter: Payer: Self-pay | Admitting: Advanced Practice Midwife

## 2017-06-16 VITALS — BP 102/70 | HR 113 | Wt 254.0 lb

## 2017-06-16 DIAGNOSIS — Z3482 Encounter for supervision of other normal pregnancy, second trimester: Secondary | ICD-10-CM | POA: Diagnosis not present

## 2017-06-16 DIAGNOSIS — Z3A26 26 weeks gestation of pregnancy: Secondary | ICD-10-CM

## 2017-06-16 DIAGNOSIS — Z331 Pregnant state, incidental: Secondary | ICD-10-CM

## 2017-06-16 DIAGNOSIS — Z1389 Encounter for screening for other disorder: Secondary | ICD-10-CM | POA: Diagnosis not present

## 2017-06-16 DIAGNOSIS — Z131 Encounter for screening for diabetes mellitus: Secondary | ICD-10-CM

## 2017-06-16 LAB — POCT URINALYSIS DIPSTICK
Blood, UA: NEGATIVE
GLUCOSE UA: NEGATIVE
Ketones, UA: NEGATIVE
Leukocytes, UA: NEGATIVE
Nitrite, UA: NEGATIVE

## 2017-06-16 NOTE — Progress Notes (Signed)
[id number] 7356w1d Estimated Date of Delivery: 09/21/17  Blood pressure 102/70, pulse (!) 113, weight 254 lb (115.2 kg), last menstrual period 12/15/2016.   BP weight and urine results all reviewed and noted.  Please refer to the obstetrical flow sheet for the fundal height and fetal heart rate documentation:  Patient reports good fetal movement, denies any bleeding and no rupture of membranes symptoms or regular contractions. Patient is without complaints. All questions were answered. Thinking about BTL  Nexplanon made her bleed.  Discussed regret/Paragard. Will think about it  Orders Placed This Encounter  Procedures  . POCT urinalysis dipstick    Plan:  Continued routine obstetrical care, PN2 today   Return in about 3 weeks (around 07/07/2017) for LROB.

## 2017-06-16 NOTE — Patient Instructions (Addendum)
Drusilla KannerStacey Hazell, I greatly value your feedback.  If you receive a survey following your visit with us today, we appreciate you taking the time to fill it out.  Thanks, Cathie BeamsFran Cresenzo-Dishmon, CNM   Call the office [phone]) or go to [facility] if:  You begin to have strong, frequent contractions  Your water breaks.  Sometimes it is a big gush of fluid, sometimes it is just a trickle that keeps getting your panties wet or running down your legs  You have vaginal bleeding.  It is normal to have a small amount of spotting if your cervix was checked.   You don't feel your baby moving like normal.  If you don't, get you something to eat and drink and lay down and focus on feeling your baby move.  You should feel at least 10 movements in 2 hours.  If you don't, you should call the office or go to [facility].    Tdap Vaccine  It is recommended that you get the Tdap vaccine during the third trimester of EACH pregnancy to help protect your baby from getting pertussis (whooping cough)  27-36 weeks is the BEST time to do this so that you can pass the protection on to your baby. During pregnancy is better than after pregnancy, but if you are unable to get it during pregnancy it will be offered at the hospital.   You can get this vaccine at the health department or your family doctor  Everyone who will be around your baby should also be up-to-date on their vaccines. Adults (who are not pregnant) only need 1 dose of Tdap during adulthood.   Third Trimester of Pregnancy The third trimester is from week 29 through week 42, months 7 through 9. The third trimester is a time when the fetus is growing rapidly. At the end of the ninth month, the fetus is about 20 inches in length and weighs 6-10 pounds.  BODY CHANGES Your body goes through many changes during pregnancy. The changes vary from woman to woman.   Your weight will continue to increase. You can expect to gain 25-35 pounds (11-16 kg) by  the end of the pregnancy.  You may begin to get stretch marks on your hips, abdomen, and breasts.  You may urinate more often because the fetus is moving lower into your pelvis and pressing on your bladder.  You may develop or continue to have heartburn as a result of your pregnancy.  You may develop constipation because certain hormones are causing the muscles that push waste through your intestines to slow down.  You may develop hemorrhoids or swollen, bulging veins (varicose veins).  You may have pelvic pain because of the weight gain and pregnancy hormones relaxing your joints between the bones in your pelvis. Backaches may result from overexertion of the muscles supporting your posture.  You may have changes in your hair. These can include thickening of your hair, rapid growth, and changes in texture. Some women also have hair loss during or after pregnancy, or hair that feels dry or thin. Your hair will most likely return to normal after your baby is born.  Your breasts will continue to grow and be tender. A yellow discharge may leak from your breasts called colostrum.  Your belly button may stick out.  You may feel short of breath because of your expanding uterus.  You may notice the fetus "dropping," or moving lower in your abdomen.  You may have a bloody mucus discharge.  This usually occurs a few days to a week before labor begins.  Your cervix becomes thin and soft (effaced) near your due date. WHAT TO EXPECT AT YOUR PRENATAL EXAMS  You will have prenatal exams every 2 weeks until week 36. Then, you will have weekly prenatal exams. During a routine prenatal visit:  You will be weighed to make sure you and the fetus are growing normally.  Your blood pressure is taken.  Your abdomen will be measured to track your baby's growth.  The fetal heartbeat will be listened to.  Any test results from the previous visit will be discussed.  You may have a cervical check near your  due date to see if you have effaced. At around 36 weeks, your caregiver will check your cervix. At the same time, your caregiver will also perform a test on the secretions of the vaginal tissue. This test is to determine if a type of bacteria, Group B streptococcus, is present. Your caregiver will explain this further. Your caregiver may ask you:  What your birth plan is.  How you are feeling.  If you are feeling the baby move.  If you have had any abnormal symptoms, such as leaking fluid, bleeding, severe headaches, or abdominal cramping.  If you have any questions. Other tests or screenings that may be performed during your third trimester include:  Blood tests that check for low iron levels (anemia).  Fetal testing to check the health, activity level, and growth of the fetus. Testing is done if you have certain medical conditions or if there are problems during the pregnancy. FALSE LABOR You may feel small, irregular contractions that eventually go away. These are called Braxton Hicks contractions, or false labor. Contractions may last for hours, days, or even weeks before true labor sets in. If contractions come at regular intervals, intensify, or become painful, it is best to be seen by your caregiver.  SIGNS OF LABOR   Menstrual-like cramps.  Contractions that are 5 minutes apart or less.  Contractions that start on the top of the uterus and spread down to the lower abdomen and back.  A sense of increased pelvic pressure or back pain.  A watery or bloody mucus discharge that comes from the vagina. If you have any of these signs before the 37th week of pregnancy, call your caregiver right away. You need to go to the hospital to get checked immediately. HOME CARE INSTRUCTIONS   Avoid all smoking, herbs, alcohol, and unprescribed drugs. These chemicals affect the formation and growth of the baby.  Follow your caregiver's instructions regarding medicine use. There are medicines  that are either safe or unsafe to take during pregnancy.  Exercise only as directed by your caregiver. Experiencing uterine cramps is a good sign to stop exercising.  Continue to eat regular, healthy meals.  Wear a good support bra for breast tenderness.  Do not use hot tubs, steam rooms, or saunas.  Wear your seat belt at all times when driving.  Avoid raw meat, uncooked cheese, cat litter boxes, and soil used by cats. These carry germs that can cause birth defects in the baby.  Take your prenatal vitamins.  Try taking a stool softener (if your caregiver approves) if you develop constipation. Eat more high-fiber foods, such as fresh vegetables or fruit and whole grains. Drink plenty of fluids to keep your urine clear or pale yellow.  Take warm sitz baths to soothe any pain or discomfort caused by hemorrhoids. Use hemorrhoid  cream if your caregiver approves.  If you develop varicose veins, wear support hose. Elevate your feet for 15 minutes, 3-4 times a day. Limit salt in your diet.  Avoid heavy lifting, wear low heal shoes, and practice good posture.  Rest a lot with your legs elevated if you have leg cramps or low back pain.  Visit your dentist if you have not gone during your pregnancy. Use a soft toothbrush to brush your teeth and be gentle when you floss.  A sexual relationship may be continued unless your caregiver directs you otherwise.  Do not travel far distances unless it is absolutely necessary and only with the approval of your caregiver.  Take prenatal classes to understand, practice, and ask questions about the labor and delivery.  Make a trial run to the hospital.  Pack your hospital bag.  Prepare the baby's nursery.  Continue to go to all your prenatal visits as directed by your caregiver. SEEK MEDICAL CARE IF:  You are unsure if you are in labor or if your water has broken.  You have dizziness.  You have mild pelvic cramps, pelvic pressure, or nagging  pain in your abdominal area.  You have persistent nausea, vomiting, or diarrhea.  You have a bad smelling vaginal discharge.  You have pain with urination. SEEK IMMEDIATE MEDICAL CARE IF:   You have a fever.  You are leaking fluid from your vagina.  You have spotting or bleeding from your vagina.  You have severe abdominal cramping or pain.  You have rapid weight loss or gain.  You have shortness of breath with chest pain.  You notice sudden or extreme swelling of your face, hands, ankles, feet, or legs.  You have not felt your baby move in over an hour.  You have severe headaches that do not go away with medicine.  You have vision changes. Document Released: 05/12/2001 Document Revised: 05/23/2013 Document Reviewed: 07/19/2012 [facility] Patient Information 2015 [place], [place]. This information is not intended to replace advice given to you by your health care provider. Make sure you discuss any questions you have with your health care provider.   Kinesiology taping for pregnancy:  Youtube has good vidoes of "how tos" for lower back, pelvic, hip pain; swelling of feet, etc

## 2017-06-17 ENCOUNTER — Other Ambulatory Visit: Payer: Self-pay | Admitting: Advanced Practice Midwife

## 2017-06-17 ENCOUNTER — Encounter: Payer: Self-pay | Admitting: Advanced Practice Midwife

## 2017-06-17 DIAGNOSIS — O24410 Gestational diabetes mellitus in pregnancy, diet controlled: Secondary | ICD-10-CM

## 2017-06-17 DIAGNOSIS — O24419 Gestational diabetes mellitus in pregnancy, unspecified control: Secondary | ICD-10-CM | POA: Insufficient documentation

## 2017-06-17 LAB — CBC
Hematocrit: 33.1 % — ABNORMAL LOW (ref 34.0–46.6)
Hemoglobin: 10.8 g/dL — ABNORMAL LOW (ref 11.1–15.9)
MCH: 27.6 pg (ref 26.6–33.0)
MCHC: 32.6 g/dL (ref 31.5–35.7)
MCV: 85 fL (ref 79–97)
PLATELETS: 205 10*3/uL (ref 150–379)
RBC: 3.91 x10E6/uL (ref 3.77–5.28)
RDW: 14.3 % (ref 12.3–15.4)
WBC: 5.5 10*3/uL (ref 3.4–10.8)

## 2017-06-17 LAB — GLUCOSE TOLERANCE, 2 HOURS W/ 1HR
GLUCOSE, 1 HOUR: 122 mg/dL (ref 65–179)
GLUCOSE, FASTING: 99 mg/dL — AB (ref 65–91)
Glucose, 2 hour: 114 mg/dL (ref 65–152)

## 2017-06-17 LAB — HIV ANTIBODY (ROUTINE TESTING W REFLEX): HIV Screen 4th Generation wRfx: NONREACTIVE

## 2017-06-17 LAB — ANTIBODY SCREEN: ANTIBODY SCREEN: NEGATIVE

## 2017-06-17 LAB — RPR: RPR Ser Ql: NONREACTIVE

## 2017-06-17 NOTE — Progress Notes (Signed)
Referred to nutrition and mychart message sent

## 2017-07-07 ENCOUNTER — Ambulatory Visit (INDEPENDENT_AMBULATORY_CARE_PROVIDER_SITE_OTHER): Payer: Medicaid Other | Admitting: Women's Health

## 2017-07-07 ENCOUNTER — Encounter: Payer: Self-pay | Admitting: Women's Health

## 2017-07-07 VITALS — BP 120/70 | HR 98 | Wt 262.0 lb

## 2017-07-07 DIAGNOSIS — Z1389 Encounter for screening for other disorder: Secondary | ICD-10-CM | POA: Diagnosis not present

## 2017-07-07 DIAGNOSIS — O0990 Supervision of high risk pregnancy, unspecified, unspecified trimester: Secondary | ICD-10-CM

## 2017-07-07 DIAGNOSIS — O24410 Gestational diabetes mellitus in pregnancy, diet controlled: Secondary | ICD-10-CM

## 2017-07-07 DIAGNOSIS — Z331 Pregnant state, incidental: Secondary | ICD-10-CM

## 2017-07-07 DIAGNOSIS — O0993 Supervision of high risk pregnancy, unspecified, third trimester: Secondary | ICD-10-CM

## 2017-07-07 DIAGNOSIS — Z3A29 29 weeks gestation of pregnancy: Secondary | ICD-10-CM

## 2017-07-07 LAB — POCT URINALYSIS DIPSTICK
Blood, UA: NEGATIVE
GLUCOSE UA: NEGATIVE
Ketones, UA: NEGATIVE
LEUKOCYTES UA: NEGATIVE
NITRITE UA: NEGATIVE

## 2017-07-07 NOTE — Progress Notes (Signed)
   HIGH-RISK PREGNANCY VISIT Patient name: Linda KannerStacey Skinner MRN [id number]  Date of birth: 12/11/1990 Chief Complaint:   High Risk Gestation  History of Present Illness:   Linda Skinner is a 27 y.o. [id number] female at 1580w1d with an Estimated Date of Delivery: 09/21/17 being seen today for ongoing management of a high-risk pregnancy complicated by A1DM.  Today she reports has appt w/ dietician 07/15/17. Contractions: Not present.  .  Movement: Present. denies leaking of fluid.  Review of Systems:   Pertinent items are noted in HPI Denies abnormal vaginal discharge w/ itching/odor/irritation, headaches, visual changes, shortness of breath, chest pain, abdominal pain, severe nausea/vomiting, or problems with urination or bowel movements unless otherwise stated above. Pertinent History Reviewed:  Reviewed past medical,surgical, social, obstetrical and family history.  Reviewed problem list, medications and allergies. Physical Assessment:   Vitals:   07/07/17 0854  BP: 120/70  Pulse: 98  Weight: 262 lb (118.8 kg)  Body mass index is 44.97 kg/m.           Physical Examination:   General appearance: alert, well appearing, and in no distress  Mental status: alert, oriented to person, place, and time  Skin: warm & dry   Extremities: Edema: None    Cardiovascular: normal heart rate noted  Respiratory: normal respiratory effort, no distress  Abdomen: gravid, soft, non-tender  Pelvic: Cervical exam deferred         Fetal Status: Fetal Heart Rate (bpm): 138 Fundal Height: 31 cm Movement: Present    Fetal Surveillance Testing today: doppler   Results for orders placed or performed in visit on 07/07/17 (from the past 24 hour(s))  POCT urinalysis dipstick   Collection Time: 07/07/17  8:57 AM  Result Value Ref Range   Color, UA     Clarity, UA     Glucose, UA neg    Bilirubin, UA     Ketones, UA neg    Spec Grav, UA  1.010 - 1.025   Blood, UA neg    pH, UA  5.0 - 8.0   Protein, UA trace    Urobilinogen, UA  0.2 or 1.0 E.U./dL   Nitrite, UA neg    Leukocytes, UA Negative Negative   Appearance     Odor      Assessment & Plan:  1) High-risk pregnancy [id number] at 2880w1d with an Estimated Date of Delivery: 09/21/17   2) A1DM, hasn't started checking sugars, goes to dietician 2/14, discussed GDM dx, decreasing carbs, checking sugars QID/goals, bring log to each appt  3) Wants BTL, discussed risks/benefits, consent signed today  Meds: No orders of the defined types were placed in this encounter.   Labs/procedures today: none  Treatment Plan:  Growth u/s @ 36-38wks     Deliver @ 40wks  Reviewed: Preterm labor symptoms and general obstetric precautions including but not limited to vaginal bleeding, contractions, leaking of fluid and fetal movement were reviewed in detail with the patient.  Recommended Tdap at HD/PCP per CDC guidelines. All questions were answered.  Follow-up: Return in about 2 weeks (around 07/22/2017) for HROB, Sign BTL consent today.  Orders Placed This Encounter  Procedures  . POCT urinalysis dipstick   Marge DuncansBooker, Glenyce Randle Randall CNM, [facility] 07/07/2017 9:18 AM

## 2017-07-07 NOTE — Patient Instructions (Addendum)
[name], I greatly value your feedback.  If you receive a survey following your visit with [place] today, we appreciate you taking the time to fill it out.  Thanks, Knute Neu, CNM, WHNP-BC  Check blood sugars 4 times a day: in the morning before eating/drinking anything (<95) and 2 hours after eating breakfast, lunch, and supper (<120).     Call the office [phone]) or go to [facility] if:  You begin to have strong, frequent contractions  Your water breaks.  Sometimes it is a big gush of fluid, sometimes it is just a trickle that keeps getting your panties wet or running down your legs  You have vaginal bleeding.  It is normal to have a small amount of spotting if your cervix was checked.   You don't feel your baby moving like normal.  If you don't, get you something to eat and drink and lay down and focus on feeling your baby move.  You should feel at least 10 movements in 2 hours.  If you don't, you should call the office or go to [facility].    Tdap Vaccine  It is recommended that you get the Tdap vaccine during the third trimester of EACH pregnancy to help protect your baby from getting pertussis (whooping cough)  27-36 weeks is the BEST time to do this so that you can pass the protection on to your baby. During pregnancy is better than after pregnancy, but if you are unable to get it during pregnancy it will be offered at the hospital.   You can get this vaccine at the health department or your family doctor  Everyone who will be around your baby should also be up-to-date on their vaccines. Adults (who are not pregnant) only need 1 dose of Tdap during adulthood.   Third Trimester of Pregnancy The third trimester is from week 29 through week 42, months 7 through 9. The third trimester is a time when the fetus is growing rapidly. At the end of the ninth month, the fetus is about 20 inches in length and weighs 6-10 pounds.  BODY CHANGES Your body goes through many  changes during pregnancy. The changes vary from woman to woman.   Your weight will continue to increase. You can expect to gain 25-35 pounds (11-16 kg) by the end of the pregnancy.  You may begin to get stretch marks on your hips, abdomen, and breasts.  You may urinate more often because the fetus is moving lower into your pelvis and pressing on your bladder.  You may develop or continue to have heartburn as a result of your pregnancy.  You may develop constipation because certain hormones are causing the muscles that push waste through your intestines to slow down.  You may develop hemorrhoids or swollen, bulging veins (varicose veins).  You may have pelvic pain because of the weight gain and pregnancy hormones relaxing your joints between the bones in your pelvis. Backaches may result from overexertion of the muscles supporting your posture.  You may have changes in your hair. These can include thickening of your hair, rapid growth, and changes in texture. Some women also have hair loss during or after pregnancy, or hair that feels dry or thin. Your hair will most likely return to normal after your baby is born.  Your breasts will continue to grow and be tender. A yellow discharge may leak from your breasts called colostrum.  Your belly button may stick out.  You may feel short  of breath because of your expanding uterus.  You may notice the fetus "dropping," or moving lower in your abdomen.  You may have a bloody mucus discharge. This usually occurs a few days to a week before labor begins.  Your cervix becomes thin and soft (effaced) near your due date. WHAT TO EXPECT AT YOUR PRENATAL EXAMS  You will have prenatal exams every 2 weeks until week 36. Then, you will have weekly prenatal exams. During a routine prenatal visit:  You will be weighed to make sure you and the fetus are growing normally.  Your blood pressure is taken.  Your abdomen will be measured to track your baby's  growth.  The fetal heartbeat will be listened to.  Any test results from the previous visit will be discussed.  You may have a cervical check near your due date to see if you have effaced. At around 36 weeks, your caregiver will check your cervix. At the same time, your caregiver will also perform a test on the secretions of the vaginal tissue. This test is to determine if a type of bacteria, Group B streptococcus, is present. Your caregiver will explain this further. Your caregiver may ask you:  What your birth plan is.  How you are feeling.  If you are feeling the baby move.  If you have had any abnormal symptoms, such as leaking fluid, bleeding, severe headaches, or abdominal cramping.  If you have any questions. Other tests or screenings that may be performed during your third trimester include:  Blood tests that check for low iron levels (anemia).  Fetal testing to check the health, activity level, and growth of the fetus. Testing is done if you have certain medical conditions or if there are problems during the pregnancy. FALSE LABOR You may feel small, irregular contractions that eventually go away. These are called Braxton Hicks contractions, or false labor. Contractions may last for hours, days, or even weeks before true labor sets in. If contractions come at regular intervals, intensify, or become painful, it is best to be seen by your caregiver.  SIGNS OF LABOR   Menstrual-like cramps.  Contractions that are 5 minutes apart or less.  Contractions that start on the top of the uterus and spread down to the lower abdomen and back.  A sense of increased pelvic pressure or back pain.  A watery or bloody mucus discharge that comes from the vagina. If you have any of these signs before the 37th week of pregnancy, call your caregiver right away. You need to go to the hospital to get checked immediately. HOME CARE INSTRUCTIONS   Avoid all smoking, herbs, alcohol, and  unprescribed drugs. These chemicals affect the formation and growth of the baby.  Follow your caregiver's instructions regarding medicine use. There are medicines that are either safe or unsafe to take during pregnancy.  Exercise only as directed by your caregiver. Experiencing uterine cramps is a good sign to stop exercising.  Continue to eat regular, healthy meals.  Wear a good support bra for breast tenderness.  Do not use hot tubs, steam rooms, or saunas.  Wear your seat belt at all times when driving.  Avoid raw meat, uncooked cheese, cat litter boxes, and soil used by cats. These carry germs that can cause birth defects in the baby.  Take your prenatal vitamins.  Try taking a stool softener (if your caregiver approves) if you develop constipation. Eat more high-fiber foods, such as fresh vegetables or fruit and whole grains.  Drink plenty of fluids to keep your urine clear or pale yellow.  Take warm sitz baths to soothe any pain or discomfort caused by hemorrhoids. Use hemorrhoid cream if your caregiver approves.  If you develop varicose veins, wear support hose. Elevate your feet for 15 minutes, 3-4 times a day. Limit salt in your diet.  Avoid heavy lifting, wear low heal shoes, and practice good posture.  Rest a lot with your legs elevated if you have leg cramps or low back pain.  Visit your dentist if you have not gone during your pregnancy. Use a soft toothbrush to brush your teeth and be gentle when you floss.  A sexual relationship may be continued unless your caregiver directs you otherwise.  Do not travel far distances unless it is absolutely necessary and only with the approval of your caregiver.  Take prenatal classes to understand, practice, and ask questions about the labor and delivery.  Make a trial run to the hospital.  Pack your hospital bag.  Prepare the baby's nursery.  Continue to go to all your prenatal visits as directed by your caregiver. SEEK  MEDICAL CARE IF:  You are unsure if you are in labor or if your water has broken.  You have dizziness.  You have mild pelvic cramps, pelvic pressure, or nagging pain in your abdominal area.  You have persistent nausea, vomiting, or diarrhea.  You have a bad smelling vaginal discharge.  You have pain with urination. SEEK IMMEDIATE MEDICAL CARE IF:   You have a fever.  You are leaking fluid from your vagina.  You have spotting or bleeding from your vagina.  You have severe abdominal cramping or pain.  You have rapid weight loss or gain.  You have shortness of breath with chest pain.  You notice sudden or extreme swelling of your face, hands, ankles, feet, or legs.  You have not felt your baby move in over an hour.  You have severe headaches that do not go away with medicine.  You have vision changes. Document Released: 05/12/2001 Document Revised: 05/23/2013 Document Reviewed: 07/19/2012 [facility] Patient Information 2015 [place], [place]. This information is not intended to replace advice given to you by your health care provider. Make sure you discuss any questions you have with your health care provider.   Gestational Diabetes Mellitus, Self Care Caring for yourself after you have been diagnosed with gestational diabetes (gestational diabetes mellitus) means keeping your blood sugar (glucose) under control with a balance of:  Nutrition.  Exercise.  Lifestyle changes.  Medicines or insulin, if necessary.  Support from your team of health care providers and others.  The following information explains what you need to know to manage your gestational diabetes at home. What do I need to do to manage my blood glucose?  Check your blood glucose every day during your pregnancy. Do this as often as told by your health care provider.  Contact your health care provider if your blood glucose is above your target for 2 tests in a row. Your health care provider will set  individualized treatment goals for you. Generally, the goal of treatment is to maintain the following blood glucose levels during pregnancy:  After not eating for 8 hours (after fasting): at or below 95 mg/dL (5.3 mmol/L).  After meals (postprandial): ? One hour after a meal: at or below 140 mg/dL (7.8 mmol/L). ? Two hours after a meal: at or below 120 mg/dL (6.7 mmol/L).  A1c (hemoglobin A1c) level: 6-6.5%.  What  do I need to know about hyperglycemia and hypoglycemia? What is hyperglycemia? Hyperglycemia, also called high blood glucose, occurs when blood glucose is too high. Make sure you know the early signs of hyperglycemia, such as:  Increased thirst.  Hunger.  Feeling very tired.  Needing to urinate more often than usual.  Blurry vision.  What is hypoglycemia? Hypoglycemia, also called low blood glucose, occurswith a blood glucose level at or below 70 mg/dL (3.9 mmol/L). The risk for hypoglycemia increases during or after exercise, during sleep, during illness, and when skipping meals or not eating for a long time (fasting). It is important to know the symptoms of hypoglycemia and treat it right away. Always have a 15-gram rapid-acting carbohydrate snack with you to treat low blood glucose.Family members and close friends should also know the symptoms and should understand how to treat hypoglycemia, in case you are not able to treat yourself. What are the symptoms of hypoglycemia? Hypoglycemia symptoms can include:  Hunger.  Anxiety.  Sweating and feeling clammy.  Confusion.  Dizziness or feeling light-headed.  Sleepiness.  Nausea.  Increased heart rate.  Headache.  Blurry vision.  Seizure.  Nightmares.  Tingling or numbness around the mouth, lips, or tongue.  A change in speech.  Decreased ability to concentrate.  A change in coordination.  Restless sleep.  Tremors or shakes.  Fainting.  Irritability.  How do I treat hypoglycemia?  If you  are alert and able to swallow safely, follow the 15:15 rule:  Take 15 grams of a rapid-acting carbohydrate. Rapid-acting options include: ? 1 tube of glucose gel. ? 3 glucose pills. ? 6-8 pieces of hard candy. ? 4 oz (120 mL) of fruit juice. ? 4 oz (120 mL) of regular (not diet) soda.  Check your blood glucose 15 minutes after you take the carbohydrate.  If the repeat blood glucose level is still at or below 70 mg/dL (3.9 mmol/L), take 15 grams of a carbohydrate again.  If your blood glucose level does not increase above 70 mg/dL (3.9 mmol/L) after 3 tries, seek emergency medical care.  After your blood glucose level returns to normal, eat a meal or a snack within 1 hour.  How do I treat severe hypoglycemia? Severe hypoglycemia is when your blood glucose level is at or below 54 mg/dL (3 mmol/L). Severe hypoglycemia is an emergency. Do not wait to see if the symptoms will go away. Get medical help right away. Call your local emergency services (911 in the [place] Do not drive yourself to the hospital. If you have severe hypoglycemia and you cannot eat or drink, you may need an injection of glucagon. A family member or close friend should learn how to check your blood glucose and how to give you a glucagon injection. Ask your health care provider if you need to have an emergency glucagon injection kit available. Severe hypoglycemia may need to be treated in a hospital. The treatment may include getting glucose through an IV tube. You may also need treatment for the cause of your hypoglycemia. What else can I do to manage my gestational diabetes? Take your diabetes medicines as told  If your health care provider prescribed insulin or diabetes medicines, take them every day.  Do not run out of insulin or other diabetes medicines that you take. Plan ahead so you always have these available.  If you use insulin, adjust your dosage based on how physically active you are and what foods you eat.  Your health care provider  will tell you how to adjust your dosage. Make healthy food choices  The things that you eat and drink affect your blood glucose. Making good choices helps to control your diabetes and prevent other health problems. A healthy meal plan includes eating lean proteins, complex carbohydrates, fresh fruits and vegetables, low-fat dairy products, and healthy fats. Make an appointment to see a diet and nutrition specialist (registered dietitian) to help you create an eating plan that is right for you. Make sure that you:  Follow instructions from your health care provider about eating or drinking restrictions.  Drink enough fluid to keep your urine clear or pale yellow.  Eat healthy snacks between nutritious meals.  Track the carbohydrates that you eat. Do this by reading food labels and learning the standard serving sizes of foods.  Follow your sick day plan whenever you cannot eat or drink as usual. Make this plan in advance with your health care provider.  Stay active   Do at least 30 minutes of physical activity a day, or as much physical activity as your health care provider recommends during your pregnancy. ? Doing 10 minutes of exercise 30 minutes after each meal may help to control postprandial blood glucose levels.  If you start a new exercise or activity, work with your health care provider to adjust your insulin, medicines, or food intake as needed. Make healthy lifestyle choices  Do not drink alcohol.  Do not use any tobacco products, such as cigarettes, chewing tobacco, and e-cigarettes. If you need help quitting, ask your health care provider.  Learn to manage stress. If you need help with this, ask your health care provider. Care for your body  Keep your immunizations up to date.  Brush your teeth and gums two times a day, and floss at least one time a day.  Visit your dentist at least once every 6 months.  Maintain a healthy weight during your  pregnancy. General instructions   Take over-the-counter and prescription medicines only as told by your health care provider.  Talk with your health care provider about your risk for high blood pressure during pregnancy (preeclampsia or eclampsia).  Share your diabetes management plan with people in your workplace, school, and household.  Check your urine for ketones during your pregnancy when you are ill and as told by your health care provider.  Carry a medical alert card or wear medical alert jewelry.  Ask your health care provider: ? Do I need to meet with a diabetes educator? ? Where can I find a support group for people with diabetes?  Keep all follow-up visits during your pregnancy (prenatal) and after delivery (postnatal) as told by your health care provider. This is important. Get the care that you need after delivery  Have your blood glucose level checked 4-12 weeks after delivery. This is done with an oral glucose tolerance test (OGTT).  Get screened for diabetes at least every 3 years, or as often as told by your health care provider. Where to find more information: To learn more about gestational diabetes, visit:  American Diabetes Association (ADA): www.diabetes.org/diabetes-basics/gestational  Centers for Disease Control and Prevention (CDC): http://sanchez-watson.com/.pdf  This information is not intended to replace advice given to you by your health care provider. Make sure you discuss any questions you have with your health care provider. Document Released: 09/09/2015 Document Revised: 10/24/2015 Document Reviewed: 06/21/2015 Elsevier Interactive Patient Education  2018 [place] [facility]  [facility] [address] [place], [place] [address] Phone - [phone]   Fax - [place] [address] [place], [place]  [address] Phone - [phone]   Fax -  [phone]  [place] [address], [place] [address] [place], [place]  [address] Phone - [phone]   Fax - [phone]  [facility] [address] [place], [place]  [address] Phone - [phone]   Fax - [phone]  [place] [address], [place], [place]  [address] Phone - [phone]   Fax - [place] [address] [place], [place]  [address] Phone - [phone]   Fax - [place] [facility] [place] [address] [place], [place]  [address] Phone - [phone]   Fax - [phone]  [place] [address] [place], [place], [place]  [address] Phone - [phone]   Fax - [place] [address] [place], [place]  [address] Phone - [phone]   Fax - [phone] [facility] [place]. [address] [place], [place]  [address] Phone - [phone]   Fax - [phone]  [place] [address] [place], [place]  [address] Phone - [phone]   Fax - [phone]  [facility] [place], [place], [place]  [address] Phone - [phone]   Fax - [place] [address] [place], [place]  [address] Phone - [phone]   Fax - [phone]  [facility] [address], [place], [place]  [address] Phone - [place], [place]  [address] Phone - [phone]   Fax - [place] [address], [place], [place]  [address] Phone - [phone]   Fax - [phone]  [place] [address] [place], [place]  [address] Phone - [phone]   Fax - [place], [place]  [address] Phone - [phone]   Fax - [place], [place], [place]  [address] Phone - [phone]   Fax - [place] [address], [place], [place]  [address] Phone - [phone]   Fax - [phone]  [place] [address] [place], [place]  [address] Phone - [phone]   Fax - [place] [address] [place], [place]  [address] Phone - [phone]   Fax - [phone]  [place] [address] [place], [place]  [address] Phone - [phone]   Fax - [phone] [place] [address] [place],   [address] Phone - [phone]   Fax - [place] [address] [place],   [address] Phone - [phone]   Fax - [place] [address], [place],   [address] Phone - [phone]   Fax - [phone]

## 2017-07-15 ENCOUNTER — Telehealth: Payer: Self-pay | Admitting: Advanced Practice Midwife

## 2017-07-15 ENCOUNTER — Encounter: Payer: Medicaid Other | Attending: Advanced Practice Midwife | Admitting: Nutrition

## 2017-07-15 ENCOUNTER — Encounter: Payer: Self-pay | Admitting: Nutrition

## 2017-07-15 VITALS — Ht 64.0 in | Wt 264.0 lb

## 2017-07-15 DIAGNOSIS — O24419 Gestational diabetes mellitus in pregnancy, unspecified control: Secondary | ICD-10-CM | POA: Insufficient documentation

## 2017-07-15 DIAGNOSIS — Z713 Dietary counseling and surveillance: Secondary | ICD-10-CM | POA: Insufficient documentation

## 2017-07-15 DIAGNOSIS — O24410 Gestational diabetes mellitus in pregnancy, diet controlled: Secondary | ICD-10-CM

## 2017-07-15 DIAGNOSIS — E669 Obesity, unspecified: Secondary | ICD-10-CM

## 2017-07-15 NOTE — Progress Notes (Signed)
Diabetes Self-Management Education  Visit Type: First/Initial  Appt. Start Time: 1030 Appt. End Time: 1200  07/15/2017  [name], identified by name and date of birth, is a 27 y.o. female with a diagnosis of Diabetes: Gestational Diabetes.  [facility] April 2019.  G4, P 3. Works at [employer]. Notes she has gained about 30-40 lbs during the pregnancy. No GDM in previous pregnancies. No babies over 9 lbs previously.    Eats 3 meals per day. Does eat a lot of fast food. Only on Iron and prenatal vitamins. Family history of Type 2 DM.     Current diet is insuffient to meet her needs. BMI > 30 prior to pregnancy.  Wants to lose weigh after pregnancy.   .Results for Linda, Skinner (MRN [id number]) as of 07/15/2017 12:55  Ref. Range 06/16/2017 08:39  Glucose, 1 hour Latest Ref Range: 65 - 179 mg/dL 829  Results for Linda, Skinner (MRN [id number]) as of 07/15/2017 12:55  Ref. Range 06/16/2017 08:39  Glucose, Fasting Latest Ref Range: 65 - 91 mg/dL 99 (H)  Glucose, 2 hour Latest Ref Range: 65 - 152 mg/dL 784   ASSESSMENT  Height 5\' 4"  (1.626 m), weight 264 lb (119.7 kg), last menstrual period 12/15/2016. Body mass index is 45.32 kg/m.  Diabetes Self-Management Education - 07/15/17 1045      Visit Information   Visit Type  First/Initial      Initial Visit   Diabetes Type  Gestational Diabetes    Are you currently following a meal plan?  No    Are you taking your medications as prescribed?  Not on Medications    Date Diagnosed  Feb 2019      Health Coping   How would you rate your overall health?  Good      Psychosocial Assessment   Patient Belief/Attitude about Diabetes  Motivated to manage diabetes    Self-care barriers  None    Other persons present  Patient    Patient Concerns  Nutrition/Meal planning;Medication;Monitoring;Healthy Lifestyle;Weight Control    Special Needs  None    Preferred Learning Style  No preference indicated    Learning Readiness  Change in progress    How  often do you need to have someone help you when you read instructions, pamphlets, or other written materials from your doctor or pharmacy?  1 - Never    What is the last grade level you completed in school?  12      Pre-Education Assessment   Patient understands the diabetes disease and treatment process.  Needs Instruction    Patient understands incorporating nutritional management into lifestyle.  Needs Instruction    Patient undertands incorporating physical activity into lifestyle.  Needs Instruction    Patient understands using medications safely.  Needs Instruction    Patient understands monitoring blood glucose, interpreting and using results  Needs Instruction    Patient understands prevention, detection, and treatment of acute complications.  Needs Instruction    Patient understands prevention, detection, and treatment of chronic complications.  Needs Instruction    Patient understands how to develop strategies to address psychosocial issues.  Needs Instruction    Patient understands how to develop strategies to promote health/change behavior.  Needs Instruction      Complications   How often do you check your blood sugar?  0 times/day (not testing)    Have you had a dilated eye exam in the past 12 months?  No    Have you had a  dental exam in the past 12 months?  No    Are you checking your feet?  Yes    How many days per week are you checking your feet?  7      Dietary Intake   Breakfast  Cherrios with milk, and fruit, water    Snack (morning)  fruit    Lunch  Cheeseburger and tropical sprite and water,     Snack (afternoon)  water    [employer] sesame chicken with rice 1 cup and broccoli and water    Beverage(s)  water      Exercise   Exercise Type  ADL's      Patient Education   Previous Diabetes Education  No    Disease state   Factors that contribute to the development of diabetes;Explored patient's options for treatment of their diabetes    Nutrition  management   Role of diet in the treatment of diabetes and the relationship between the three main macronutrients and blood glucose level;Food label reading, portion sizes and measuring food.;Carbohydrate counting;Reviewed blood glucose goals for pre and post meals and how to evaluate the patients' food intake on their blood glucose level.;Meal timing in regards to the patients' current diabetes medication.    Physical activity and exercise   Role of exercise on diabetes management, blood pressure control and cardiac health.;Identified with patient nutritional and/or medication changes necessary with exercise.    Medications  Reviewed patients medication for diabetes, action, purpose, timing of dose and side effects.    Monitoring  Taught/evaluated SMBG meter.;Purpose and frequency of SMBG.;Taught/discussed recording of test results and interpretation of SMBG.;Identified appropriate SMBG and/or A1C goals.    Chronic complications  Relationship between chronic complications and blood glucose control    Preconception care  Pregnancy and GDM  Role of pre-pregnancy blood glucose control on the development of the fetus;Role of family planning for patients with diabetes    Personal strategies to promote health  Lifestyle issues that need to be addressed for better diabetes care      Individualized Goals (developed by patient)   Nutrition  Follow meal plan discussed    Physical Activity  Exercise 3-5 times per week;30 minutes per day    Monitoring   test my blood glucose as discussed    Reducing Risk  examine blood glucose patterns;do foot checks daily;treat hypoglycemia with 15 grams of carbs if blood glucose less than 70mg /dL      Post-Education Assessment   Patient understands the diabetes disease and treatment process.  Needs Review    Patient understands incorporating nutritional management into lifestyle.  Needs Review    Patient undertands incorporating physical activity into lifestyle.  Needs  Review    Patient understands using medications safely.  Needs Review    Patient understands monitoring blood glucose, interpreting and using results  Needs Review    Patient understands prevention, detection, and treatment of acute complications.  Needs Review    Patient understands prevention, detection, and treatment of chronic complications.  Needs Review    Patient understands how to develop strategies to address psychosocial issues.  Needs Review    Patient understands how to develop strategies to promote health/change behavior.  Needs Review      Outcomes   Expected Outcomes  Demonstrated interest in learning. Expect positive outcomes    Future DMSE  -- 1 week    Program Status  Completed      Blood Glucose Monitoring Instruction  Assessment:  Primary concerns today: Patient here for instruction on Blood Glucose Monitoring. They   do not have their own meter at this time.  Meter Provided:   Yes  If Yes, Brand: Accucheck Guide Lot #: [id number] Expiration Date:07/03/2018  Medications:  See list     Intervention:    Explained rationale of testing BG to obtain data as to how their diabetes is being managed.  Provided Target Ranges for both pre and post meals  Explained factors that effect BG including food (carbohydrate), stress, activity level and insulin availability in the body including diabetes medications  Taught patient techniques for using BG monitor and lancing device  Discussed need for Rx for strips and lancets   Explained rationale of recording BG both for patient and MD to assess patterns as needed.  Follow Up: Patient offered follow up as needed.  Individualized Plan for Diabetes Self-Management Training:   Learning Objective:  Patient will have a greater understanding of diabetes self-management. Patient education plan is to attend individual and/or group sessions per assessed needs and concerns.   Plan: Goals. Eat meals and snacks as discussed. Test blood  sugars before breakfast and 2 hours after each meals. Record readings on log sheets and bring meter and logs to all appts. Drink only water  Increase fresh fruits and vegetables. Cut out sodas, sweets, junk food. No fruit or milk with breakfast. Goals Before meals 60-90's and 2 hours post meals less than 120 mg/dl. Walk 30 minutes a day.    Expected Outcomes:  Demonstrated interest in learning. Expect positive outcomes  Education material provided: Meal plan card, My Plate and Carbohydrate counting sheet Gestational Packet  If problems or questions, patient to contact team via:  Phone and Email  Future DSME appointment: (1 week)

## 2017-07-15 NOTE — Telephone Encounter (Signed)
Patient called stating that she needs for us to call her in some strips and needles for her meter, pt states she has Just left the class. I for got to ask pt what meter she uses. Pt states she uses the [facility] on [address] Please contact pt

## 2017-07-15 NOTE — Patient Instructions (Signed)
Goals. Eat meals and snacks as discussed. Test blood sugars before breakfast and 2 hours after each meals. Record readings on log sheets and bring meter and logs to all appts. Drink only water  Increase fresh fruits and vegetables. Cut out sodas, sweets, junk food. No fruit or milk with breakfast. Goals Before meals 60-90's and 2 hours post meals less than 120 mg/dl. Walk 30 minutes a day.

## 2017-07-15 NOTE — Telephone Encounter (Signed)
Lancets and strips called for Accu check guide meter to [facility].

## 2017-07-22 ENCOUNTER — Ambulatory Visit (INDEPENDENT_AMBULATORY_CARE_PROVIDER_SITE_OTHER): Payer: Medicaid Other | Admitting: Advanced Practice Midwife

## 2017-07-22 VITALS — BP 110/56 | HR 115 | Wt 263.0 lb

## 2017-07-22 DIAGNOSIS — O24410 Gestational diabetes mellitus in pregnancy, diet controlled: Secondary | ICD-10-CM

## 2017-07-22 DIAGNOSIS — Z1389 Encounter for screening for other disorder: Secondary | ICD-10-CM | POA: Diagnosis not present

## 2017-07-22 DIAGNOSIS — Z3A31 31 weeks gestation of pregnancy: Secondary | ICD-10-CM | POA: Diagnosis not present

## 2017-07-22 DIAGNOSIS — Z331 Pregnant state, incidental: Secondary | ICD-10-CM

## 2017-07-22 DIAGNOSIS — O0993 Supervision of high risk pregnancy, unspecified, third trimester: Secondary | ICD-10-CM

## 2017-07-22 LAB — POCT URINALYSIS DIPSTICK
Glucose, UA: NEGATIVE
LEUKOCYTES UA: NEGATIVE
Nitrite, UA: NEGATIVE
PROTEIN UA: NEGATIVE
RBC UA: NEGATIVE

## 2017-07-22 NOTE — Patient Instructions (Signed)
Drusilla KannerStacey Caples, I greatly value your feedback.  If you receive a survey following your visit with us today, we appreciate you taking the time to fill it out.  Thanks, Cathie BeamsFran Cresenzo-Dishmon, CNM   Call the office [phone]) or go to [facility] if:  You begin to have strong, frequent contractions  Your water breaks.  Sometimes it is a big gush of fluid, sometimes it is just a trickle that keeps getting your panties wet or running down your legs  You have vaginal bleeding.  It is normal to have a small amount of spotting if your cervix was checked.   You don't feel your baby moving like normal.  If you don't, get you something to eat and drink and lay down and focus on feeling your baby move.  You should feel at least 10 movements in 2 hours.  If you don't, you should call the office or go to [facility].    Tdap Vaccine  It is recommended that you get the Tdap vaccine during the third trimester of EACH pregnancy to help protect your baby from getting pertussis (whooping cough)  27-36 weeks is the BEST time to do this so that you can pass the protection on to your baby. During pregnancy is better than after pregnancy, but if you are unable to get it during pregnancy it will be offered at the hospital.   You can get this vaccine at the health department or your family doctor  Everyone who will be around your baby should also be up-to-date on their vaccines. Adults (who are not pregnant) only need 1 dose of Tdap during adulthood.   Third Trimester of Pregnancy The third trimester is from week 29 through week 42, months 7 through 9. The third trimester is a time when the fetus is growing rapidly. At the end of the ninth month, the fetus is about 20 inches in length and weighs 6-10 pounds.  BODY CHANGES Your body goes through many changes during pregnancy. The changes vary from woman to woman.   Your weight will continue to increase. You can expect to gain 25-35 pounds (11-16 kg) by  the end of the pregnancy.  You may begin to get stretch marks on your hips, abdomen, and breasts.  You may urinate more often because the fetus is moving lower into your pelvis and pressing on your bladder.  You may develop or continue to have heartburn as a result of your pregnancy.  You may develop constipation because certain hormones are causing the muscles that push waste through your intestines to slow down.  You may develop hemorrhoids or swollen, bulging veins (varicose veins).  You may have pelvic pain because of the weight gain and pregnancy hormones relaxing your joints between the bones in your pelvis. Backaches may result from overexertion of the muscles supporting your posture.  You may have changes in your hair. These can include thickening of your hair, rapid growth, and changes in texture. Some women also have hair loss during or after pregnancy, or hair that feels dry or thin. Your hair will most likely return to normal after your baby is born.  Your breasts will continue to grow and be tender. A yellow discharge may leak from your breasts called colostrum.  Your belly button may stick out.  You may feel short of breath because of your expanding uterus.  You may notice the fetus "dropping," or moving lower in your abdomen.  You may have a bloody mucus discharge.  This usually occurs a few days to a week before labor begins.  Your cervix becomes thin and soft (effaced) near your due date. WHAT TO EXPECT AT YOUR PRENATAL EXAMS  You will have prenatal exams every 2 weeks until week 36. Then, you will have weekly prenatal exams. During a routine prenatal visit:  You will be weighed to make sure you and the fetus are growing normally.  Your blood pressure is taken.  Your abdomen will be measured to track your baby's growth.  The fetal heartbeat will be listened to.  Any test results from the previous visit will be discussed.  You may have a cervical check near your  due date to see if you have effaced. At around 36 weeks, your caregiver will check your cervix. At the same time, your caregiver will also perform a test on the secretions of the vaginal tissue. This test is to determine if a type of bacteria, Group B streptococcus, is present. Your caregiver will explain this further. Your caregiver may ask you:  What your birth plan is.  How you are feeling.  If you are feeling the baby move.  If you have had any abnormal symptoms, such as leaking fluid, bleeding, severe headaches, or abdominal cramping.  If you have any questions. Other tests or screenings that may be performed during your third trimester include:  Blood tests that check for low iron levels (anemia).  Fetal testing to check the health, activity level, and growth of the fetus. Testing is done if you have certain medical conditions or if there are problems during the pregnancy. FALSE LABOR You may feel small, irregular contractions that eventually go away. These are called Braxton Hicks contractions, or false labor. Contractions may last for hours, days, or even weeks before true labor sets in. If contractions come at regular intervals, intensify, or become painful, it is best to be seen by your caregiver.  SIGNS OF LABOR   Menstrual-like cramps.  Contractions that are 5 minutes apart or less.  Contractions that start on the top of the uterus and spread down to the lower abdomen and back.  A sense of increased pelvic pressure or back pain.  A watery or bloody mucus discharge that comes from the vagina. If you have any of these signs before the 37th week of pregnancy, call your caregiver right away. You need to go to the hospital to get checked immediately. HOME CARE INSTRUCTIONS   Avoid all smoking, herbs, alcohol, and unprescribed drugs. These chemicals affect the formation and growth of the baby.  Follow your caregiver's instructions regarding medicine use. There are medicines  that are either safe or unsafe to take during pregnancy.  Exercise only as directed by your caregiver. Experiencing uterine cramps is a good sign to stop exercising.  Continue to eat regular, healthy meals.  Wear a good support bra for breast tenderness.  Do not use hot tubs, steam rooms, or saunas.  Wear your seat belt at all times when driving.  Avoid raw meat, uncooked cheese, cat litter boxes, and soil used by cats. These carry germs that can cause birth defects in the baby.  Take your prenatal vitamins.  Try taking a stool softener (if your caregiver approves) if you develop constipation. Eat more high-fiber foods, such as fresh vegetables or fruit and whole grains. Drink plenty of fluids to keep your urine clear or pale yellow.  Take warm sitz baths to soothe any pain or discomfort caused by hemorrhoids. Use hemorrhoid  cream if your caregiver approves.  If you develop varicose veins, wear support hose. Elevate your feet for 15 minutes, 3-4 times a day. Limit salt in your diet.  Avoid heavy lifting, wear low heal shoes, and practice good posture.  Rest a lot with your legs elevated if you have leg cramps or low back pain.  Visit your dentist if you have not gone during your pregnancy. Use a soft toothbrush to brush your teeth and be gentle when you floss.  A sexual relationship may be continued unless your caregiver directs you otherwise.  Do not travel far distances unless it is absolutely necessary and only with the approval of your caregiver.  Take prenatal classes to understand, practice, and ask questions about the labor and delivery.  Make a trial run to the hospital.  Pack your hospital bag.  Prepare the baby's nursery.  Continue to go to all your prenatal visits as directed by your caregiver. SEEK MEDICAL CARE IF:  You are unsure if you are in labor or if your water has broken.  You have dizziness.  You have mild pelvic cramps, pelvic pressure, or nagging  pain in your abdominal area.  You have persistent nausea, vomiting, or diarrhea.  You have a bad smelling vaginal discharge.  You have pain with urination. SEEK IMMEDIATE MEDICAL CARE IF:   You have a fever.  You are leaking fluid from your vagina.  You have spotting or bleeding from your vagina.  You have severe abdominal cramping or pain.  You have rapid weight loss or gain.  You have shortness of breath with chest pain.  You notice sudden or extreme swelling of your face, hands, ankles, feet, or legs.  You have not felt your baby move in over an hour.  You have severe headaches that do not go away with medicine.  You have vision changes. Document Released: 05/12/2001 Document Revised: 05/23/2013 Document Reviewed: 07/19/2012 [facility] Patient Information 2015 [place], [place]. This information is not intended to replace advice given to you by your health care provider. Make sure you discuss any questions you have with your health care provider.

## 2017-07-22 NOTE — Progress Notes (Signed)
HIGH-RISK PREGNANCY VISIT Patient name: Linda Skinner MRN [id number]  Date of birth: 02/04/1991 Chief Complaint:   High Risk Gestation  History of Present Illness:   Linda Skinner is a 27 y.o. [id number] female at 8326w2d with an Estimated Date of Delivery: 09/21/17 being seen today for ongoing management of a high-risk pregnancy complicated by gestational DM, class  A1 DM.  Today she reports no complaints. Contractions: Not present. Vag. Bleeding: None.  Movement: Present. denies leaking of fluid.   FBS: 110/100/89 90/93/99 and 2hr PP 138/134/129, the rest normal Review of Systems:   Pertinent items are noted in HPI Denies abnormal vaginal discharge w/ itching/odor/irritation, headaches, visual changes, shortness of breath, chest pain, abdominal pain, severe nausea/vomiting, or problems with urination or bowel movements unless otherwise stated above.    Pertinent History Reviewed:  Medical & Surgical Hx:   Past Medical History:  Diagnosis Date  . Medical history non-contributory    Past Surgical History:  Procedure Laterality Date  . MOUTH SURGERY    . WISDOM TOOTH EXTRACTION     Family History  Problem Relation Age of Onset  . Lupus Mother   . Cancer Paternal Grandfather   . Atrial fibrillation Maternal Grandmother     Current Outpatient Medications:  .  ferrous sulfate 325 (65 FE) MG tablet, Take 1 tablet (325 mg total) by mouth 2 (two) times daily with a meal., Disp: 60 tablet, Rfl: 3 .  imiquimod (ALDARA) 5 % cream, Apply topically 3 (three) times a week., Disp: 12 each, Rfl: 3 .  prenatal vitamin w/FE, FA (PRENATAL 1 + 1) 27-1 MG TABS tablet, Take 1 tablet by mouth daily at 12 noon., Disp: 30 each, Rfl: 12 Social History: Reviewed -  reports that she has quit smoking. Her smoking use included cigars. She has a 0.04 pack-year smoking history. she has never used smokeless tobacco.   Physical Assessment:   Vitals:   07/22/17 1343  BP: (!) 110/56  Pulse: (!) 115  Weight: 263 lb  (119.3 kg)  Body mass index is 45.14 kg/m.           Physical Examination:   General appearance: alert, well appearing, and in no distress  Mental status: alert, oriented to person, place, and time  Skin: warm & dry   Extremities: Edema: None    Cardiovascular: normal heart rate noted  Respiratory: normal respiratory effort, no distress  Abdomen: gravid, soft, non-tender  Pelvic: Cervical exam deferred         Fetal Status: Fetal Heart Rate (bpm): 140 Fundal Height: 33 cm Movement: Present    Fetal Surveillance Testing today: doppler  Results for orders placed or performed in visit on 07/22/17 (from the past 24 hour(s))  POCT Urinalysis Dipstick   Collection Time: 07/22/17  1:57 PM  Result Value Ref Range   Color, UA     Clarity, UA     Glucose, UA neg    Bilirubin, UA     Ketones, UA mod    Spec Grav, UA  1.010 - 1.025   Blood, UA neg    pH, UA  5.0 - 8.0   Protein, UA neg    Urobilinogen, UA  0.2 or 1.0 E.U./dL   Nitrite, UA neg    Leukocytes, UA Negative Negative   Appearance     Odor      Assessment & Plan:  1) High-risk pregnancy [id number] at 7526w2d with an Estimated Date of Delivery: 09/21/17   2) A1Dm, borderline FBS;  may need nighttime med if the remain elevated  3) ,   Labs/procedures today: none  Medications: none  Treatment Plan:  Continue QID blood sugar testing, EFW 36-38 weeks, IOL 40 weeks   Follow-up: Return in about 2 weeks (around 08/05/2017) for HROB.  Orders Placed This Encounter  Procedures  . POCT Urinalysis Dipstick   Jacklyn Shell CNM 07/22/2017 2:20 PM

## 2017-07-26 ENCOUNTER — Encounter: Payer: Medicaid Other | Attending: Advanced Practice Midwife | Admitting: Nutrition

## 2017-07-26 VITALS — Ht 64.0 in | Wt 265.0 lb

## 2017-07-26 DIAGNOSIS — Z713 Dietary counseling and surveillance: Secondary | ICD-10-CM | POA: Insufficient documentation

## 2017-07-26 DIAGNOSIS — E669 Obesity, unspecified: Secondary | ICD-10-CM

## 2017-07-26 DIAGNOSIS — E119 Type 2 diabetes mellitus without complications: Secondary | ICD-10-CM | POA: Insufficient documentation

## 2017-07-26 DIAGNOSIS — O24410 Gestational diabetes mellitus in pregnancy, diet controlled: Secondary | ICD-10-CM

## 2017-07-26 NOTE — Progress Notes (Signed)
Diabetes Self-Management Education  Visit Type:  Follow-up  Appt. Start Time: 1500 Appt. End Time: 1515  07/26/2017  Linda Skinner, identified by name and date of birth, is a 27 y.o. female with a diagnosis of Diabetes:  .   ASSESSMENT Gained 2 lbs. BS are looking good. FBS 91-103 mg/dl. 2 hr post meals are less than 120 mg/d 99% of the time. She is drinking water. Walks some at [employer] for exercise. Eating more vegetables. Avoiding late night snacks.  Height 5\' 4"  (1.626 m), weight 265 lb (120.2 kg), last menstrual period 12/15/2016. Body mass index is 45.49 kg/m.   Diabetes Self-Management Education - 07/26/17 1507      Complications   Fasting Blood glucose range (mg/dL)  45-409    Postprandial Blood glucose range (mg/dL)  81-191      Dietary Intake   Breakfast  Egg and wheat bread, water    Snack (morning)  yogurt    Lunch  baked chiken and salad, water     Snack (afternoon)  apple    Dinner  baked chicken, salad, water,     Beverage(s)  water      Exercise   Exercise Type  Light (walking / raking leaves)    How many days per week to you exercise?  3    How many minutes per day do you exercise?  3    Total minutes per week of exercise  9      Patient Education   Nutrition management   Food label reading, portion sizes and measuring food.;Carbohydrate counting;Information on hints to eating out and maintain blood glucose control.    Physical activity and exercise   Role of exercise on diabetes management, blood pressure control and cardiac health.;Helped patient identify appropriate exercises in relation to his/her diabetes, diabetes complications and other health issue.    Acute complications  Discussed and identified patients' treatment of hyperglycemia.    Preconception care  Role of family planning for patients with diabetes;Reviewed with patient blood glucose goals with pregnancy      Individualized Goals (developed by patient)   Nutrition  Follow meal plan  discussed    Physical Activity  Exercise 3-5 times per week;30 minutes per day    Monitoring   test my blood glucose as discussed    Reducing Risk  examine blood glucose patterns;do foot checks daily      Patient Self-Evaluation of Goals - Patient rates self as meeting previously set goals (% of time)   Nutrition  >75%    Physical Activity  >75%    Medications  >75%    Monitoring  >75%    Problem Solving  >75%    Reducing Risk  >75%    Health Coping  >75%      Post-Education Assessment   Patient understands the diabetes disease and treatment process.  Demonstrates understanding / competency    Patient understands incorporating nutritional management into lifestyle.  Demonstrates understanding / competency    Patient undertands incorporating physical activity into lifestyle.  Demonstrates understanding / competency    Patient understands using medications safely.  Demonstrates understanding / competency    Patient understands monitoring blood glucose, interpreting and using results  Demonstrates understanding / competency    Patient understands prevention, detection, and treatment of acute complications.  Demonstrates understanding / competency    Patient understands prevention, detection, and treatment of chronic complications.  Demonstrates understanding / competency    Patient understands how to develop strategies  to address psychosocial issues.  Demonstrates understanding / competency    Patient understands how to develop strategies to promote health/change behavior.  Demonstrates understanding / competency      Outcomes   Program Status  Completed       Learning Objective:  Patient will have a greater understanding of diabetes self-management. Patient education plan is to attend individual and/or group sessions per assessed needs and concerns.   Plan:   Patient Instructions  Goals 1. Keep up the good job! 2. Eat 30-45 grams of carbs per meal 3. Eat protein and 15 grams of  carbs for snacks between meals. 4. Keep drinking water Walk 30-60 minutes 3-5 times per week Let MD know if  BS are higher than 100 before breakfast or > 120 mg/dl 2  Hrs after meals.     Expected Outcomes:  Demonstrated interest in learning. Expect positive outcomes  Education material provided: My Plate  If problems or questions, patient to contact team via:  Phone and Email  Future DSME appointment: - PRN

## 2017-07-26 NOTE — Patient Instructions (Signed)
Goals 1. Keep up the good job! 2. Eat 30-45 grams of carbs per meal 3. Eat protein and 15 grams of carbs for snacks between meals. 4. Keep drinking water Walk 30-60 minutes 3-5 times per week Let MD know if  BS are higher than 100 before breakfast or > 120 mg/dl 2  Hrs after meals.

## 2017-07-29 ENCOUNTER — Encounter: Payer: Self-pay | Admitting: Nutrition

## 2017-08-05 ENCOUNTER — Ambulatory Visit (INDEPENDENT_AMBULATORY_CARE_PROVIDER_SITE_OTHER): Payer: Medicaid Other | Admitting: Women's Health

## 2017-08-05 ENCOUNTER — Encounter: Payer: Self-pay | Admitting: Women's Health

## 2017-08-05 VITALS — BP 100/60 | HR 93 | Wt 268.4 lb

## 2017-08-05 DIAGNOSIS — O9989 Other specified diseases and conditions complicating pregnancy, childbirth and the puerperium: Secondary | ICD-10-CM

## 2017-08-05 DIAGNOSIS — O24415 Gestational diabetes mellitus in pregnancy, controlled by oral hypoglycemic drugs: Secondary | ICD-10-CM

## 2017-08-05 DIAGNOSIS — Z3A33 33 weeks gestation of pregnancy: Secondary | ICD-10-CM | POA: Diagnosis not present

## 2017-08-05 DIAGNOSIS — Z331 Pregnant state, incidental: Secondary | ICD-10-CM

## 2017-08-05 DIAGNOSIS — Z1389 Encounter for screening for other disorder: Secondary | ICD-10-CM | POA: Diagnosis not present

## 2017-08-05 DIAGNOSIS — O24419 Gestational diabetes mellitus in pregnancy, unspecified control: Secondary | ICD-10-CM

## 2017-08-05 DIAGNOSIS — M5431 Sciatica, right side: Secondary | ICD-10-CM | POA: Diagnosis not present

## 2017-08-05 DIAGNOSIS — O0993 Supervision of high risk pregnancy, unspecified, third trimester: Secondary | ICD-10-CM

## 2017-08-05 LAB — POCT URINALYSIS DIPSTICK
Blood, UA: NEGATIVE
GLUCOSE UA: NEGATIVE
Ketones, UA: NEGATIVE
LEUKOCYTES UA: NEGATIVE
NITRITE UA: NEGATIVE

## 2017-08-05 MED ORDER — METFORMIN HCL 500 MG PO TABS: 500.0000 mg | ORAL_TABLET | Freq: Every day | ORAL | 6 refills | Status: DC

## 2017-08-05 NOTE — Progress Notes (Signed)
HIGH-RISK PREGNANCY VISIT Patient name: Linda Skinner MRN [id number]  Date of birth: 06-04-1990 Chief Complaint:   High Risk Gestation  History of Present Illness:   Clarita Mcelvain is a 27 y.o. [id number] female at [redacted]w[redacted]d with an Estimated Date of Delivery: 09/21/17 being seen today for ongoing management of a high-risk pregnancy complicated by A2DM- meds added today.  Today she reports forgot log, but reports majority of fbs >95, all but 4 2hr pp <120. Rt sciatica. Contractions: Not present.  .  Movement: Present. denies leaking of fluid.  Review of Systems:   Pertinent items are noted in HPI Denies abnormal vaginal discharge w/ itching/odor/irritation, headaches, visual changes, shortness of breath, chest pain, abdominal pain, severe nausea/vomiting, or problems with urination or bowel movements unless otherwise stated above. Pertinent History Reviewed:  Reviewed past medical,surgical, social, obstetrical and family history.  Reviewed problem list, medications and allergies. Physical Assessment:   Vitals:   08/05/17 1015  BP: 100/60  Pulse: 93  Weight: 268 lb 6.4 oz (121.7 kg)  Body mass index is 46.07 kg/m.           Physical Examination:   General appearance: alert, well appearing, and in no distress  Mental status: alert, oriented to person, place, and time  Skin: warm & dry   Extremities: Edema: None    Cardiovascular: normal heart rate noted  Respiratory: normal respiratory effort, no distress  Abdomen: gravid, soft, non-tender  Pelvic: Cervical exam deferred         Fetal Status: Fetal Heart Rate (bpm): 136 Fundal Height: 33 cm Movement: Present    Fetal Surveillance Testing today: doppler   Results for orders placed or performed in visit on 08/05/17 (from the past 24 hour(s))  POCT urinalysis dipstick   Collection Time: 08/05/17 10:25 AM  Result Value Ref Range   Color, UA     Clarity, UA     Glucose, UA neg    Bilirubin, UA     Ketones, UA neg    Spec Grav, UA   1.010 - 1.025   Blood, UA neg    pH, UA  5.0 - 8.0   Protein, UA trace    Urobilinogen, UA  0.2 or 1.0 E.U./dL   Nitrite, UA neg    Leukocytes, UA Negative Negative   Appearance     Odor      Assessment & Plan:  1) High-risk pregnancy [id number] at [redacted]w[redacted]d with an Estimated Date of Delivery: 09/21/17   2) A2DM, meds added today, metformin 500mg  w/ supper  3) Rt sciatica, gave printed exercises  Meds:  Meds ordered this encounter  Medications  . metFORMIN (GLUCOPHAGE) 500 MG tablet    Sig: Take 1 tablet (500 mg total) by mouth daily with supper.    Dispense:  30 tablet    Refill:  6    Order Specific Question:   Supervising Provider    Answer:   Lazaro Arms [[id number]]    Labs/procedures today: none  Treatment Plan:  Growth u/s @ 20, 36-38wks     2x/wk testing @ 32wks or weekly BPP    Deliver @ 39wks:______   Reviewed: Preterm labor symptoms and general obstetric precautions including but not limited to vaginal bleeding, contractions, leaking of fluid and fetal movement were reviewed in detail with the patient.  All questions were answered.  Follow-up: Return for Mon/Thu for hrob/nst.  Orders Placed This Encounter  Procedures  . POCT urinalysis dipstick   Cheral Marker CNM,  WHNP-BC 08/05/2017 10:50 AM

## 2017-08-05 NOTE — Patient Instructions (Addendum)
Drusilla Kanner, I greatly value your feedback.  If you receive a survey following your visit with [place] today, we appreciate you taking the time to fill it out.  Thanks, Joellyn Haff, CNM, WHNP-BC   Call the office [phone]) or go to [facility] if:  You begin to have strong, frequent contractions  Your water breaks.  Sometimes it is a big gush of fluid, sometimes it is just a trickle that keeps getting your panties wet or running down your legs  You have vaginal bleeding.  It is normal to have a small amount of spotting if your cervix was checked.   You don't feel your baby moving like normal.  If you don't, get you something to eat and drink and lay down and focus on feeling your baby move.  You should feel at least 10 movements in 2 hours.  If you don't, you should call the office or go to [facility].    Tdap Vaccine  It is recommended that you get the Tdap vaccine during the third trimester of EACH pregnancy to help protect your baby from getting pertussis (whooping cough)  27-36 weeks is the BEST time to do this so that you can pass the protection on to your baby. During pregnancy is better than after pregnancy, but if you are unable to get it during pregnancy it will be offered at the hospital.   You can get this vaccine at the health department or your family doctor  Everyone who will be around your baby should also be up-to-date on their vaccines. Adults (who are not pregnant) only need 1 dose of Tdap during adulthood.   Third Trimester of Pregnancy The third trimester is from week 29 through week 42, months 7 through 9. The third trimester is a time when the fetus is growing rapidly. At the end of the ninth month, the fetus is about 20 inches in length and weighs 6-10 pounds.  BODY CHANGES Your body goes through many changes during pregnancy. The changes vary from woman to woman.   Your weight will continue to increase. You can expect to gain 25-35 pounds (11-16 kg) by the  end of the pregnancy.  You may begin to get stretch marks on your hips, abdomen, and breasts.  You may urinate more often because the fetus is moving lower into your pelvis and pressing on your bladder.  You may develop or continue to have heartburn as a result of your pregnancy.  You may develop constipation because certain hormones are causing the muscles that push waste through your intestines to slow down.  You may develop hemorrhoids or swollen, bulging veins (varicose veins).  You may have pelvic pain because of the weight gain and pregnancy hormones relaxing your joints between the bones in your pelvis. Backaches may result from overexertion of the muscles supporting your posture.  You may have changes in your hair. These can include thickening of your hair, rapid growth, and changes in texture. Some women also have hair loss during or after pregnancy, or hair that feels dry or thin. Your hair will most likely return to normal after your baby is born.  Your breasts will continue to grow and be tender. A yellow discharge may leak from your breasts called colostrum.  Your belly button may stick out.  You may feel short of breath because of your expanding uterus.  You may notice the fetus "dropping," or moving lower in your abdomen.  You may have a bloody mucus  discharge. This usually occurs a few days to a week before labor begins.  Your cervix becomes thin and soft (effaced) near your due date. WHAT TO EXPECT AT YOUR PRENATAL EXAMS  You will have prenatal exams every 2 weeks until week 36. Then, you will have weekly prenatal exams. During a routine prenatal visit:  You will be weighed to make sure you and the fetus are growing normally.  Your blood pressure is taken.  Your abdomen will be measured to track your baby's growth.  The fetal heartbeat will be listened to.  Any test results from the previous visit will be discussed.  You may have a cervical check near your due  date to see if you have effaced. At around 36 weeks, your caregiver will check your cervix. At the same time, your caregiver will also perform a test on the secretions of the vaginal tissue. This test is to determine if a type of bacteria, Group B streptococcus, is present. Your caregiver will explain this further. Your caregiver may ask you:  What your birth plan is.  How you are feeling.  If you are feeling the baby move.  If you have had any abnormal symptoms, such as leaking fluid, bleeding, severe headaches, or abdominal cramping.  If you have any questions. Other tests or screenings that may be performed during your third trimester include:  Blood tests that check for low iron levels (anemia).  Fetal testing to check the health, activity level, and growth of the fetus. Testing is done if you have certain medical conditions or if there are problems during the pregnancy. FALSE LABOR You may feel small, irregular contractions that eventually go away. These are called Braxton Hicks contractions, or false labor. Contractions may last for hours, days, or even weeks before true labor sets in. If contractions come at regular intervals, intensify, or become painful, it is best to be seen by your caregiver.  SIGNS OF LABOR   Menstrual-like cramps.  Contractions that are 5 minutes apart or less.  Contractions that start on the top of the uterus and spread down to the lower abdomen and back.  A sense of increased pelvic pressure or back pain.  A watery or bloody mucus discharge that comes from the vagina. If you have any of these signs before the 37th week of pregnancy, call your caregiver right away. You need to go to the hospital to get checked immediately. HOME CARE INSTRUCTIONS   Avoid all smoking, herbs, alcohol, and unprescribed drugs. These chemicals affect the formation and growth of the baby.  Follow your caregiver's instructions regarding medicine use. There are medicines that  are either safe or unsafe to take during pregnancy.  Exercise only as directed by your caregiver. Experiencing uterine cramps is a good sign to stop exercising.  Continue to eat regular, healthy meals.  Wear a good support bra for breast tenderness.  Do not use hot tubs, steam rooms, or saunas.  Wear your seat belt at all times when driving.  Avoid raw meat, uncooked cheese, cat litter boxes, and soil used by cats. These carry germs that can cause birth defects in the baby.  Take your prenatal vitamins.  Try taking a stool softener (if your caregiver approves) if you develop constipation. Eat more high-fiber foods, such as fresh vegetables or fruit and whole grains. Drink plenty of fluids to keep your urine clear or pale yellow.  Take warm sitz baths to soothe any pain or discomfort caused by hemorrhoids. Use  hemorrhoid cream if your caregiver approves.  If you develop varicose veins, wear support hose. Elevate your feet for 15 minutes, 3-4 times a day. Limit salt in your diet.  Avoid heavy lifting, wear low heal shoes, and practice good posture.  Rest a lot with your legs elevated if you have leg cramps or low back pain.  Visit your dentist if you have not gone during your pregnancy. Use a soft toothbrush to brush your teeth and be gentle when you floss.  A sexual relationship may be continued unless your caregiver directs you otherwise.  Do not travel far distances unless it is absolutely necessary and only with the approval of your caregiver.  Take prenatal classes to understand, practice, and ask questions about the labor and delivery.  Make a trial run to the hospital.  Pack your hospital bag.  Prepare the baby's nursery.  Continue to go to all your prenatal visits as directed by your caregiver. SEEK MEDICAL CARE IF:  You are unsure if you are in labor or if your water has broken.  You have dizziness.  You have mild pelvic cramps, pelvic pressure, or nagging pain  in your abdominal area.  You have persistent nausea, vomiting, or diarrhea.  You have a bad smelling vaginal discharge.  You have pain with urination. SEEK IMMEDIATE MEDICAL CARE IF:   You have a fever.  You are leaking fluid from your vagina.  You have spotting or bleeding from your vagina.  You have severe abdominal cramping or pain.  You have rapid weight loss or gain.  You have shortness of breath with chest pain.  You notice sudden or extreme swelling of your face, hands, ankles, feet, or legs.  You have not felt your baby move in over an hour.  You have severe headaches that do not go away with medicine.  You have vision changes. Document Released: 05/12/2001 Document Revised: 05/23/2013 Document Reviewed: 07/19/2012 [facility] Patient Information 2015 [place], [place]. This information is not intended to replace advice given to you by your health care provider. Make sure you discuss any questions you have with your health care provider.   Sciatica Rehab Ask your health care provider which exercises are safe for you. Do exercises exactly as told by your health care provider and adjust them as directed. It is normal to feel mild stretching, pulling, tightness, or discomfort as you do these exercises, but you should stop right away if you feel sudden pain or your pain gets worse.Do not begin these exercises until told by your health care provider. Stretching and range of motion exercises These exercises warm up your muscles and joints and improve the movement and flexibility of your hips and your back. These exercises also help to relieve pain, numbness, and tingling. Exercise A: Sciatic nerve glide 1. Sit in a chair with your head facing down toward your chest. Place your hands behind your back. Let your shoulders slump forward. 2. Slowly straighten one of your knees while you tilt your head back as if you are looking toward the ceiling. Only straighten your leg as far as you  can without making your symptoms worse. 3. Hold for __________ seconds. 4. Slowly return to the starting position. 5. Repeat with your other leg. Repeat __________ times. Complete this exercise __________ times a day. Exercise B: Knee to chest with hip adduction and internal rotation  1. Lie on your back on a firm surface with both legs straight. 2. Bend one of your knees and  move it up toward your chest until you feel a gentle stretch in your lower back and buttock. Then, move your knee toward the shoulder that is on the opposite side from your leg. ? Hold your leg in this position by holding onto the front of your knee. 3. Hold for __________ seconds. 4. Slowly return to the starting position. 5. Repeat with your other leg. Repeat __________ times. Complete this exercise __________ times a day. Exercise C: Prone extension on elbows  1. Lie on your abdomen on a firm surface. A bed may be too soft for this exercise. 2. Prop yourself up on your elbows. 3. Use your arms to help lift your chest up until you feel a gentle stretch in your abdomen and your lower back. ? This will place some of your body weight on your elbows. If this is uncomfortable, try stacking pillows under your chest. ? Your hips should stay down, against the surface that you are lying on. Keep your hip and back muscles relaxed. 4. Hold for __________ seconds. 5. Slowly relax your upper body and return to the starting position. Repeat __________ times. Complete this exercise __________ times a day. Strengthening exercises These exercises build strength and endurance in your back. Endurance is the ability to use your muscles for a long time, even after they get tired. Exercise D: Pelvic tilt 1. Lie on your back on a firm surface. Bend your knees and keep your feet flat. 2. Tense your abdominal muscles. Tip your pelvis up toward the ceiling and flatten your lower back into the floor. ? To help with this exercise, you may  place a small towel under your lower back and try to push your back into the towel. 3. Hold for __________ seconds. 4. Let your muscles relax completely before you repeat this exercise. Repeat __________ times. Complete this exercise __________ times a day. Exercise E: Alternating arm and leg raises  1. Get on your hands and knees on a firm surface. If you are on a hard floor, you may want to use padding to cushion your knees, such as an exercise mat. 2. Line up your arms and legs. Your hands should be below your shoulders, and your knees should be below your hips. 3. Lift your left leg behind you. At the same time, raise your right arm and straighten it in front of you. ? Do not lift your leg higher than your hip. ? Do not lift your arm higher than your shoulder. ? Keep your abdominal and back muscles tight. ? Keep your hips facing the ground. ? Do not arch your back. ? Keep your balance carefully, and do not hold your breath. 4. Hold for __________ seconds. 5. Slowly return to the starting position and repeat with your right leg and your left arm. Repeat __________ times. Complete this exercise __________ times a day. Posture and body mechanics  Body mechanics refers to the movements and positions of your body while you do your daily activities. Posture is part of body mechanics. Good posture and healthy body mechanics can help to relieve stress in your body's tissues and joints. Good posture means that your spine is in its natural S-curve position (your spine is neutral), your shoulders are pulled back slightly, and your head is not tipped forward. The following are general guidelines for applying improved posture and body mechanics to your everyday activities. Standing   When standing, keep your spine neutral and your feet about hip-width apart. Keep a slight  bend in your knees. Your ears, shoulders, and hips should line up.  When you do a task in which you stand in one place for a long  time, place one foot up on a stable object that is 2-4 inches (5-10 cm) high, such as a footstool. This helps keep your spine neutral. Sitting   When sitting, keep your spine neutral and keep your feet flat on the floor. Use a footrest, if necessary, and keep your thighs parallel to the floor. Avoid rounding your shoulders, and avoid tilting your head forward.  When working at a desk or a computer, keep your desk at a height where your hands are slightly lower than your elbows. Slide your chair under your desk so you are close enough to maintain good posture.  When working at a computer, place your monitor at a height where you are looking straight ahead and you do not have to tilt your head forward or downward to look at the screen. Resting   When lying down and resting, avoid positions that are most painful for you.  If you have pain with activities such as sitting, bending, stooping, or squatting (flexion-based activities), lie in a position in which your body does not bend very much. For example, avoid curling up on your side with your arms and knees near your chest (fetal position).  If you have pain with activities such as standing for a long time or reaching with your arms (extension-based activities), lie with your spine in a neutral position and bend your knees slightly. Try the following positions: ? Lying on your side with a pillow between your knees. ? Lying on your back with a pillow under your knees. Lifting   When lifting objects, keep your feet at least shoulder-width apart and tighten your abdominal muscles.  Bend your knees and hips and keep your spine neutral. It is important to lift using the strength of your legs, not your back. Do not lock your knees straight out.  Always ask for help to lift heavy or awkward objects. This information is not intended to replace advice given to you by your health care provider. Make sure you discuss any questions you have with your  health care provider. Document Released: 05/18/2005 Document Revised: 01/23/2016 Document Reviewed: 02/01/2015 [employer] Interactive Patient Education  [employer]2018 [employer]

## 2017-08-09 ENCOUNTER — Ambulatory Visit (INDEPENDENT_AMBULATORY_CARE_PROVIDER_SITE_OTHER): Payer: Medicaid Other | Admitting: Women's Health

## 2017-08-09 ENCOUNTER — Encounter: Payer: Self-pay | Admitting: Women's Health

## 2017-08-09 VITALS — BP 104/78 | HR 106 | Wt 267.5 lb

## 2017-08-09 DIAGNOSIS — Z331 Pregnant state, incidental: Secondary | ICD-10-CM

## 2017-08-09 DIAGNOSIS — Z1389 Encounter for screening for other disorder: Secondary | ICD-10-CM

## 2017-08-09 DIAGNOSIS — R51 Headache: Secondary | ICD-10-CM

## 2017-08-09 DIAGNOSIS — O24419 Gestational diabetes mellitus in pregnancy, unspecified control: Secondary | ICD-10-CM

## 2017-08-09 DIAGNOSIS — O24415 Gestational diabetes mellitus in pregnancy, controlled by oral hypoglycemic drugs: Secondary | ICD-10-CM | POA: Diagnosis not present

## 2017-08-09 DIAGNOSIS — Z3A33 33 weeks gestation of pregnancy: Secondary | ICD-10-CM

## 2017-08-09 DIAGNOSIS — O0993 Supervision of high risk pregnancy, unspecified, third trimester: Secondary | ICD-10-CM

## 2017-08-09 DIAGNOSIS — O9989 Other specified diseases and conditions complicating pregnancy, childbirth and the puerperium: Secondary | ICD-10-CM | POA: Diagnosis not present

## 2017-08-09 DIAGNOSIS — O0990 Supervision of high risk pregnancy, unspecified, unspecified trimester: Secondary | ICD-10-CM

## 2017-08-09 LAB — POCT URINALYSIS DIPSTICK
GLUCOSE UA: NEGATIVE
Ketones, UA: NEGATIVE
LEUKOCYTES UA: NEGATIVE
NITRITE UA: NEGATIVE
PROTEIN UA: NEGATIVE
RBC UA: NEGATIVE

## 2017-08-09 NOTE — Patient Instructions (Addendum)
Drusilla Kanner, I greatly value your feedback.  If you receive a survey following your visit with [place] today, we appreciate you taking the time to fill it out.  Thanks, Joellyn Haff, CNM, WHNP-BC  Increase metformin to 1,000mg  at night, 500mg  in the morning   Call the office [phone]) or go to [facility] if:  You begin to have strong, frequent contractions  Your water breaks.  Sometimes it is a big gush of fluid, sometimes it is just a trickle that keeps getting your panties wet or running down your legs  You have vaginal bleeding.  It is normal to have a small amount of spotting if your cervix was checked.   You don't feel your baby moving like normal.  If you don't, get you something to eat and drink and lay down and focus on feeling your baby move.  You should feel at least 10 movements in 2 hours.  If you don't, you should call the office or go to [facility].    For Headaches:   Stay well hydrated, drink enough water so that your urine is clear, sometimes if you are dehydrated you can get headaches  Eat small frequent meals and snacks, sometimes if you are hungry you can get headaches  Sometimes you get headaches during pregnancy from the pregnancy hormones  You can try tylenol (1-2 regular strength 325mg  or 1-2 extra strength 500mg ) as directed on the box. The least amount of medication that works is best.   Cool compresses (cool wet washcloth or ice pack) to area of head that is hurting  You can also try drinking a caffeinated drink to see if this will help  If not helping, try below:  For Prevention of Headaches/Migraines:  CoQ10 100mg  three times daily  Vitamin B2 400mg  daily  Magnesium Oxide 400-600mg  daily  If You Get a Bad Headache/Migraine:  Benadryl 25mg    Magnesium Oxide  1 large Gatorade  2 extra strength Tylenol (1,000mg  total)  1 cup coffee or Coke  If this doesn't help please call us @ [phone]   Preterm Labor and Birth  Information The normal length of a pregnancy is 39-41 weeks. Preterm labor is when labor starts before 37 completed weeks of pregnancy. What are the risk factors for preterm labor? Preterm labor is more likely to occur in women who:  Have certain infections during pregnancy such as a bladder infection, sexually transmitted infection, or infection inside the uterus (chorioamnionitis).  Have a shorter-than-normal cervix.  Have gone into preterm labor before.  Have had surgery on their cervix.  Are younger than age 80 or older than age 47.  Are African American.  Are pregnant with twins or multiple babies (multiple gestation).  Take street drugs or smoke while pregnant.  Do not gain enough weight while pregnant.  Became pregnant shortly after having been pregnant.  What are the symptoms of preterm labor? Symptoms of preterm labor include:  Cramps similar to those that can happen during a menstrual period. The cramps may happen with diarrhea.  Pain in the abdomen or lower back.  Regular uterine contractions that may feel like tightening of the abdomen.  A feeling of increased pressure in the pelvis.  Increased watery or bloody mucus discharge from the vagina.  Water breaking (ruptured amniotic sac).  Why is it important to recognize signs of preterm labor? It is important to recognize signs of preterm labor because babies who are born prematurely may not be fully developed. This can  put them at an increased risk for:  Long-term (chronic) heart and lung problems.  Difficulty immediately after birth with regulating body systems, including blood sugar, body temperature, heart rate, and breathing rate.  Bleeding in the brain.  Cerebral palsy.  Learning difficulties.  Death.  These risks are highest for babies who are born before 34 weeks of pregnancy. How is preterm labor treated? Treatment depends on the length of your pregnancy, your condition, and the health of  your baby. It may involve:  Having a stitch (suture) placed in your cervix to prevent your cervix from opening too early (cerclage).  Taking or being given medicines, such as: ? Hormone medicines. These may be given early in pregnancy to help support the pregnancy. ? Medicine to stop contractions. ? Medicines to help mature the baby's lungs. These may be prescribed if the risk of delivery is high. ? Medicines to prevent your baby from developing cerebral palsy.  If the labor happens before 34 weeks of pregnancy, you may need to stay in the hospital. What should I do if I think I am in preterm labor? If you think that you are going into preterm labor, call your health care provider right away. How can I prevent preterm labor in future pregnancies? To increase your chance of having a full-term pregnancy:  Do not use any tobacco products, such as cigarettes, chewing tobacco, and e-cigarettes. If you need help quitting, ask your health care provider.  Do not use street drugs or medicines that have not been prescribed to you during your pregnancy.  Talk with your health care provider before taking any herbal supplements, even if you have been taking them regularly.  Make sure you gain a healthy amount of weight during your pregnancy.  Watch for infection. If you think that you might have an infection, get it checked right away.  Make sure to tell your health care provider if you have gone into preterm labor before.  This information is not intended to replace advice given to you by your health care provider. Make sure you discuss any questions you have with your health care provider. Document Released: 08/08/2003 Document Revised: 10/29/2015 Document Reviewed: 10/09/2015 Elsevier Interactive Patient Education  2018 [employer]

## 2017-08-09 NOTE — Progress Notes (Signed)
HIGH-RISK PREGNANCY VISIT Patient name: Linda Skinner MRN [id number]  Date of birth: 05/29/1991 Chief Complaint:   High Risk Gestation (NST; sees colorful dots and then gets headache)  History of Present Illness:   Linda Skinner is a 27 y.o. [id number] female at 4440w6d with an Estimated Date of Delivery: 09/21/17 being seen today for ongoing management of a high-risk pregnancy complicated by A2DM metformin 500mg  w/ supper.  Today she reports FBS 90-109 (all but 1 >95), 2hr pp 87-152 (about 1/2 >120). Sat & Wynelle LinkSun saw colorful dots then got headache- states kids were being loud. Didn't have to take meds to help w/ headache. Contractions: Irregular. Vag. Bleeding: None.  Movement: Present. denies leaking of fluid.  Review of Systems:   Pertinent items are noted in HPI Denies abnormal vaginal discharge w/ itching/odor/irritation, headaches, visual changes, shortness of breath, chest pain, abdominal pain, severe nausea/vomiting, or problems with urination or bowel movements unless otherwise stated above. Pertinent History Reviewed:  Reviewed past medical,surgical, social, obstetrical and family history.  Reviewed problem list, medications and allergies. Physical Assessment:   Vitals:   08/09/17 1353  BP: 104/78  Pulse: (!) 106  Weight: 267 lb 8 oz (121.3 kg)  Body mass index is 45.92 kg/m.           Physical Examination:   General appearance: alert, well appearing, and in no distress  Mental status: alert, oriented to person, place, and time  Skin: warm & dry   Extremities: Edema: Trace    Cardiovascular: normal heart rate noted  Respiratory: normal respiratory effort, no distress  Abdomen: gravid, soft, non-tender  Pelvic: Cervical exam deferred         Fetal Status: Fetal Heart Rate (bpm): 135 Fundal Height: 35 cm Movement: Present    Fetal Surveillance Testing today: NST: FHR baseline 135 bpm, Variability: moderate, Accelerations:present, Decelerations:  Absent= Cat 1/Reactive Toco: ui     Results for orders placed or performed in visit on 08/09/17 (from the past 24 hour(s))  POCT urinalysis dipstick   Collection Time: 08/09/17  1:54 PM  Result Value Ref Range   Color, UA     Clarity, UA     Glucose, UA neg    Bilirubin, UA     Ketones, UA neg    Spec Grav, UA  1.010 - 1.025   Blood, UA neg    pH, UA  5.0 - 8.0   Protein, UA neg    Urobilinogen, UA  0.2 or 1.0 E.U./dL   Nitrite, UA neg    Leukocytes, UA Negative Negative   Appearance     Odor      Assessment & Plan:  1) High-risk pregnancy [id number] at 940w6d with an Estimated Date of Delivery: 09/21/17   2) A2DM, unstable, increase metformin to 500mg  AM/1,000mg  PM  3) Headaches, bp normal, gave printed prevention/relief measures. Reviewed pre-e s/s, reasons to seek care   Meds: No orders of the defined types were placed in this encounter.   Labs/procedures today: nst  Treatment Plan:  Growth u/s @ 36-38wks     2x/wk testing @ 32wks or weekly BPP    Deliver @ 39wks  Reviewed: Preterm labor symptoms and general obstetric precautions including but not limited to vaginal bleeding, contractions, leaking of fluid and fetal movement were reviewed in detail with the patient.  All questions were answered.  Follow-up: Return for As scheduled Thurs for HROB/NST; then Mon/Thurs for NST/HROB x 2wks.  Orders Placed This Encounter  Procedures  .  POCT urinalysis dipstick   Cheral Marker CNM, [facility] 08/09/2017 2:19 PM

## 2017-08-12 ENCOUNTER — Ambulatory Visit (INDEPENDENT_AMBULATORY_CARE_PROVIDER_SITE_OTHER): Payer: Medicaid Other | Admitting: Obstetrics & Gynecology

## 2017-08-12 ENCOUNTER — Encounter: Payer: Self-pay | Admitting: Obstetrics & Gynecology

## 2017-08-12 VITALS — BP 118/74 | HR 96 | Wt 270.0 lb

## 2017-08-12 DIAGNOSIS — Z331 Pregnant state, incidental: Secondary | ICD-10-CM

## 2017-08-12 DIAGNOSIS — Z1389 Encounter for screening for other disorder: Secondary | ICD-10-CM

## 2017-08-12 DIAGNOSIS — Z3A34 34 weeks gestation of pregnancy: Secondary | ICD-10-CM

## 2017-08-12 DIAGNOSIS — O0990 Supervision of high risk pregnancy, unspecified, unspecified trimester: Secondary | ICD-10-CM

## 2017-08-12 DIAGNOSIS — O24415 Gestational diabetes mellitus in pregnancy, controlled by oral hypoglycemic drugs: Secondary | ICD-10-CM

## 2017-08-12 DIAGNOSIS — O24419 Gestational diabetes mellitus in pregnancy, unspecified control: Secondary | ICD-10-CM

## 2017-08-12 LAB — POCT URINALYSIS DIPSTICK
Blood, UA: NEGATIVE
Glucose, UA: NEGATIVE
KETONES UA: NEGATIVE
LEUKOCYTES UA: NEGATIVE
NITRITE UA: NEGATIVE
PROTEIN UA: NEGATIVE

## 2017-08-12 NOTE — Progress Notes (Signed)
   HIGH-RISK PREGNANCY VISIT Patient name: Linda Skinner MRN [id number]  Date of birth: 11/13/1990 Chief Complaint:   High Risk Gestation (NST)  History of Present Illness:   Linda Skinner is a 27 y.o. [id number] female at 2978w2d with an Estimated Date of Delivery: 09/21/17 being seen today for ongoing management of a high-risk pregnancy complicated by class  A2 DM.  Today she reports no complaints. Contractions: Irritability. Vag. Bleeding: None.  Movement: Present. denies leaking of fluid.  Review of Systems:   Pertinent items are noted in HPI Denies abnormal vaginal discharge w/ itching/odor/irritation, headaches, visual changes, shortness of breath, chest pain, abdominal pain, severe nausea/vomiting, or problems with urination or bowel movements unless otherwise stated above. Pertinent History Reviewed:  Reviewed past medical,surgical, social, obstetrical and family history.  Reviewed problem list, medications and allergies. Physical Assessment:   Vitals:   08/12/17 0920  BP: 118/74  Pulse: 96  Weight: 270 lb (122.5 kg)  Body mass index is 46.35 kg/m.           Physical Examination:   General appearance: alert, well appearing, and in no distress  Mental status: alert, oriented to person, place, and time  Skin: warm & dry   Extremities: Edema: Trace    Cardiovascular: normal heart rate noted  Respiratory: normal respiratory effort, no distress  Abdomen: gravid, soft, non-tender  Pelvic: Cervical exam deferred         Fetal Status:     Movement: Present  Reactive NST  Fetal Surveillance Testing today: Reactive NST  No results found for this or any previous visit (from the past 24 hour(s)).  Assessment & Plan:  1) High-risk pregnancy [id number] at 678w2d with an Estimated Date of Delivery: 09/21/17   2) class A2 diabetes, stable, fasting blood sugars are still slightly higher than we would ideally like but she was just increased on her metformin at her last visit Therefore we will  continue her own 500 mg of metformin in the morning and thousand milligrams in the evening, if her fastings remain elevated I would recommend adding glyburide 2.5 mg at bedtime    Meds: No orders of the defined types were placed in this encounter.   Labs/procedures today:   Treatment Plan: Twice weekly fetal surveillance repeat sonogram at 36 weeks for estimated fetal weight projection to 39 weeks planned induction of labor at 39 weeks or as clinically indicated  Reviewed: Preterm labor symptoms and general obstetric precautions including but not limited to vaginal bleeding, contractions, leaking of fluid and fetal movement were reviewed in detail with the patient.  All questions were answered.  Follow-up: Return in about 4 days (around 08/16/2017) for NST, HROB, with Dr Despina HiddenEure.  Orders Placed This Encounter  Procedures  . POCT Urinalysis Dipstick   Lazaro ArmsLuther H Casandra Dallaire 08/13/2017 8:25 PM

## 2017-08-17 ENCOUNTER — Ambulatory Visit (INDEPENDENT_AMBULATORY_CARE_PROVIDER_SITE_OTHER): Payer: Medicaid Other | Admitting: Obstetrics & Gynecology

## 2017-08-17 ENCOUNTER — Encounter: Payer: Self-pay | Admitting: Obstetrics & Gynecology

## 2017-08-17 VITALS — BP 122/74 | HR 88 | Wt 270.0 lb

## 2017-08-17 DIAGNOSIS — Z3A35 35 weeks gestation of pregnancy: Secondary | ICD-10-CM | POA: Diagnosis not present

## 2017-08-17 DIAGNOSIS — Z331 Pregnant state, incidental: Secondary | ICD-10-CM

## 2017-08-17 DIAGNOSIS — O24419 Gestational diabetes mellitus in pregnancy, unspecified control: Secondary | ICD-10-CM

## 2017-08-17 DIAGNOSIS — O0990 Supervision of high risk pregnancy, unspecified, unspecified trimester: Secondary | ICD-10-CM

## 2017-08-17 DIAGNOSIS — O24415 Gestational diabetes mellitus in pregnancy, controlled by oral hypoglycemic drugs: Secondary | ICD-10-CM | POA: Diagnosis not present

## 2017-08-17 DIAGNOSIS — Z1389 Encounter for screening for other disorder: Secondary | ICD-10-CM | POA: Diagnosis not present

## 2017-08-17 LAB — POCT URINALYSIS DIPSTICK
Blood, UA: NEGATIVE
GLUCOSE UA: NEGATIVE
Ketones, UA: NEGATIVE
Leukocytes, UA: NEGATIVE
Nitrite, UA: NEGATIVE
Protein, UA: NEGATIVE

## 2017-08-17 NOTE — Progress Notes (Signed)
   HIGH-RISK PREGNANCY VISIT Patient name: Linda Skinner MRN [id number]  Date of birth: 11/18/1990 Chief Complaint:   Routine Prenatal Visit (NST)  History of Present Illness:   Linda Skinner is a 27 y.o. [id number] female at 5950w0d with an Estimated Date of Delivery: 09/21/17 being seen today for ongoing management of a high-risk pregnancy complicated by class  A2 DM.  Today she reports no complaints. Contractions: Irritability. Vag. Bleeding: None.  Movement: Present. denies leaking of fluid.  Review of Systems:   Pertinent items are noted in HPI Denies abnormal vaginal discharge w/ itching/odor/irritation, headaches, visual changes, shortness of breath, chest pain, abdominal pain, severe nausea/vomiting, or problems with urination or bowel movements unless otherwise stated above. Pertinent History Reviewed:  Reviewed past medical,surgical, social, obstetrical and family history.  Reviewed problem list, medications and allergies. Physical Assessment:   Vitals:   08/17/17 1105  BP: 122/74  Pulse: 88  Weight: 270 lb (122.5 kg)  Body mass index is 46.35 kg/m.           Physical Examination:   General appearance: alert, well appearing, and in no distress  Mental status: alert, oriented to person, place, and time  Skin: warm & dry   Extremities: Edema: Trace    Cardiovascular: normal heart rate noted  Respiratory: normal respiratory effort, no distress  Abdomen: gravid, soft, non-tender  Pelvic: Cervical exam deferred         Fetal Status:     Movement: Present    Fetal Surveillance Testing today: Reactive NST   No results found for this or any previous visit (from the past 24 hour(s)).  Assessment & Plan:  1) High-risk pregnancy [id number] at 1750w0d with an Estimated Date of Delivery: 09/21/17   2) Class A2 DM, stable    Meds: No orders of the defined types were placed in this encounter.   Labs/procedures today: Reactive NST  Treatment Plan:  Sonogram for EFWnext week, twice  weekly surveillance, IOL 39 wks  Reviewed: Term labor symptoms and general obstetric precautions including but not limited to vaginal bleeding, contractions, leaking of fluid and fetal movement were reviewed in detail with the patient.  All questions were answered.  Follow-up: Return in about 3 days (around 08/20/2017) for NST, HROB, 1 week BPP/EFW, BPP/sono.  Orders Placed This Encounter  Procedures  . US OB Follow Up  . POCT Urinalysis Dipstick   Lazaro ArmsLuther H Shann Lewellyn  11/10/2017 8:27 PM

## 2017-08-20 ENCOUNTER — Ambulatory Visit (INDEPENDENT_AMBULATORY_CARE_PROVIDER_SITE_OTHER): Payer: Medicaid Other | Admitting: Women's Health

## 2017-08-20 ENCOUNTER — Encounter: Payer: Self-pay | Admitting: Women's Health

## 2017-08-20 VITALS — BP 110/80 | HR 95 | Wt 270.0 lb

## 2017-08-20 DIAGNOSIS — O24415 Gestational diabetes mellitus in pregnancy, controlled by oral hypoglycemic drugs: Secondary | ICD-10-CM | POA: Diagnosis not present

## 2017-08-20 DIAGNOSIS — O0993 Supervision of high risk pregnancy, unspecified, third trimester: Secondary | ICD-10-CM

## 2017-08-20 DIAGNOSIS — O24419 Gestational diabetes mellitus in pregnancy, unspecified control: Secondary | ICD-10-CM

## 2017-08-20 DIAGNOSIS — Z331 Pregnant state, incidental: Secondary | ICD-10-CM | POA: Diagnosis not present

## 2017-08-20 DIAGNOSIS — Z3A35 35 weeks gestation of pregnancy: Secondary | ICD-10-CM

## 2017-08-20 DIAGNOSIS — Z1389 Encounter for screening for other disorder: Secondary | ICD-10-CM

## 2017-08-20 LAB — POCT URINALYSIS DIPSTICK
Glucose, UA: NEGATIVE
KETONES UA: NEGATIVE
Leukocytes, UA: NEGATIVE
NITRITE UA: NEGATIVE
PROTEIN UA: NEGATIVE
RBC UA: NEGATIVE

## 2017-08-20 NOTE — Patient Instructions (Signed)
Drusilla KannerStacey Sidell, I greatly value your feedback.  If you receive a survey following your visit with us today, we appreciate you taking the time to fill it out.  Thanks, Joellyn HaffKim Kunta Hilleary, CNM, WHNP-BC   Call the office [phone]) or [facility] to [facility] if:  You begin to have strong, frequent contractions  Your water breaks.  Sometimes it is a big gush of fluid, sometimes it is just a trickle that keeps getting your panties wet or running down your legs  You have vaginal bleeding.  It is normal to have a small amount of spotting if your cervix was checked.   You don't feel your baby moving like normal.  If you don't, get you something to eat and drink and lay down and focus on feeling your baby move.  You should feel at least 10 movements in 2 hours.  If you don't, you should call the office or go to [facility].     Preterm Labor and Birth Information The normal length of a pregnancy is 39-41 weeks. Preterm labor is when labor starts before 37 completed weeks of pregnancy. What are the risk factors for preterm labor? Preterm labor is more likely to occur in women who:  Have certain infections during pregnancy such as a bladder infection, sexually transmitted infection, or infection inside the uterus (chorioamnionitis).  Have a shorter-than-normal cervix.  Have gone into preterm labor before.  Have had surgery on their cervix.  Are younger than age 28 or older than age 28.  Are African American.  Are pregnant with twins or multiple babies (multiple gestation).  Take street drugs or smoke while pregnant.  Do not gain enough weight while pregnant.  Became pregnant shortly after having been pregnant.  What are the symptoms of preterm labor? Symptoms of preterm labor include:  Cramps similar to those that can happen during a menstrual period. The cramps may happen with diarrhea.  Pain in the abdomen or lower back.  Regular uterine contractions that may feel like tightening of  the abdomen.  A feeling of increased pressure in the pelvis.  Increased watery or bloody mucus discharge from the vagina.  Water breaking (ruptured amniotic sac).  Why is it important to recognize signs of preterm labor? It is important to recognize signs of preterm labor because babies who are born prematurely may not be fully developed. This can put them at an increased risk for:  Long-term (chronic) heart and lung problems.  Difficulty immediately after birth with regulating body systems, including blood sugar, body temperature, heart rate, and breathing rate.  Bleeding in the brain.  Cerebral palsy.  Learning difficulties.  Death.  These risks are highest for babies who are born before 34 weeks of pregnancy. How is preterm labor treated? Treatment depends on the length of your pregnancy, your condition, and the health of your baby. It may involve:  Having a stitch (suture) placed in your cervix to prevent your cervix from opening too early (cerclage).  Taking or being given medicines, such as: ? Hormone medicines. These may be given early in pregnancy to help support the pregnancy. ? Medicine to stop contractions. ? Medicines to help mature the baby's lungs. These may be prescribed if the risk of delivery is high. ? Medicines to prevent your baby from developing cerebral palsy.  If the labor happens before 34 weeks of pregnancy, you may need to stay in the hospital. What should I do if I think I am in preterm labor? If you  think that you are going into preterm labor, call your health care provider right away. How can I prevent preterm labor in future pregnancies? To increase your chance of having a full-term pregnancy:  Do not use any tobacco products, such as cigarettes, chewing tobacco, and e-cigarettes. If you need help quitting, ask your health care provider.  Do not use street drugs or medicines that have not been prescribed to you during your pregnancy.  Talk  with your health care provider before taking any herbal supplements, even if you have been taking them regularly.  Make sure you gain a healthy amount of weight during your pregnancy.  Watch for infection. If you think that you might have an infection, get it checked right away.  Make sure to tell your health care provider if you have gone into preterm labor before.  This information is not intended to replace advice given to you by your health care provider. Make sure you discuss any questions you have with your health care provider. Document Released: 08/08/2003 Document Revised: 10/29/2015 Document Reviewed: 10/09/2015 Elsevier Interactive Patient Education  2018 [employer].

## 2017-08-20 NOTE — Progress Notes (Signed)
   HIGH-RISK PREGNANCY VISIT Patient name: Linda KannerStacey Skinner MRN [id number]  Date of birth: 03/02/1991 Chief Complaint:   High Risk Gestation (NST/ room# 8)  History of Present Illness:   Linda Skinner is a 27 y.o. [id number] female at 4526w3d with an Estimated Date of Delivery: 09/21/17 being seen today for ongoing management of a high-risk pregnancy complicated by A2DM- currently on metformin 500AM/1,000PM.  Today she reports FBS 90,85,89, 2hr pp: 89-119, ALL GOOD!. Contractions: Irritability.  .  Movement: Present. denies leaking of fluid.  Review of Systems:   Pertinent items are noted in HPI Denies abnormal vaginal discharge w/ itching/odor/irritation, headaches, visual changes, shortness of breath, chest pain, abdominal pain, severe nausea/vomiting, or problems with urination or bowel movements unless otherwise stated above. Pertinent History Reviewed:  Reviewed past medical,surgical, social, obstetrical and family history.  Reviewed problem list, medications and allergies. Physical Assessment:   Vitals:   08/20/17 1104  BP: 110/80  Pulse: 95  Weight: 270 lb (122.5 kg)  Body mass index is 46.35 kg/m.           Physical Examination:   General appearance: alert, well appearing, and in no distress  Mental status: alert, oriented to person, place, and time  Skin: warm & dry   Extremities: Edema: None    Cardiovascular: normal heart rate noted  Respiratory: normal respiratory effort, no distress  Abdomen: gravid, soft, non-tender  Pelvic: Cervical exam deferred         Fetal Status: Fetal Heart Rate (bpm): 135 Fundal Height: 37 cm Movement: Present    Fetal Surveillance Testing today: NST: FHR baseline 135 bpm, Variability: moderate, Accelerations:present, Decelerations:  Absent= Cat 1/Reactive Toco: none     Results for orders placed or performed in visit on 08/20/17 (from the past 24 hour(s))  POCT urinalysis dipstick   Collection Time: 08/20/17 11:11 AM  Result Value Ref Range   Color, UA     Clarity, UA     Glucose, UA neg    Bilirubin, UA     Ketones, UA neg    Spec Grav, UA  1.010 - 1.025   Blood, UA neg    pH, UA  5.0 - 8.0   Protein, UA neg    Urobilinogen, UA  0.2 or 1.0 E.U./dL   Nitrite, UA neg    Leukocytes, UA Negative Negative   Appearance     Odor      Assessment & Plan:  1) High-risk pregnancy [id number] at 1326w3d with an Estimated Date of Delivery: 09/21/17   2) A2DM, stable, continue metformin 500AM/1,000PM  Meds: No orders of the defined types were placed in this encounter.   Labs/procedures today: nst  Treatment Plan:  Growth u/s @ 36-38wks     2x/wk testing @ 32wks or weekly BPP    Deliver @ 39wks  Reviewed: Preterm labor symptoms and general obstetric precautions including but not limited to vaginal bleeding, contractions, leaking of fluid and fetal movement were reviewed in detail with the patient.  All questions were answered.  Follow-up: Return for As scheduled Tues for hrob, bpp/efw u/s.  Orders Placed This Encounter  Procedures  . US OB Follow Up  . US FETAL BPP WO NON STRESS  . POCT urinalysis dipstick   Cheral MarkerKimberly R Moris Ratchford CNM, [facility] 08/20/2017 11:50 AM

## 2017-08-24 ENCOUNTER — Encounter: Payer: Self-pay | Admitting: Obstetrics & Gynecology

## 2017-08-24 ENCOUNTER — Ambulatory Visit (INDEPENDENT_AMBULATORY_CARE_PROVIDER_SITE_OTHER): Payer: Medicaid Other | Admitting: Obstetrics & Gynecology

## 2017-08-24 ENCOUNTER — Ambulatory Visit (INDEPENDENT_AMBULATORY_CARE_PROVIDER_SITE_OTHER): Payer: Medicaid Other

## 2017-08-24 VITALS — BP 122/60 | HR 108 | Wt 271.5 lb

## 2017-08-24 DIAGNOSIS — Z3A36 36 weeks gestation of pregnancy: Secondary | ICD-10-CM | POA: Diagnosis not present

## 2017-08-24 DIAGNOSIS — Z331 Pregnant state, incidental: Secondary | ICD-10-CM

## 2017-08-24 DIAGNOSIS — O0993 Supervision of high risk pregnancy, unspecified, third trimester: Secondary | ICD-10-CM

## 2017-08-24 DIAGNOSIS — Z1389 Encounter for screening for other disorder: Secondary | ICD-10-CM | POA: Diagnosis not present

## 2017-08-24 DIAGNOSIS — O24419 Gestational diabetes mellitus in pregnancy, unspecified control: Secondary | ICD-10-CM

## 2017-08-24 DIAGNOSIS — O24415 Gestational diabetes mellitus in pregnancy, controlled by oral hypoglycemic drugs: Secondary | ICD-10-CM | POA: Diagnosis not present

## 2017-08-24 LAB — POCT URINALYSIS DIPSTICK
Blood, UA: NEGATIVE
Glucose, UA: NEGATIVE
Leukocytes, UA: NEGATIVE
Nitrite, UA: NEGATIVE

## 2017-08-24 MED ORDER — METFORMIN HCL 500 MG PO TABS: ORAL_TABLET | ORAL | 1 refills | Status: DC

## 2017-08-24 NOTE — Progress Notes (Signed)
US 36 wks,cephalic,posterior pl gr 1,normal ovaries bilat,afi 136 cm,fhr 144 bpm,LVEICF n/c,BPP 8/8,EFW 3203 g 85%

## 2017-08-24 NOTE — Progress Notes (Signed)
   HIGH-RISK PREGNANCY VISIT Patient name: Linda Skinner MRN [id number]  Date of birth: 02/07/1991 Chief Complaint:   High Risk Gestation (US today; + cramps)  History of Present Illness:   Linda KannerStacey Daman is a 27 y.o. [id number] female at 9140w0d with an Estimated Date of Delivery: 09/21/17 being seen today for ongoing management of a high-risk pregnancy complicated by class  A2 DM.  Today she reports cramps. Contractions: Irregular. Vag. Bleeding: None.  Movement: Present. denies leaking of fluid.  Review of Systems:   Pertinent items are noted in HPI Denies abnormal vaginal discharge w/ itching/odor/irritation, headaches, visual changes, shortness of breath, chest pain, abdominal pain, severe nausea/vomiting, or problems with urination or bowel movements unless otherwise stated above. Pertinent History Reviewed:  Reviewed past medical,surgical, social, obstetrical and family history.  Reviewed problem list, medications and allergies. Physical Assessment:   Vitals:   08/24/17 1144  BP: 122/60  Pulse: (!) 108  Weight: 271 lb 8 oz (123.2 kg)  Body mass index is 46.6 kg/m.           Physical Examination:   General appearance: alert, well appearing, and in no distress  Mental status: alert, oriented to person, place, and time  Skin: warm & dry   Extremities: Edema: Trace    Cardiovascular: normal heart rate noted  Respiratory: normal respiratory effort, no distress  Abdomen: gravid, soft, non-tender  Pelvic: Cervical exam deferred         Fetal Status:     Movement: Present    Fetal Surveillance Testing today: BPP 8/8   Results for orders placed or performed in visit on 08/24/17 (from the past 24 hour(s))  POCT urinalysis dipstick   Collection Time: 08/24/17 11:44 AM  Result Value Ref Range   Color, UA     Clarity, UA     Glucose, UA neg    Bilirubin, UA     Ketones, UA 1+    Spec Grav, UA  1.010 - 1.025   Blood, UA neg    pH, UA  5.0 - 8.0   Protein, UA trace    Urobilinogen,  UA  0.2 or 1.0 E.U./dL   Nitrite, UA neg    Leukocytes, UA Negative Negative   Appearance     Odor      Assessment & Plan:  1) High-risk pregnancy [id number] at 940w0d with an Estimated Date of Delivery: 09/21/17   2) Class A2 DM, stable, contineu 500 in am 1000 at supper    Meds:  Meds ordered this encounter  Medications  . metFORMIN (GLUCOPHAGE) 500 MG tablet    Sig: 1 tablet in the morning and 2 tablets at supper    Dispense:  90 tablet    Refill:  1    Labs/procedures today: sonogram BPP 8/8  Treatment Plan:  IOL 39 weeks, continue NST twice weekly  Reviewed: Term labor symptoms and general obstetric precautions including but not limited to vaginal bleeding, contractions, leaking of fluid and fetal movement were reviewed in detail with the patient.  All questions were answered.  Follow-up: Return in about 3 days (around 08/27/2017) for NST, HROB.  Orders Placed This Encounter  Procedures  . POCT urinalysis dipstick   Lazaro ArmsLuther H Eure  08/24/2017 11:55 AM

## 2017-08-27 ENCOUNTER — Ambulatory Visit (INDEPENDENT_AMBULATORY_CARE_PROVIDER_SITE_OTHER): Payer: Medicaid Other | Admitting: Obstetrics and Gynecology

## 2017-08-27 ENCOUNTER — Encounter: Payer: Self-pay | Admitting: Obstetrics and Gynecology

## 2017-08-27 VITALS — BP 118/70 | HR 94 | Wt 275.0 lb

## 2017-08-27 DIAGNOSIS — O24415 Gestational diabetes mellitus in pregnancy, controlled by oral hypoglycemic drugs: Secondary | ICD-10-CM

## 2017-08-27 DIAGNOSIS — Z331 Pregnant state, incidental: Secondary | ICD-10-CM | POA: Diagnosis not present

## 2017-08-27 DIAGNOSIS — O24419 Gestational diabetes mellitus in pregnancy, unspecified control: Secondary | ICD-10-CM

## 2017-08-27 DIAGNOSIS — Z3A36 36 weeks gestation of pregnancy: Secondary | ICD-10-CM | POA: Diagnosis not present

## 2017-08-27 DIAGNOSIS — Z283 Underimmunization status: Secondary | ICD-10-CM

## 2017-08-27 DIAGNOSIS — Z1389 Encounter for screening for other disorder: Secondary | ICD-10-CM

## 2017-08-27 DIAGNOSIS — O09899 Supervision of other high risk pregnancies, unspecified trimester: Secondary | ICD-10-CM

## 2017-08-27 DIAGNOSIS — O0990 Supervision of high risk pregnancy, unspecified, unspecified trimester: Secondary | ICD-10-CM

## 2017-08-27 DIAGNOSIS — Z2839 Other underimmunization status: Secondary | ICD-10-CM

## 2017-08-27 LAB — POCT URINALYSIS DIPSTICK
Blood, UA: NEGATIVE
Glucose, UA: NEGATIVE
KETONES UA: NEGATIVE
Leukocytes, UA: NEGATIVE
Nitrite, UA: NEGATIVE
Protein, UA: NEGATIVE

## 2017-08-27 NOTE — Progress Notes (Addendum)
 HIGH-RISK PREGNANCY VISIT Patient name: Linda Skinner MRN [id number]  Date of birth: 04/20/1991 Chief Complaint:   High Risk Gestation (NST)  History of Present Illness:   Brayden Piascik is a 27 y.o. G6P3023 female at [redacted]w[redacted]d with an Estimated Date of Delivery: 09/21/17 being seen today for ongoing management of a high-risk pregnancy complicated by class  A2 DM.  She notes that her blood sugar fastings under 95 and post prandials under 119.  Today she reports no complaints. Contractions: Irregular. Vag. Bleeding: None.  Movement: Present. denies leaking of fluid.  Review of Systems:   Pertinent items are noted in HPI Denies abnormal vaginal discharge w/ itching/odor/irritation, headaches, visual changes, shortness of breath, chest pain, abdominal pain, severe nausea/vomiting, or problems with urination or bowel movements unless otherwise stated above. Pertinent History Reviewed:  Reviewed past medical,surgical, social, obstetrical and family history.  Reviewed problem list, medications and allergies. Physical Assessment:   Vitals:   08/27/17 0949  BP: 118/70  Pulse: 94  Weight: 275 lb (124.7 kg)  Body mass index is 47.2 kg/m.           Physical Examination:   General appearance: alert, well appearing, and in no distress  Mental status: alert, oriented to person, place, and time  Skin: warm & dry   Extremities: Edema: Trace    Cardiovascular: normal heart rate noted  Respiratory: normal respiratory effort, no distress  Abdomen: gravid, soft, non-tender  Pelvic: Cervical exam performed -3, 1 cm, long Fetal Status:     Movement: Present   FHR: 140 with accels to 170. FH: 38 cm  Fetal Surveillance Testing today: NST : reactive  Results for orders placed or performed in visit on 08/27/17 (from the past 24 hour(s))  POCT urinalysis dipstick   Collection Time: 08/27/17  9:59 AM  Result Value Ref Range   Color, UA     Clarity, UA     Glucose, UA neg    Bilirubin, UA     Ketones, UA  neg    Spec Grav, UA  1.010 - 1.025   Blood, UA neg    pH, UA  5.0 - 8.0   Protein, UA neg    Urobilinogen, UA  0.2 or 1.0 E.U./dL   Nitrite, UA neg    Leukocytes, UA Negative Negative   Appearance     Odor      Assessment & Plan:  1) High-risk pregnancy G6P3023 at [redacted]w[redacted]d with an Estimated Date of Delivery: 09/21/17   2) Class A2 DM, stable, blood sugar fastings under 95 and post prandials under 119    Meds: No orders of the defined types were placed in this encounter.   Labs/procedures today: GBS and GC/Chl completed today  Treatment Plan:  Follow up in 4 days for NST   Reviewed: Preterm labor symptoms and general obstetric precautions including but not limited to vaginal bleeding, contractions, leaking of fluid and fetal movement were reviewed in detail with the patient.  All questions were answered.  Follow-up: Return in about 4 days (around 08/31/2017) for NST.  Orders Placed This Encounter  Procedures  . GC/Chlamydia Probe Amp  . Culture, beta strep (group b only)  . POCT urinalysis dipstick   By signing my name below, I, Soijett Blue, attest that this documentation has been prepared under the direction and in the presence of Ferguson, John V, MD. Electronically Signed: Soijett Blue, Scribe. 08/27/17. 11:15 AM  I personally performed the services described in this documentation, which was   SCRIBED in my presence. The recorded information has been reviewed and considered accurate. It has been edited as necessary during review. John V Ferguson, MD 

## 2017-08-27 NOTE — Progress Notes (Signed)
HIGH-RISK PREGNANCY VISIT Patient name: Linda Skinner MRN [id number]  Date of birth: Jan 20, 1991 Chief Complaint:   High Risk Gestation (NST)  History of Present Illness:   Linda Skinner is a 27 y.o. [id number] female at [redacted]w[redacted]d with an Estimated Date of Delivery: 09/21/17 being seen today for ongoing management of a high-risk pregnancy complicated by class  A2 DM.  She notes that her blood sugar fastings under 95 and post prandials under 119.  Today she reports no complaints. Contractions: Irregular. Vag. Bleeding: None.  Movement: Present. denies leaking of fluid.  Review of Systems:   Pertinent items are noted in HPI Denies abnormal vaginal discharge w/ itching/odor/irritation, headaches, visual changes, shortness of breath, chest pain, abdominal pain, severe nausea/vomiting, or problems with urination or bowel movements unless otherwise stated above. Pertinent History Reviewed:  Reviewed past medical,surgical, social, obstetrical and family history.  Reviewed problem list, medications and allergies. Physical Assessment:   Vitals:   08/27/17 0949  BP: 118/70  Pulse: 94  Weight: 275 lb (124.7 kg)  Body mass index is 47.2 kg/m.           Physical Examination:   General appearance: alert, well appearing, and in no distress  Mental status: alert, oriented to person, place, and time  Skin: warm & dry   Extremities: Edema: Trace    Cardiovascular: normal heart rate noted  Respiratory: normal respiratory effort, no distress  Abdomen: gravid, soft, non-tender  Pelvic: Cervical exam performed -3, 1 cm, long Fetal Status:     Movement: Present   FHR: 140 with accels to 170. FH: 38 cm  Fetal Surveillance Testing today: NST : reactive  Results for orders placed or performed in visit on 08/27/17 (from the past 24 hour(s))  POCT urinalysis dipstick   Collection Time: 08/27/17  9:59 AM  Result Value Ref Range   Color, UA     Clarity, UA     Glucose, UA neg    Bilirubin, UA     Ketones, UA  neg    Spec Grav, UA  1.010 - 1.025   Blood, UA neg    pH, UA  5.0 - 8.0   Protein, UA neg    Urobilinogen, UA  0.2 or 1.0 E.U./dL   Nitrite, UA neg    Leukocytes, UA Negative Negative   Appearance     Odor      Assessment & Plan:  1) High-risk pregnancy [id number] at [redacted]w[redacted]d with an Estimated Date of Delivery: 09/21/17   2) Class A2 DM, stable, blood sugar fastings under 95 and post prandials under 119    Meds: No orders of the defined types were placed in this encounter.   Labs/procedures today: GBS and GC/Chl completed today  Treatment Plan:  Follow up in 4 days for NST   Reviewed: Preterm labor symptoms and general obstetric precautions including but not limited to vaginal bleeding, contractions, leaking of fluid and fetal movement were reviewed in detail with the patient.  All questions were answered.  Follow-up: Return in about 4 days (around 08/31/2017) for NST.  Orders Placed This Encounter  Procedures  . GC/Chlamydia Probe Amp  . Culture, beta strep (group b only)  . POCT urinalysis dipstick   By signing my name below, I, Soijett Blue, attest that this documentation has been prepared under the direction and in the presence of Tilda Burrow, MD. Electronically Signed: Soijett Blue, Scribe. 08/27/17. 11:15 AM  I personally performed the services described in this documentation, which was  SCRIBED in my presence. The recorded information has been reviewed and considered accurate. It has been edited as necessary during review. Tilda BurrowJohn V Westly Hinnant, MD

## 2017-08-29 LAB — GC/CHLAMYDIA PROBE AMP
CHLAMYDIA, DNA PROBE: NEGATIVE
Neisseria gonorrhoeae by PCR: NEGATIVE

## 2017-08-30 LAB — CULTURE, BETA STREP (GROUP B ONLY): Strep Gp B Culture: POSITIVE — AB

## 2017-08-31 ENCOUNTER — Ambulatory Visit (INDEPENDENT_AMBULATORY_CARE_PROVIDER_SITE_OTHER): Payer: Medicaid Other | Admitting: Obstetrics & Gynecology

## 2017-08-31 ENCOUNTER — Encounter: Payer: Self-pay | Admitting: Obstetrics & Gynecology

## 2017-08-31 ENCOUNTER — Other Ambulatory Visit: Payer: Self-pay

## 2017-08-31 VITALS — BP 118/64 | HR 108 | Wt 276.0 lb

## 2017-08-31 DIAGNOSIS — Z331 Pregnant state, incidental: Secondary | ICD-10-CM | POA: Diagnosis not present

## 2017-08-31 DIAGNOSIS — O24419 Gestational diabetes mellitus in pregnancy, unspecified control: Secondary | ICD-10-CM

## 2017-08-31 DIAGNOSIS — Z1389 Encounter for screening for other disorder: Secondary | ICD-10-CM | POA: Diagnosis not present

## 2017-08-31 DIAGNOSIS — O24415 Gestational diabetes mellitus in pregnancy, controlled by oral hypoglycemic drugs: Secondary | ICD-10-CM | POA: Diagnosis not present

## 2017-08-31 DIAGNOSIS — Z3A37 37 weeks gestation of pregnancy: Secondary | ICD-10-CM | POA: Diagnosis not present

## 2017-08-31 DIAGNOSIS — O0993 Supervision of high risk pregnancy, unspecified, third trimester: Secondary | ICD-10-CM

## 2017-08-31 LAB — POCT URINALYSIS DIPSTICK
GLUCOSE UA: NEGATIVE
Ketones, UA: NEGATIVE
Leukocytes, UA: NEGATIVE
Nitrite, UA: NEGATIVE
Protein, UA: NEGATIVE
RBC UA: NEGATIVE

## 2017-08-31 MED ORDER — OMEPRAZOLE 20 MG PO CPDR: 20.0000 mg | DELAYED_RELEASE_CAPSULE | Freq: Every day | ORAL | 6 refills | Status: DC

## 2017-08-31 NOTE — Progress Notes (Signed)
   HIGH-RISK PREGNANCY VISIT Patient name: Linda KannerStacey Blystone MRN [id number]  Date of birth: 08/02/1990 Chief Complaint:   High Risk Gestation (NSt)  History of Present Illness:   Linda KannerStacey Okun is a 27 y.o. [id number] female at 3069w0d with an Estimated Date of Delivery: 09/21/17 being seen today for ongoing management of a high-risk pregnancy complicated by class  A2 DM.  Today she reports no complaints. Contractions: Irregular. Vag. Bleeding: None.  Movement: Present. denies leaking of fluid.  Review of Systems:   Pertinent items are noted in HPI Denies abnormal vaginal discharge w/ itching/odor/irritation, headaches, visual changes, shortness of breath, chest pain, abdominal pain, severe nausea/vomiting, or problems with urination or bowel movements unless otherwise stated above. Pertinent History Reviewed:  Reviewed past medical,surgical, social, obstetrical and family history.  Reviewed problem list, medications and allergies. Physical Assessment:   Vitals:   08/31/17 1105  BP: 118/64  Pulse: (!) 108  Weight: 276 lb (125.2 kg)  Body mass index is 47.38 kg/m.           Physical Examination:   General appearance: alert, well appearing, and in no distress  Mental status: alert, oriented to person, place, and time  Skin: warm & dry   Extremities: Edema: None    Cardiovascular: normal heart rate noted  Respiratory: normal respiratory effort, no distress  Abdomen: gravid, soft, non-tender  Pelvic: Cervical exam deferred         Fetal Status:     Movement: Present    Fetal Surveillance Testing today: reactive NST   Results for orders placed or performed in visit on 08/31/17 (from the past 24 hour(s))  POCT urinalysis dipstick   Collection Time: 08/31/17 11:06 AM  Result Value Ref Range   Color, UA     Clarity, UA     Glucose, UA neg    Bilirubin, UA     Ketones, UA neg    Spec Grav, UA  1.010 - 1.025   Blood, UA neg    pH, UA  5.0 - 8.0   Protein, UA neg    Urobilinogen, UA  0.2  or 1.0 E.U./dL   Nitrite, UA neg    Leukocytes, UA Negative Negative   Appearance     Odor      Assessment & Plan:  1) High-risk pregnancy [id number] at 8269w0d with an Estimated Date of Delivery: 09/21/17   2) Class A2 DM, stable, Metformin 500 in am 1000 in PM, EFW 3900 grams at 39 weeks    Meds:  Meds ordered this encounter  Medications  . omeprazole (PRILOSEC) 20 MG capsule    Sig: Take 1 capsule (20 mg total) by mouth daily. 1 tablet a day    Dispense:  30 capsule    Refill:  6    Labs/procedures today: Reactive NST  Treatment Plan:  Twice weekly NST, IOL 39 weeks or as clinically indicated  Reviewed: Term labor symptoms and general obstetric precautions including but not limited to vaginal bleeding, contractions, leaking of fluid and fetal movement were reviewed in detail with the patient.  All questions were answered.  Follow-up: Return in about 3 days (around 09/03/2017) for NST, HROB.  Orders Placed This Encounter  Procedures  . POCT urinalysis dipstick   Lazaro ArmsLuther H Nishika Parkhurst  08/31/2017 11:51 AM

## 2017-09-03 ENCOUNTER — Encounter: Payer: Self-pay | Admitting: Obstetrics & Gynecology

## 2017-09-03 ENCOUNTER — Ambulatory Visit (INDEPENDENT_AMBULATORY_CARE_PROVIDER_SITE_OTHER): Payer: Medicaid Other | Admitting: Obstetrics & Gynecology

## 2017-09-03 VITALS — BP 120/70 | HR 96 | Wt 279.0 lb

## 2017-09-03 DIAGNOSIS — O24419 Gestational diabetes mellitus in pregnancy, unspecified control: Secondary | ICD-10-CM | POA: Diagnosis not present

## 2017-09-03 DIAGNOSIS — Z3A37 37 weeks gestation of pregnancy: Secondary | ICD-10-CM

## 2017-09-03 DIAGNOSIS — Z331 Pregnant state, incidental: Secondary | ICD-10-CM

## 2017-09-03 DIAGNOSIS — Z1389 Encounter for screening for other disorder: Secondary | ICD-10-CM

## 2017-09-03 DIAGNOSIS — O0993 Supervision of high risk pregnancy, unspecified, third trimester: Secondary | ICD-10-CM

## 2017-09-03 LAB — POCT URINALYSIS DIPSTICK
Blood, UA: NEGATIVE
Glucose, UA: NEGATIVE
Leukocytes, UA: NEGATIVE
Nitrite, UA: NEGATIVE

## 2017-09-03 NOTE — Progress Notes (Signed)
   HIGH-RISK PREGNANCY VISIT Patient name: Linda KannerStacey Skinner MRN [id number]  Date of birth: 08/14/1990 Chief Complaint:   high risk ob (NST/ tingling hands)  History of Present Illness:   Linda Skinner is a 27 y.o. [id number] female at 6258w3d with an Estimated Date of Delivery: 09/21/17 being seen today for ongoing management of a high-risk pregnancy complicated by class  A2 DM.  Today she reports carpal tunnel symptoms. Contractions: Irregular.  .  Movement: Present. denies leaking of fluid.  Review of Systems:   Pertinent items are noted in HPI Denies abnormal vaginal discharge w/ itching/odor/irritation, headaches, visual changes, shortness of breath, chest pain, abdominal pain, severe nausea/vomiting, or problems with urination or bowel movements unless otherwise stated above. Pertinent History Reviewed:  Reviewed past medical,surgical, social, obstetrical and family history.  Reviewed problem list, medications and allergies. Physical Assessment:   Vitals:   09/03/17 0900  BP: 120/70  Pulse: 96  Weight: 279 lb (126.6 kg)  Body mass index is 47.89 kg/m.           Physical Examination:   General appearance: alert, well appearing, and in no distress  Mental status: alert, oriented to person, place, and time  Skin: warm & dry   Extremities: Edema: None    Cardiovascular: normal heart rate noted  Respiratory: normal respiratory effort, no distress  Abdomen: gravid, soft, non-tender  Pelvic: Cervical exam deferred         Fetal Status:     Movement: Present    Fetal Surveillance Testing today: Reactive NST   Results for orders placed or performed in visit on 09/03/17 (from the past 24 hour(s))  POCT urinalysis dipstick   Collection Time: 09/03/17  9:06 AM  Result Value Ref Range   Color, UA     Clarity, UA     Glucose, UA neg    Bilirubin, UA     Ketones, UA small    Spec Grav, UA  1.010 - 1.025   Blood, UA neg    pH, UA  5.0 - 8.0   Protein, UA trace    Urobilinogen, UA  0.2 or  1.0 E.U./dL   Nitrite, UA neg    Leukocytes, UA Negative Negative   Appearance     Odor      Assessment & Plan:  1) High-risk pregnancy [id number] at 7558w3d with an Estimated Date of Delivery: 09/21/17   2) Class A2 DM, stable, wish fastings were just a smidge lower but 2 hour pp are great, will not add meds at this point,  3) carpal tunnel syndrome of pregnancy, unstable, braces ordered  Meds: No orders of the defined types were placed in this encounter.   Labs/procedures today: reractive nst  Treatment Plan:  Twice weekly surveillance deliver at 39 weeks or as clinically indicated  Reviewed: Term labor symptoms and general obstetric precautions including but not limited to vaginal bleeding, contractions, leaking of fluid and fetal movement were reviewed in detail with the patient.  All questions were answered.  Follow-up: Return in about 4 days (around 09/07/2017) for NST, HROB.  Orders Placed This Encounter  Procedures  . POCT urinalysis dipstick   Lazaro ArmsLuther H Eure  09/03/2017 10:06 AM

## 2017-09-07 ENCOUNTER — Encounter: Payer: Self-pay | Admitting: Advanced Practice Midwife

## 2017-09-07 ENCOUNTER — Ambulatory Visit (INDEPENDENT_AMBULATORY_CARE_PROVIDER_SITE_OTHER): Payer: Medicaid Other | Admitting: Advanced Practice Midwife

## 2017-09-07 VITALS — BP 120/70 | HR 114 | Wt 277.0 lb

## 2017-09-07 DIAGNOSIS — Z1389 Encounter for screening for other disorder: Secondary | ICD-10-CM | POA: Diagnosis not present

## 2017-09-07 DIAGNOSIS — M545 Low back pain: Secondary | ICD-10-CM

## 2017-09-07 DIAGNOSIS — O9989 Other specified diseases and conditions complicating pregnancy, childbirth and the puerperium: Secondary | ICD-10-CM | POA: Diagnosis not present

## 2017-09-07 DIAGNOSIS — O24419 Gestational diabetes mellitus in pregnancy, unspecified control: Secondary | ICD-10-CM

## 2017-09-07 DIAGNOSIS — O0990 Supervision of high risk pregnancy, unspecified, unspecified trimester: Secondary | ICD-10-CM

## 2017-09-07 DIAGNOSIS — O24415 Gestational diabetes mellitus in pregnancy, controlled by oral hypoglycemic drugs: Secondary | ICD-10-CM

## 2017-09-07 DIAGNOSIS — Z331 Pregnant state, incidental: Secondary | ICD-10-CM

## 2017-09-07 DIAGNOSIS — Z3A38 38 weeks gestation of pregnancy: Secondary | ICD-10-CM | POA: Diagnosis not present

## 2017-09-07 LAB — POCT URINALYSIS DIPSTICK
Blood, UA: NEGATIVE
Glucose, UA: NEGATIVE
Ketones, UA: NEGATIVE
LEUKOCYTES UA: NEGATIVE
NITRITE UA: NEGATIVE

## 2017-09-07 NOTE — Progress Notes (Signed)
HIGH-RISK PREGNANCY VISIT Patient name: Linda Skinner MRN [id number]  Date of birth: 12/13/1990 Chief Complaint:   High Risk Gestation (NST)  History of Present Illness:   Linda Skinner is a 27 y.o. [id number] female at 1151w0d with an Estimated Date of Delivery: 09/21/17 being seen today for ongoing management of a high-risk pregnancy complicated by gestational DM, class  A2 DM.  Today she reports some LBP last night. Contractions: Irregular. Vag. Bleeding: None.  Movement: Present. denies leaking of fluid.  Review of Systems:   Pertinent items are noted in HPI Denies abnormal vaginal discharge w/ itching/odor/irritation, headaches, visual changes, shortness of breath, chest pain, abdominal pain, severe nausea/vomiting, or problems with urination or bowel movements unless otherwise stated above.    Pertinent History Reviewed:  Medical & Surgical Hx:   Past Medical History:  Diagnosis Date  . Medical history non-contributory    Past Surgical History:  Procedure Laterality Date  . MOUTH SURGERY    . WISDOM TOOTH EXTRACTION     Family History  Problem Relation Age of Onset  . Lupus Mother   . Cancer Paternal Grandfather   . Atrial fibrillation Maternal Grandmother     Current Outpatient Medications:  .  ferrous sulfate 325 (65 FE) MG tablet, Take 1 tablet (325 mg total) by mouth 2 (two) times daily with a meal., Disp: 60 tablet, Rfl: 3 .  imiquimod (ALDARA) 5 % cream, Apply topically 3 (three) times a week., Disp: 12 each, Rfl: 3 .  metFORMIN (GLUCOPHAGE) 500 MG tablet, 1 tablet in the morning and 2 tablets at supper, Disp: 90 tablet, Rfl: 1 .  omeprazole (PRILOSEC) 20 MG capsule, Take 1 capsule (20 mg total) by mouth daily. 1 tablet a day, Disp: 30 capsule, Rfl: 6 .  prenatal vitamin w/FE, FA (PRENATAL 1 + 1) 27-1 MG TABS tablet, Take 1 tablet by mouth daily at 12 noon., Disp: 30 each, Rfl: 12 Social History: Reviewed -  reports that she has quit smoking. Her smoking use included cigars.  She has a 0.04 pack-year smoking history. She has never used smokeless tobacco.   Physical Assessment:   Vitals:   09/07/17 0935  BP: 120/70  Pulse: (!) 114  Weight: 277 lb (125.6 kg)  Body mass index is 47.55 kg/m.           Physical Examination:   General appearance: alert, well appearing, and in no distress  Mental status: alert, oriented to person, place, and time  Skin: warm & dry   Extremities: Edema: None    Cardiovascular: nor mal heart rate noted  Respiratory: normal respiratory effort, no distress  Abdomen: gravid, soft, non-tender  Pelvic: Cervical exam deferred         Fetal Status:     Movement: Present    Fetal Surveillance Testing today: NST: FHR baseline 140 bpm, Variability: moderate, Accelerations:present, Decelerations:  Absent= Cat 1/Reactive   Results for orders placed or performed in visit on 09/07/17 (from the past 24 hour(s))  POCT urinalysis dipstick   Collection Time: 09/07/17  9:38 AM  Result Value Ref Range   Color, UA     Clarity, UA     Glucose, UA neg    Bilirubin, UA     Ketones, UA neg    Spec Grav, UA  1.010 - 1.025   Blood, UA neg    pH, UA  5.0 - 8.0   Protein, UA trace    Urobilinogen, UA  0.2 or 1.0 E.U./dL  Nitrite, UA neg    Leukocytes, UA Negative Negative   Appearance     Odor      Assessment & Plan:  1) High-risk pregnancy [id number] at [redacted]w[redacted]d with an Estimated Date of Delivery: 09/21/17   2) A2DM:  FBS <91  and 2 hr pp <115, stable  3) ,   Labs/procedures today:   Medications: Metformin 500 mg am and 1000 mg pm  Treatment Plan:  NbST twice weekly and IOL 39 weeks  Follow-up: Return in about 3 days (around 09/10/2017) for HROB, NST. and Schedule IOL   Orders Placed This Encounter  Procedures  . POCT urinalysis dipstick   Jacklyn Shell CNM 09/07/2017 10:07 AM

## 2017-09-07 NOTE — Patient Instructions (Signed)

## 2017-09-10 ENCOUNTER — Encounter: Payer: Self-pay | Admitting: Women's Health

## 2017-09-10 ENCOUNTER — Ambulatory Visit (INDEPENDENT_AMBULATORY_CARE_PROVIDER_SITE_OTHER): Payer: Medicaid Other | Admitting: Women's Health

## 2017-09-10 VITALS — BP 120/70 | HR 96 | Wt 281.0 lb

## 2017-09-10 DIAGNOSIS — Z331 Pregnant state, incidental: Secondary | ICD-10-CM | POA: Diagnosis not present

## 2017-09-10 DIAGNOSIS — O24415 Gestational diabetes mellitus in pregnancy, controlled by oral hypoglycemic drugs: Secondary | ICD-10-CM

## 2017-09-10 DIAGNOSIS — Z1389 Encounter for screening for other disorder: Secondary | ICD-10-CM | POA: Diagnosis not present

## 2017-09-10 DIAGNOSIS — O24419 Gestational diabetes mellitus in pregnancy, unspecified control: Secondary | ICD-10-CM

## 2017-09-10 DIAGNOSIS — Z3A38 38 weeks gestation of pregnancy: Secondary | ICD-10-CM

## 2017-09-10 DIAGNOSIS — O0993 Supervision of high risk pregnancy, unspecified, third trimester: Secondary | ICD-10-CM

## 2017-09-10 LAB — POCT URINALYSIS DIPSTICK
GLUCOSE UA: NEGATIVE
Leukocytes, UA: NEGATIVE
Nitrite, UA: NEGATIVE
RBC UA: NEGATIVE

## 2017-09-10 NOTE — Treatment Plan (Signed)
Induction Assessment Scheduling Form Fax to [facility]:  [phone]  Linda KannerStacey Horger                                                                                   DOB:  12/09/1990                                                            MRN:  [id number]                                                                     Phone #:   [phone]                         Provider:  [facility]  GP:  [id number]                                                            Estimated Date of Delivery: 09/21/17  Dating Criteria: LMP c/w 7wk u/s    Medical Indications for induction:  A2DM Admission Date/Time:  4/17 @ [facility] Gestational age on admission:  39.1 (nothing available 4/16 at 39.0)   Filed Weights   09/10/17 1241  Weight: 281 lb (127.5 kg)   HIV:  Non Reactive (01/16 0839) GBS:    0.5/th/-3, vtx   Method of induction(proposed):  Cytotec, FB   Scheduling Provider Signature:  Cheral MarkerKimberly R Linda Skinner, CNM                                            Today's Date:  09/10/2017

## 2017-09-10 NOTE — Patient Instructions (Signed)
Drusilla KannerStacey Whidby, I greatly value your feedback.  If you receive a survey following your visit with us today, we appreciate you taking the time to fill it out.  Thanks, Joellyn HaffKim Charles Andringa, CNM, WHNP-BC   Your induction is scheduled for Tuesday night 4/16 @ 11:45pm. [facility] and let them know you are there to be induced. They will send someone from Labor & Delivery to come get you.    Call the office [phone]) or [facility] to [facility] if:  You begin to have strong, frequent contractions  Your water breaks.  Sometimes it is a big gush of fluid, sometimes it is just a trickle that keeps getting your panties wet or running down your legs  You have vaginal bleeding.  It is normal to have a small amount of spotting if your cervix was checked.   You don't feel your baby moving like normal.  If you don't, get you something to eat and drink and lay down and focus on feeling your baby move.  You should feel at least 10 movements in 2 hours.  If you don't, you should call the office or [facility] to [facility].     [facility] Cont[facility] Contractions of the uterus can occur throughout pregnancy, but they are not always a sign that you are in labor. You may have practice contractions called Braxton Hicks contractions. These false labor contractions are sometimes confused with true labor. What are Deberah PeltonBraxton Hicks contractions? Braxton Hicks contractions are tightening movements that occur in the muscles of the uterus before labor. Unlike true labor contractions, these contractions do not result in opening (dilation) and thinning of the cervix. Toward the end of pregnancy (32-34 weeks), Braxton Hicks contractions can happen more often and may become stronger. These contractions are sometimes difficult to tell apart from true labor because they can be very uncomfortable. You should not feel embarrassed if you go to the hospital with false labor. Sometimes,  the only way to tell if you are in true labor is for your health care provider to look for changes in the cervix. The health care provider will do a physical exam and may monitor your contractions. If you are not in true labor, the exam should show that your cervix is not dilating and your water has not broken. If there are other health problems associated with your pregnancy, it is completely safe for you to be sent home with false labor. You may continue to have Braxton Hicks contractions until you go into true labor. How to tell the difference between true labor and false labor True labor  Contractions last 30-70 seconds.  Contractions become very regular.  Discomfort is usually felt in the top of the uterus, and it spreads to the lower abdomen and low back.  Contractions do not go away with walking.  Contractions usually become more intense and increase in frequency.  The cervix dilates and gets thinner. False labor  Contractions are usually shorter and not as strong as true labor contractions.  Contractions are usually irregular.  Contractions are often felt in the front of the lower abdomen and in the groin.  Contractions may go away when you walk around or change positions while lying down.  Contractions get weaker and are shorter-lasting as time goes on.  The cervix usually does not dilate or become thin. Follow these instructions at home:  Take over-the-counter and prescription medicines only as told by your health care provider.  Keep up with your usual exercises and follow other instructions from your health care provider.  Eat and drink lightly if you think you are going into labor.  If Braxton Hicks contractions are making you uncomfortable: ? Change your position from lying down or resting to walking, or change from walking to resting. ? Sit and rest in a tub of warm water. ? Drink enough fluid to keep your urine pale yellow. Dehydration may cause these  contractions. ? Do slow and deep breathing several times an hour.  Keep all follow-up prenatal visits as told by your health care provider. This is important. Contact a health care provider if:  You have a fever.  You have continuous pain in your abdomen. Get help right away if:  Your contractions become stronger, more regular, and closer together.  You have fluid leaking or gushing from your vagina.  You pass blood-tinged mucus (bloody show).  You have bleeding from your vagina.  You have low back pain that you never had before.  You feel your baby's head pushing down and causing pelvic pressure.  Your baby is not moving inside you as much as it used to. Summary  Contractions that occur before labor are called Braxton Hicks contractions, false labor, or practice contractions.  Braxton Hicks contractions are usually shorter, weaker, farther apart, and less regular than true labor contractions. True labor contractions usually become progressively stronger and regular and they become more frequent.  Manage discomfort from [facility] contractions by changing position, resting in a warm bath, drinking plenty of water, or practicing deep breathing. This information is not intended to replace advice given to you by your health care provider. Make sure you discuss any questions you have with your health care provider. Document Released: 10/01/2016 Document Revised: 10/01/2016 Document Reviewed: 10/01/2016 Elsevier Interactive Patient Education  2018 [employer]

## 2017-09-10 NOTE — Progress Notes (Signed)
   HIGH-RISK PREGNANCY VISIT Patient name: Linda Skinner MRN [id number]  Date of birth: 06/21/1990 Chief Complaint:   high risk ob (NST)  History of Present Illness:   Linda KannerStacey Skinner is a 27 y.o. [id number] female at 1676w3d with an Estimated Date of Delivery: 09/21/17 being seen today for ongoing management of a high-risk pregnancy complicated by A2DM currently on metformin 500mg  AM/1,000mg  PM.  Today she reports all sugars wnl, no complaints. Contractions: Irregular.  .  Movement: Present. denies leaking of fluid.  Review of Systems:   Pertinent items are noted in HPI Denies abnormal vaginal discharge w/ itching/odor/irritation, headaches, visual changes, shortness of breath, chest pain, abdominal pain, severe nausea/vomiting, or problems with urination or bowel movements unless otherwise stated above. Pertinent History Reviewed:  Reviewed past medical,surgical, social, obstetrical and family history.  Reviewed problem list, medications and allergies. Physical Assessment:   Vitals:   09/10/17 1241  BP: 120/70  Pulse: 96  Weight: 281 lb (127.5 kg)  Body mass index is 48.23 kg/m.           Physical Examination:   General appearance: alert, well appearing, and in no distress  Mental status: alert, oriented to person, place, and time  Skin: warm & dry   Extremities: Edema: None    Cardiovascular: normal heart rate noted  Respiratory: normal respiratory effort, no distress  Abdomen: gravid, soft, non-tender  Pelvic: Cervical exam performed  Dilation: Fingertip Effacement (%): Thick Station: -3  Fetal Status:   Fundal Height: 39 cm Movement: Present Presentation: Vertex  Fetal Surveillance Testing today: NST: FHR baseline 130 bpm, Variability: moderate, Accelerations:present, Decelerations:  Absent= Cat 1/Reactive Toco: none     Results for orders placed or performed in visit on 09/10/17 (from the past 24 hour(s))  POCT urinalysis dipstick   Collection Time: 09/10/17 12:45 PM  Result  Value Ref Range   Color, UA     Clarity, UA     Glucose, UA neg    Bilirubin, UA     Ketones, UA small    Spec Grav, UA  1.010 - 1.025   Blood, UA neg    pH, UA  5.0 - 8.0   Protein, UA trace    Urobilinogen, UA  0.2 or 1.0 E.U./dL   Nitrite, UA neg    Leukocytes, UA Negative Negative   Appearance     Odor      Assessment & Plan:  1) High-risk pregnancy [id number] at 8476w3d with an Estimated Date of Delivery: 09/21/17   2) A2DM, stable, continue metformin 500mg  AM/1,000mg  PM  Meds: No orders of the defined types were placed in this encounter.   Labs/procedures today: nst, SVE  Treatment Plan:  IOL @ 39wks, scheduled for 4/17 @ [place] at 39.1wks (1st available).  IOL form faxed via Epic and orders placed   Reviewed: Term labor symptoms and general obstetric precautions including but not limited to vaginal bleeding, contractions, leaking of fluid and fetal movement were reviewed in detail with the patient.  All questions were answered.  Follow-up: Return in about 4 days (around 09/14/2017) for HROB, NST.  Orders Placed This Encounter  Procedures  . POCT urinalysis dipstick   Cheral MarkerKimberly R Anitha Kreiser CNM, [facility]NP-BC 09/10/2017 4:16 PM

## 2017-09-13 ENCOUNTER — Encounter (HOSPITAL_COMMUNITY): Payer: Self-pay | Admitting: *Deleted

## 2017-09-13 ENCOUNTER — Telehealth (HOSPITAL_COMMUNITY): Payer: Self-pay | Admitting: *Deleted

## 2017-09-13 NOTE — Telephone Encounter (Signed)
Preadmission screen  

## 2017-09-14 ENCOUNTER — Encounter (HOSPITAL_COMMUNITY): Payer: Self-pay | Admitting: *Deleted

## 2017-09-14 ENCOUNTER — Encounter: Payer: Self-pay | Admitting: Women's Health

## 2017-09-14 ENCOUNTER — Inpatient Hospital Stay (HOSPITAL_COMMUNITY)
Admission: AD | Admit: 2017-09-14 | Discharge: 2017-09-16 | DRG: 798 | Disposition: A | Discharge status: Home / Self Care | Payer: Medicaid Other | Source: Ambulatory Visit | Attending: Obstetrics & Gynecology | Admitting: Obstetrics & Gynecology

## 2017-09-14 ENCOUNTER — Ambulatory Visit (INDEPENDENT_AMBULATORY_CARE_PROVIDER_SITE_OTHER): Payer: Medicaid Other | Admitting: Women's Health

## 2017-09-14 VITALS — BP 120/60 | HR 102 | Wt 283.5 lb

## 2017-09-14 DIAGNOSIS — O368130 Decreased fetal movements, third trimester, not applicable or unspecified: Secondary | ICD-10-CM

## 2017-09-14 DIAGNOSIS — O24425 Gestational diabetes mellitus in childbirth, controlled by oral hypoglycemic drugs: Secondary | ICD-10-CM | POA: Diagnosis present

## 2017-09-14 DIAGNOSIS — Z3A39 39 weeks gestation of pregnancy: Secondary | ICD-10-CM

## 2017-09-14 DIAGNOSIS — Z1389 Encounter for screening for other disorder: Secondary | ICD-10-CM

## 2017-09-14 DIAGNOSIS — O24415 Gestational diabetes mellitus in pregnancy, controlled by oral hypoglycemic drugs: Secondary | ICD-10-CM

## 2017-09-14 DIAGNOSIS — O99214 Obesity complicating childbirth: Secondary | ICD-10-CM | POA: Diagnosis present

## 2017-09-14 DIAGNOSIS — O9962 Diseases of the digestive system complicating childbirth: Secondary | ICD-10-CM | POA: Diagnosis present

## 2017-09-14 DIAGNOSIS — O99824 Streptococcus B carrier state complicating childbirth: Secondary | ICD-10-CM | POA: Diagnosis present

## 2017-09-14 DIAGNOSIS — K219 Gastro-esophageal reflux disease without esophagitis: Secondary | ICD-10-CM | POA: Diagnosis present

## 2017-09-14 DIAGNOSIS — O0990 Supervision of high risk pregnancy, unspecified, unspecified trimester: Secondary | ICD-10-CM

## 2017-09-14 DIAGNOSIS — Z302 Encounter for sterilization: Secondary | ICD-10-CM | POA: Diagnosis not present

## 2017-09-14 DIAGNOSIS — O24429 Gestational diabetes mellitus in childbirth, unspecified control: Secondary | ICD-10-CM | POA: Diagnosis not present

## 2017-09-14 DIAGNOSIS — Z87891 Personal history of nicotine dependence: Secondary | ICD-10-CM | POA: Diagnosis not present

## 2017-09-14 DIAGNOSIS — O24419 Gestational diabetes mellitus in pregnancy, unspecified control: Secondary | ICD-10-CM

## 2017-09-14 DIAGNOSIS — Z331 Pregnant state, incidental: Secondary | ICD-10-CM

## 2017-09-14 DIAGNOSIS — O0993 Supervision of high risk pregnancy, unspecified, third trimester: Secondary | ICD-10-CM

## 2017-09-14 LAB — CBC
HCT: 34 % — ABNORMAL LOW (ref 36.0–46.0)
HEMOGLOBIN: 11.4 g/dL — AB (ref 12.0–15.0)
MCH: 28 pg (ref 26.0–34.0)
MCHC: 33.5 g/dL (ref 30.0–36.0)
MCV: 83.5 fL (ref 78.0–100.0)
Platelets: 184 10*3/uL (ref 150–400)
RBC: 4.07 MIL/uL (ref 3.87–5.11)
RDW: 14 % (ref 11.5–15.5)
WBC: 5.1 10*3/uL (ref 4.0–10.5)

## 2017-09-14 LAB — POCT URINALYSIS DIPSTICK
Glucose, UA: NEGATIVE
KETONES UA: NEGATIVE
NITRITE UA: NEGATIVE
PROTEIN UA: NEGATIVE

## 2017-09-14 LAB — TYPE AND SCREEN
ABO/RH(D): A POS
ANTIBODY SCREEN: NEGATIVE

## 2017-09-14 LAB — GLUCOSE, CAPILLARY
GLUCOSE-CAPILLARY: 70 mg/dL (ref 65–99)
Glucose-Capillary: 79 mg/dL (ref 65–99)
Glucose-Capillary: 93 mg/dL (ref 65–99)

## 2017-09-14 LAB — ABO/RH: ABO/RH(D): A POS

## 2017-09-14 LAB — OB RESULTS CONSOLE GBS: GBS: POSITIVE

## 2017-09-14 MED ORDER — ACETAMINOPHEN 325 MG PO TABS: 650.0000 mg | ORAL_TABLET | ORAL | Status: DC | PRN

## 2017-09-14 MED ORDER — TERBUTALINE SULFATE 1 MG/ML IJ SOLN: 0.2500 mg | Freq: Once | INTRAMUSCULAR | Status: DC | PRN

## 2017-09-14 MED ORDER — PENICILLIN G POT IN DEXTROSE 60000 UNIT/ML IV SOLN
3.0000 10*6.[IU] | INTRAVENOUS | Status: DC
  Administered 2017-09-14 – 2017-09-15 (×4): 3 10*6.[IU] via INTRAVENOUS
  Filled 2017-09-14 (×8): qty 50

## 2017-09-14 MED ORDER — SODIUM CHLORIDE 0.9 % IV SOLN
5.0000 10*6.[IU] | Freq: Once | INTRAVENOUS | Status: AC
  Administered 2017-09-14: 5 10*6.[IU] via INTRAVENOUS
  Filled 2017-09-14: qty 5

## 2017-09-14 MED ORDER — OXYTOCIN 40 UNITS IN LACTATED RINGERS INFUSION - SIMPLE MED
1.0000 m[IU]/min | INTRAVENOUS | Status: DC
  Administered 2017-09-14: 2 m[IU]/min via INTRAVENOUS
  Administered 2017-09-15: 62.5 mL via INTRAVENOUS
  Administered 2017-09-15: 65 mL via INTRAVENOUS

## 2017-09-14 MED ORDER — MISOPROSTOL 25 MCG QUARTER TABLET: 25.0000 ug | ORAL_TABLET | ORAL | Status: DC | PRN

## 2017-09-14 MED ORDER — FLEET ENEMA 7-19 GM/118ML RE ENEM: 1.0000 | ENEMA | RECTAL | Status: DC | PRN

## 2017-09-14 MED ORDER — SOD CITRATE-CITRIC ACID 500-334 MG/5ML PO SOLN
30.0000 mL | ORAL | Status: DC | PRN
  Filled 2017-09-14: qty 15

## 2017-09-14 MED ORDER — LACTATED RINGERS IV SOLN: 500.0000 mL | INTRAVENOUS | Status: DC | PRN

## 2017-09-14 MED ORDER — LIDOCAINE HCL (PF) 1 % IJ SOLN: 30.0000 mL | INTRAMUSCULAR | Status: DC | PRN

## 2017-09-14 MED ORDER — ONDANSETRON HCL 4 MG/2ML IJ SOLN: 4.0000 mg | Freq: Four times a day (QID) | INTRAMUSCULAR | Status: DC | PRN

## 2017-09-14 MED ORDER — OXYCODONE-ACETAMINOPHEN 5-325 MG PO TABS: 2.0000 | ORAL_TABLET | ORAL | Status: DC | PRN

## 2017-09-14 MED ORDER — LACTATED RINGERS IV SOLN
INTRAVENOUS | Status: DC
  Administered 2017-09-14 – 2017-09-15 (×2): via INTRAVENOUS

## 2017-09-14 MED ORDER — OXYCODONE-ACETAMINOPHEN 5-325 MG PO TABS: 1.0000 | ORAL_TABLET | ORAL | Status: DC | PRN

## 2017-09-14 MED ORDER — HYDROXYZINE HCL 50 MG PO TABS: 50.0000 mg | ORAL_TABLET | Freq: Four times a day (QID) | ORAL | Status: DC | PRN

## 2017-09-14 MED ORDER — FENTANYL CITRATE (PF) 100 MCG/2ML IJ SOLN
50.0000 ug | INTRAMUSCULAR | Status: DC | PRN
  Administered 2017-09-14 – 2017-09-15 (×3): 100 ug via INTRAVENOUS
  Filled 2017-09-14 (×3): qty 2

## 2017-09-14 MED ORDER — OXYTOCIN 40 UNITS IN LACTATED RINGERS INFUSION - SIMPLE MED
2.5000 [IU]/h | INTRAVENOUS | Status: DC
  Filled 2017-09-14: qty 1000

## 2017-09-14 MED ORDER — OXYTOCIN BOLUS FROM INFUSION
500.0000 mL | Freq: Once | INTRAVENOUS | Status: AC
  Administered 2017-09-15: 500 mL via INTRAVENOUS

## 2017-09-14 NOTE — Progress Notes (Signed)
Subjective: Doing well with no issues.   Objective: BP (!) 141/69   Pulse 99   Temp 98.4 F (36.9 C) (Oral)   Resp 18   LMP 12/15/2016 (Approximate)  No intake/output data recorded. No intake/output data recorded.  FHT:  FHR: 130 bpm, variability: minimal ,  accelerations:  Abscent,  decelerations:  Absent UC:   irregular, every 6-7 minutes SVE:    1/thick/-3, posterior, moderate consistency.  Labs: Lab Results  Component Value Date   WBC 5.1 09/14/2017   HGB 11.4 (L) 09/14/2017   HCT 34.0 (L) 09/14/2017   MCV 83.5 09/14/2017   PLT 184 09/14/2017    Assessment / Plan: Induction of labor due to gestational diabetes,  progressing well on pitocin  Labor: foley bulb Preeclampsia:  N/a Fetal Wellbeing:  Category I Pain Control:  Labor support without medications I/D:  PCN for gbs ppx Anticipated MOD:  NSVD  Myrene BuddyJacob Phuong Moffatt 09/14/2017, 9:13 PM

## 2017-09-14 NOTE — Progress Notes (Signed)
HIGH-RISK PREGNANCY VISIT Patient name: Linda Skinner MRN [id number]  Date of birth: 02/20/1991 Chief Complaint:   High Risk Gestation (NST; ? losing mucus plug)  History of Present Illness:   Linda Skinner is a 27 y.o. [id number] female at 245w0d with an Estimated Date of Delivery: 09/21/17 being seen today for ongoing management of a high-risk pregnancy complicated by A2DM currently on metformin 500mg  AM/1,000mg  PM.  Today she reports decreased fm this am, thinks she lost her mucous plug over the weekend. All sugars normal.  Contractions: Irregular. Vag. Bleeding: None.  Movement: (!) Decreased. denies leaking of fluid.  Review of Systems:   Pertinent items are noted in HPI Denies abnormal vaginal discharge w/ itching/odor/irritation, headaches, visual changes, shortness of breath, chest pain, abdominal pain, severe nausea/vomiting, or problems with urination or bowel movements unless otherwise stated above. Pertinent History Reviewed:  Reviewed past medical,surgical, social, obstetrical and family history.  Reviewed problem list, medications and allergies. Physical Assessment:   Vitals:   09/14/17 1018  BP: 120/60  Pulse: (!) 102  Weight: 283 lb 8 oz (128.6 kg)  Body mass index is 48.66 kg/m.           Physical Examination:   General appearance: alert, well appearing, and in no distress  Mental status: alert, oriented to person, place, and time  Skin: warm & dry   Extremities: Edema: None    Cardiovascular: normal heart rate noted  Respiratory: normal respiratory effort, no distress  Abdomen: gravid, soft, non-tender  Pelvic: Cervical exam performed  Dilation: 1 Effacement (%): Thick Station: -3  Fetal Status: Fetal Heart Rate (bpm): 130 Fundal Height: 40 cm Movement: (!) Decreased Presentation: Vertex  Fetal Surveillance Testing today: NST: FHR baseline 130 bpm, Variability: moderate, Accelerations: 10x10s, one 15x15 w/ scalp stim, Decelerations:  Present one 20sec variable to  nadir of 110= Cat 2/non-reactive Toco: q 5-159mins     Results for orders placed or performed in visit on 09/14/17 (from the past 24 hour(s))  POCT urinalysis dipstick   Collection Time: 09/14/17 10:19 AM  Result Value Ref Range   Color, UA     Clarity, UA     Glucose, UA neg    Bilirubin, UA     Ketones, UA neg    Spec Grav, UA  1.010 - 1.025   Blood, UA 1+    pH, UA  5.0 - 8.0   Protein, UA neg    Urobilinogen, UA  0.2 or 1.0 E.U./dL   Nitrite, UA neg    Leukocytes, UA Trace (A) Negative   Appearance     Odor      Assessment & Plan:  1) High-risk pregnancy [id number] at 385w0d with an Estimated Date of Delivery: 09/21/17   2) A2DM, stable, last efw 85% at 36wks  3) Decreased fm/non-reactive NST, [facility] discussed w/ [facility]. Jolayne Pantheronstant [facility], go ahead and direct admit for IOL now. Called L&D charge, and Sharen CounterLisa Leftwich-Kirby, CNM  Meds: No orders of the defined types were placed in this encounter.   Labs/procedures today: nst, sve  [facility]  [facility]  Reviewed: Term labor symptoms and general obstetric precautions including but not limited to vaginal bleeding, contractions, leaking of fluid and fetal movement were reviewed in detail with the patient.  All questions were answered.  Follow-up: Return for 4-6wks for pp visit.  Orders Placed This Encounter  Procedures  . POCT urinalysis dipstick   Cheral MarkerKimberly R Booker  CNM, WHNP-BC 09/14/2017 11:46 AM

## 2017-09-14 NOTE — MAU Note (Signed)
Pt arrived by EMS. Direct admit for induction of labor.  Was scheduled for midnight tonight.  Today at appt, c/o decreased fetal movement.  Some variables on monitor per note. Gestational diabetic. Having some irreg contractions.

## 2017-09-14 NOTE — Progress Notes (Signed)
Linda Skinner is a 27 y.o. [id number] at 733w0d by LMP admitted for induction of labor due to NR NST, decreased fetal movement, A2DM.  Subjective: Pt comfortable, without cramping/contractions.  Objective: BP 120/61   Pulse (!) 134   Temp 98.3 F (36.8 C) (Oral)   Resp 18   LMP 12/15/2016 (Approximate)  No intake/output data recorded. No intake/output data recorded.  FHT:  FHR: 135 bpm, variability: moderate,  accelerations:  Present,  decelerations:  Absent UC:   rare SVE:    1/thick/-3, posterior, moderate consistency FB placed with mild difficulty. Pt tolerated well.   Labs: Lab Results  Component Value Date   WBC 5.1 09/14/2017   HGB 11.4 (L) 09/14/2017   HCT 34.0 (L) 09/14/2017   MCV 83.5 09/14/2017   PLT 184 09/14/2017    Assessment / Plan: Induction of labor due to non-reassuring fetal testing and gestational diabetes FB in place  Labor: Progressing normally Preeclampsia:  labs stable Fetal Wellbeing:  Category I Pain Control:  Labor support without medications I/D:  GBS positive on PCN Anticipated MOD:  NSVD  Sharen CounterLisa Leftwich-Kirby 09/14/2017, 6:02 PM

## 2017-09-14 NOTE — H&P (Addendum)
OBSTETRIC ADMISSION HISTORY AND PHYSICAL  Linda Skinner is a 27 y.o. female [phone] with IUP at 3259w0d by U/S presenting for decreased fetal movement/non-reactive NST. She reports decreased FMs, No LOF, no VB, no blurry vision, headaches or peripheral edema, and RUQ pain.  She plans on bottle  feeding. She request Tubal Ligation for birth control. [facility]   Dating: By U/S --->  Estimated Date of Delivery: 09/21/17  Sono:    @36  w0d, CWD, normal anatomy, cephalic presentation, 3203g, 14%85% EFW   Prenatal History/Complications:  Past Medical History: Past Medical History:  Diagnosis Date  . Gestational diabetes    metformin  . Medical history non-contributory     Past Surgical History: Past Surgical History:  Procedure Laterality Date  . MOUTH SURGERY    . WISDOM TOOTH EXTRACTION      Obstetrical History: OB History    Gravida  6   Para  3   Term  3   Preterm      AB  2   Living  3     SAB      TAB      Ectopic      Multiple      Live Births  3           Social History: Social History   Socioeconomic History  . Marital status: Single    Spouse name: Not on file  . Number of children: Not on file  . Years of education: Not on file  . Highest education level: Not on file  Occupational History  . Not on file  Social Needs  . Financial resource strain: Not on file  . Food insecurity:    Worry: Not on file    Inability: Not on file  . Transportation needs:    Medical: Not on file    Non-medical: Not on file  Tobacco Use  . Smoking status: Former Smoker    Packs/day: 0.01    Years: 4.00    Pack years: 0.04    Types: Cigars  . Smokeless tobacco: Never Used  Substance and Sexual Activity  . Alcohol use: No  . Drug use: No  . Sexual activity: Yes    Birth control/protection: None  Lifestyle  . Physical activity:    Days per week: Not on file    Minutes per session: Not on file  . Stress: Not on file   Relationships  . Social connections:    Talks on phone: Not on file    Gets together: Not on file    Attends religious service: Not on file    Active member of club or organization: Not on file    Attends meetings of clubs or organizations: Not on file    Relationship status: Not on file  Other Topics Concern  . Not on file  Social History Narrative  . Not on file    Family History: Family History  Problem Relation Age of Onset  . Lupus Mother   . Cancer Paternal Grandfather   . Atrial fibrillation Maternal Grandmother     Allergies: No Known Allergies  Medications Prior to Admission  Medication Sig Dispense Refill Last Dose  . ferrous sulfate 325 (65 FE) MG tablet Take 1 tablet (325 mg total) by mouth 2 (two) times daily with a meal. 60 tablet 3 09/14/2017 at Unknown time  . imiquimod (ALDARA) 5 % cream Apply topically 3 (three) times a week. 12 each  3 09/13/2017 at Unknown time  . metFORMIN (GLUCOPHAGE) 500 MG tablet 1 tablet in the morning and 2 tablets at supper 90 tablet 1 09/14/2017 at Unknown time  . omeprazole (PRILOSEC) 20 MG capsule Take 1 capsule (20 mg total) by mouth daily. 1 tablet a day 30 capsule 6 09/13/2017 at Unknown time  . prenatal vitamin w/FE, FA (PRENATAL 1 + 1) 27-1 MG TABS tablet Take 1 tablet by mouth daily at 12 noon. 30 each 12 09/13/2017 at Unknown time     Review of Systems   All systems reviewed and negative except as stated in HPI  Last menstrual period 12/15/2016. General appearance: alert and cooperative Lungs: clear to auscultation bilaterally Heart: regular rate and rhythm Abdomen: soft, non-tender; bowel sounds normal Pelvic: Dilation 1 Effacement thick Station -3 Extremities: Homans sign is negative, no sign of DVT DTR's normal Presentation: unsure Fetal monitoringBaseline: 130 bpm, Variability: Good {> 6 bpm), Accelerations: Reactive and Decelerations: Absent Uterine activity IrregularFrequency: Every 6 minutes and Duration: 15  seconds     Prenatal labs: ABO, Rh: A/Positive/-- (09/19 1047) Antibody: Negative (01/16 0839) Rubella: 2.77 (09/19 1047) RPR: Non Reactive (01/16 0839)  HBsAg: Negative (09/19 1047)  HIV: Non Reactive (01/16 0839)  GBS:   GBS positive- Penicillin prophylaxis 1 hr Glucola: Gestational diabetes controlled on Metformin- Failed 2hr  Genetic screening  Normal  Anatomy US Normal   Prenatal Transfer Tool  Maternal Diabetes: Yes:  Diabetes Type:  Insulin/Medication controlled Genetic Screening: Normal Maternal Ultrasounds/Referrals: Normal Fetal Ultrasounds or other Referrals:  None Maternal Substance Abuse:  No Significant Maternal Medications:  None Significant Maternal Lab Results: Lab values include: Group B Strep positive PCN prophylaxis   Results for orders placed or performed during the hospital encounter of 09/14/17 (from the past 24 hour(s))  CBC   Collection Time: 09/14/17  1:40 PM  Result Value Ref Range   WBC 5.1 4.0 - 10.5 K/uL   RBC 4.07 3.87 - 5.11 MIL/uL   Hemoglobin 11.4 (L) 12.0 - 15.0 g/dL   HCT 16.1 (L) 09.6 - 04.5 %   MCV 83.5 78.0 - 100.0 fL   MCH 28.0 26.0 - 34.0 pg   MCHC 33.5 30.0 - 36.0 g/dL   RDW 40.9 81.1 - 91.4 %   Platelets 184 150 - 400 K/uL  Glucose, capillary   Collection Time: 09/14/17  1:43 PM  Result Value Ref Range   Glucose-Capillary 79 65 - 99 mg/dL  Results for orders placed or performed in visit on 09/14/17 (from the past 24 hour(s))  POCT urinalysis dipstick   Collection Time: 09/14/17 10:19 AM  Result Value Ref Range   Color, UA     Clarity, UA     Glucose, UA neg    Bilirubin, UA     Ketones, UA neg    Spec Grav, UA  1.010 - 1.025   Blood, UA 1+    pH, UA  5.0 - 8.0   Protein, UA neg    Urobilinogen, UA  0.2 or 1.0 E.U./dL   Nitrite, UA neg    Leukocytes, UA Trace (A) Negative   Appearance     Odor      Patient Active Problem List   Diagnosis Date Noted  . Gestational diabetes mellitus, class A2 06/17/2017  .  Ovarian cyst 05/26/2017  . Maternal varicella, non-immune 02/18/2017  . Supervision of high risk pregnancy, antepartum 02/17/2017    Assessment/Plan:  Linda Skinner is a 27 y.o. [id number] at [redacted]w[redacted]d here  for decreased fetal movement/non-reactive NST  #Labor:Induction with Cytotec, progressing well  #Pain: IV pain meds, Epidural upon request #FWB: Category 1 #ID:  GBS+-PCN prophylaxis  #MOF: Bottle  #MOC: Tubal Ligation  #[facility]   Beverly Sessions, Student-PA  09/14/2017, 2:29 PM  Midwife attestation: I have seen and examined this patient; I agree with above documentation in the PA student's note.   Linda Skinner is a 27 y.o. [phone] here for IOL for nonreactive NST, decreased fetal movement at 39 weeks, IOL was scheduled for midnight today for GDM but pt was admitted early following today's NST.  PE: BP (!) 141/69   Pulse 99   Temp 98.4 F (36.9 C) (Oral)   Resp 18   LMP 12/15/2016 (Approximate)  Gen: calm comfortable, NAD Resp: normal effort, no distress Abd: gravid  ROS, labs, PMH reviewed  Plan: Admit to LD Labor: active management, FB placed on admission  FWB: FHR baseline 135, positive accels, no decels, ctx rare, mild to palpation ID: GBS positive, PCN  Sharen Counter, CNM  09/14/2017, 9:09 PM

## 2017-09-14 NOTE — Patient Instructions (Signed)
Drusilla KannerStacey Herro, I greatly value your feedback.  If you receive a survey following your visit with us today, we appreciate you taking the time to fill it out.  Thanks, Joellyn HaffKim Chaim Gatley, CNM, WHNP-BC   Call the office [phone]) or go to [facility] if:  You begin to have strong, frequent contractions  Your water breaks.  Sometimes it is a big gush of fluid, sometimes it is just a trickle that keeps getting your panties wet or running down your legs  You have vaginal bleeding.  It is normal to have a small amount of spotting if your cervix was checked.   You don't feel your baby moving like normal.  If you don't, get you something to eat and drink and lay down and focus on feeling your baby move.  You should feel at least 10 movements in 2 hours.  If you don't, you should call the office or go to [facility].     [facility] IncBraxton Hicks Contractions Contractions of the uterus can occur throughout pregnancy, but they are not always a sign that you are in labor. You may have practice contractions called Braxton Hicks contractions. These false labor contractions are sometimes confused with true labor. What are Deberah PeltonBraxton Hicks contractions? Braxton Hicks contractions are tightening movements that occur in the muscles of the uterus before labor. Unlike true labor contractions, these contractions do not result in opening (dilation) and thinning of the cervix. Toward the end of pregnancy (32-34 weeks), Braxton Hicks contractions can happen more often and may become stronger. These contractions are sometimes difficult to tell apart from true labor because they can be very uncomfortable. You should not feel embarrassed if you go to the hospital with false labor. Sometimes, the only way to tell if you are in true labor is for your health care provider to look for changes in the cervix. The health care provider will do a physical exam and may monitor your contractions. If you are not in true labor, the exam should show  that your cervix is not dilating and your water has not broken. If there are other health problems associated with your pregnancy, it is completely safe for you to be sent home with false labor. You may continue to have Braxton Hicks contractions until you go into true labor. How to tell the difference between true labor and false labor True labor  Contractions last 30-70 seconds.  Contractions become very regular.  Discomfort is usually felt in the top of the uterus, and it spreads to the lower abdomen and low back.  Contractions do not go away with walking.  Contractions usually become more intense and increase in frequency.  The cervix dilates and gets thinner. False labor  Contractions are usually shorter and not as strong as true labor contractions.  Contractions are usually irregular.  Contractions are often felt in the front of the lower abdomen and in the groin.  Contractions may go away when you walk around or change positions while lying down.  Contractions get weaker and are shorter-lasting as time goes on.  The cervix usually does not dilate or become thin. Follow these instructions at home:  Take over-the-counter and prescription medicines only as told by your health care provider.  Keep up with your usual exercises and follow other instructions from your health care provider.  Eat and drink lightly if you think you are going into labor.  If Braxton Hicks contractions are making you uncomfortable: ? Change your position from lying down  or resting to walking, or change from walking to resting. ? Sit and rest in a tub of warm water. ? Drink enough fluid to keep your urine pale yellow. Dehydration may cause these contractions. ? Do slow and deep breathing several times an hour.  Keep all follow-up prenatal visits as told by your health care provider. This is important. Contact a health care provider if:  You have a fever.  You have continuous pain in your  abdomen. Get help right away if:  Your contractions become stronger, more regular, and closer together.  You have fluid leaking or gushing from your vagina.  You pass blood-tinged mucus (bloody show).  You have bleeding from your vagina.  You have low back pain that you never had before.  You feel your baby's head pushing down and causing pelvic pressure.  Your baby is not moving inside you as much as it used to. Summary  Contractions that occur before labor are called Braxton Hicks contractions, false labor, or practice contractions.  Braxton Hicks contractions are usually shorter, weaker, farther apart, and less regular than true labor contractions. True labor contractions usually become progressively stronger and regular and they become more frequent.  Manage discomfort from [facility] contractions by changing position, resting in a warm bath, drinking plenty of water, or practicing deep breathing. This information is not intended to replace advice given to you by your health care provider. Make sure you discuss any questions you have with your health care provider. Document Released: 10/01/2016 Document Revised: 10/01/2016 Document Reviewed: 10/01/2016 Elsevier Interactive Patient Education  2018 [employer].

## 2017-09-15 ENCOUNTER — Inpatient Hospital Stay (HOSPITAL_COMMUNITY): Payer: Medicaid Other | Admitting: Anesthesiology

## 2017-09-15 ENCOUNTER — Inpatient Hospital Stay (HOSPITAL_COMMUNITY): Admission: RE | Admit: 2017-09-15 | Payer: Medicaid Other | Source: Ambulatory Visit

## 2017-09-15 ENCOUNTER — Encounter (HOSPITAL_COMMUNITY): Payer: Self-pay

## 2017-09-15 ENCOUNTER — Encounter (HOSPITAL_COMMUNITY)
Admission: AD | Disposition: A | Discharge status: Home / Self Care | Payer: Self-pay | Source: Ambulatory Visit | Attending: Obstetrics & Gynecology

## 2017-09-15 ENCOUNTER — Encounter (HOSPITAL_COMMUNITY): Payer: Self-pay | Admitting: *Deleted

## 2017-09-15 ENCOUNTER — Telehealth: Payer: Self-pay | Admitting: *Deleted

## 2017-09-15 DIAGNOSIS — Z302 Encounter for sterilization: Secondary | ICD-10-CM

## 2017-09-15 DIAGNOSIS — O24429 Gestational diabetes mellitus in childbirth, unspecified control: Secondary | ICD-10-CM

## 2017-09-15 DIAGNOSIS — O368130 Decreased fetal movements, third trimester, not applicable or unspecified: Secondary | ICD-10-CM

## 2017-09-15 DIAGNOSIS — Z3A39 39 weeks gestation of pregnancy: Secondary | ICD-10-CM

## 2017-09-15 DIAGNOSIS — O99824 Streptococcus B carrier state complicating childbirth: Secondary | ICD-10-CM

## 2017-09-15 HISTORY — PX: TUBAL LIGATION: SHX77

## 2017-09-15 LAB — RPR: RPR Ser Ql: NONREACTIVE

## 2017-09-15 LAB — GLUCOSE, CAPILLARY
GLUCOSE-CAPILLARY: 92 mg/dL (ref 65–99)
GLUCOSE-CAPILLARY: 87 mg/dL (ref 65–99)
Glucose-Capillary: 86 mg/dL (ref 65–99)
Glucose-Capillary: 95 mg/dL (ref 65–99)

## 2017-09-15 SURGERY — LIGATION, FALLOPIAN TUBE, POSTPARTUM
Anesthesia: Epidural | Site: Abdomen | Wound class: Clean Contaminated

## 2017-09-15 MED ORDER — FENTANYL CITRATE (PF) 100 MCG/2ML IJ SOLN
INTRAMUSCULAR | Status: AC
  Filled 2017-09-15: qty 2

## 2017-09-15 MED ORDER — PRENATAL MULTIVITAMIN CH
1.0000 | ORAL_TABLET | Freq: Every day | ORAL | Status: DC
  Administered 2017-09-16: 1 via ORAL
  Filled 2017-09-15: qty 1

## 2017-09-15 MED ORDER — ONDANSETRON HCL 4 MG/2ML IJ SOLN: 4.0000 mg | INTRAMUSCULAR | Status: DC | PRN

## 2017-09-15 MED ORDER — ONDANSETRON HCL 4 MG PO TABS: 4.0000 mg | ORAL_TABLET | ORAL | Status: DC | PRN

## 2017-09-15 MED ORDER — FENTANYL 2.5 MCG/ML BUPIVACAINE 1/10 % EPIDURAL INFUSION (WH - ANES)
14.0000 mL/h | INTRAMUSCULAR | Status: DC | PRN
  Administered 2017-09-15: 12 mL/h via EPIDURAL
  Administered 2017-09-15: 14 mL/h via EPIDURAL
  Filled 2017-09-15 (×2): qty 100

## 2017-09-15 MED ORDER — EPHEDRINE 5 MG/ML INJ: 10.0000 mg | INTRAVENOUS | Status: DC | PRN

## 2017-09-15 MED ORDER — ZOLPIDEM TARTRATE 5 MG PO TABS: 5.0000 mg | ORAL_TABLET | Freq: Every evening | ORAL | Status: DC | PRN

## 2017-09-15 MED ORDER — MIDAZOLAM HCL 5 MG/5ML IJ SOLN
INTRAMUSCULAR | Status: DC | PRN
  Administered 2017-09-15 (×2): 1 mg via INTRAVENOUS

## 2017-09-15 MED ORDER — PHENYLEPHRINE 40 MCG/ML (10ML) SYRINGE FOR IV PUSH (FOR BLOOD PRESSURE SUPPORT)
80.0000 ug | PREFILLED_SYRINGE | INTRAVENOUS | Status: DC | PRN
  Filled 2017-09-15: qty 10

## 2017-09-15 MED ORDER — SODIUM BICARBONATE 8.4 % IV SOLN
INTRAVENOUS | Status: AC
  Filled 2017-09-15: qty 50

## 2017-09-15 MED ORDER — PHENYLEPHRINE 40 MCG/ML (10ML) SYRINGE FOR IV PUSH (FOR BLOOD PRESSURE SUPPORT)
PREFILLED_SYRINGE | INTRAVENOUS | Status: AC
  Filled 2017-09-15: qty 10

## 2017-09-15 MED ORDER — LACTATED RINGERS IV SOLN
500.0000 mL | Freq: Once | INTRAVENOUS | Status: AC
  Administered 2017-09-15: 500 mL via INTRAVENOUS

## 2017-09-15 MED ORDER — IBUPROFEN 600 MG PO TABS
600.0000 mg | ORAL_TABLET | Freq: Four times a day (QID) | ORAL | Status: DC
Start: 2017-09-15 — End: 2017-09-15

## 2017-09-15 MED ORDER — LACTATED RINGERS IV SOLN: 500.0000 mL | Freq: Once | INTRAVENOUS | Status: DC

## 2017-09-15 MED ORDER — LIDOCAINE HCL (PF) 1 % IJ SOLN
INTRAMUSCULAR | Status: DC | PRN
  Administered 2017-09-15 (×2): 5 mL via EPIDURAL

## 2017-09-15 MED ORDER — DIBUCAINE 1 % RE OINT: 1.0000 "application " | TOPICAL_OINTMENT | RECTAL | Status: DC | PRN

## 2017-09-15 MED ORDER — WITCH HAZEL-GLYCERIN EX PADS
1.0000 | MEDICATED_PAD | CUTANEOUS | Status: DC | PRN
Start: 2017-09-15 — End: 2017-09-15

## 2017-09-15 MED ORDER — ONDANSETRON HCL 4 MG/2ML IJ SOLN
INTRAMUSCULAR | Status: AC
  Filled 2017-09-15: qty 2

## 2017-09-15 MED ORDER — BUPIVACAINE HCL (PF) 0.25 % IJ SOLN
INTRAMUSCULAR | Status: DC | PRN
  Administered 2017-09-15: 10 mL

## 2017-09-15 MED ORDER — DIPHENHYDRAMINE HCL 25 MG PO CAPS: 25.0000 mg | ORAL_CAPSULE | Freq: Four times a day (QID) | ORAL | Status: DC | PRN

## 2017-09-15 MED ORDER — WITCH HAZEL-GLYCERIN EX PADS: 1.0000 "application " | MEDICATED_PAD | CUTANEOUS | Status: DC | PRN

## 2017-09-15 MED ORDER — SIMETHICONE 80 MG PO CHEW: 80.0000 mg | CHEWABLE_TABLET | ORAL | Status: DC | PRN

## 2017-09-15 MED ORDER — PHENYLEPHRINE 40 MCG/ML (10ML) SYRINGE FOR IV PUSH (FOR BLOOD PRESSURE SUPPORT): 80.0000 ug | PREFILLED_SYRINGE | INTRAVENOUS | Status: DC | PRN

## 2017-09-15 MED ORDER — COCONUT OIL OIL: 1.0000 "application " | TOPICAL_OIL | Status: DC | PRN

## 2017-09-15 MED ORDER — LIDOCAINE-EPINEPHRINE (PF) 2 %-1:200000 IJ SOLN
INTRAMUSCULAR | Status: AC
  Filled 2017-09-15: qty 20

## 2017-09-15 MED ORDER — PHENYLEPHRINE HCL 10 MG/ML IJ SOLN
INTRAMUSCULAR | Status: DC | PRN
  Administered 2017-09-15: 80 ug via INTRAVENOUS

## 2017-09-15 MED ORDER — ACETAMINOPHEN 325 MG PO TABS
650.0000 mg | ORAL_TABLET | ORAL | Status: DC | PRN
  Administered 2017-09-15: 650 mg via ORAL
  Filled 2017-09-15: qty 2

## 2017-09-15 MED ORDER — BENZOCAINE-MENTHOL 20-0.5 % EX AERO: 1.0000 "application " | INHALATION_SPRAY | CUTANEOUS | Status: DC | PRN

## 2017-09-15 MED ORDER — TETANUS-DIPHTH-ACELL PERTUSSIS 5-2.5-18.5 LF-MCG/0.5 IM SUSP: 0.5000 mL | Freq: Once | INTRAMUSCULAR | Status: DC

## 2017-09-15 MED ORDER — FENTANYL CITRATE (PF) 100 MCG/2ML IJ SOLN
INTRAMUSCULAR | Status: DC | PRN
  Administered 2017-09-15 (×2): 50 ug via INTRAVENOUS

## 2017-09-15 MED ORDER — BUPIVACAINE HCL (PF) 0.25 % IJ SOLN
INTRAMUSCULAR | Status: AC
  Filled 2017-09-15: qty 10

## 2017-09-15 MED ORDER — SODIUM BICARBONATE 8.4 % IV SOLN
INTRAVENOUS | Status: DC | PRN
  Administered 2017-09-15: 7 mL via EPIDURAL
  Administered 2017-09-15: 5 mL via EPIDURAL
  Administered 2017-09-15: 3 mL via EPIDURAL

## 2017-09-15 MED ORDER — MEASLES, MUMPS & RUBELLA VAC ~~LOC~~ INJ
0.5000 mL | INJECTION | Freq: Once | SUBCUTANEOUS | Status: DC
  Filled 2017-09-15: qty 0.5

## 2017-09-15 MED ORDER — TETANUS-DIPHTH-ACELL PERTUSSIS 5-2.5-18.5 LF-MCG/0.5 IM SUSP
0.5000 mL | Freq: Once | INTRAMUSCULAR | Status: AC
  Administered 2017-09-16: 0.5 mL via INTRAMUSCULAR
  Filled 2017-09-15: qty 0.5

## 2017-09-15 MED ORDER — DIPHENHYDRAMINE HCL 50 MG/ML IJ SOLN: 12.5000 mg | INTRAMUSCULAR | Status: DC | PRN

## 2017-09-15 MED ORDER — FENTANYL CITRATE (PF) 100 MCG/2ML IJ SOLN
25.0000 ug | INTRAMUSCULAR | Status: DC | PRN
  Administered 2017-09-15 (×3): 25 ug via INTRAVENOUS

## 2017-09-15 MED ORDER — PRENATAL MULTIVITAMIN CH: 1.0000 | ORAL_TABLET | Freq: Every day | ORAL | Status: DC

## 2017-09-15 MED ORDER — SENNOSIDES-DOCUSATE SODIUM 8.6-50 MG PO TABS: 2.0000 | ORAL_TABLET | ORAL | Status: DC

## 2017-09-15 MED ORDER — SENNOSIDES-DOCUSATE SODIUM 8.6-50 MG PO TABS
2.0000 | ORAL_TABLET | ORAL | Status: DC
  Administered 2017-09-15: 2 via ORAL
  Filled 2017-09-15: qty 2

## 2017-09-15 MED ORDER — IBUPROFEN 600 MG PO TABS
600.0000 mg | ORAL_TABLET | Freq: Four times a day (QID) | ORAL | Status: DC
  Administered 2017-09-15 – 2017-09-16 (×4): 600 mg via ORAL
  Filled 2017-09-15 (×4): qty 1

## 2017-09-15 MED ORDER — ACETAMINOPHEN 325 MG PO TABS: 650.0000 mg | ORAL_TABLET | ORAL | Status: DC | PRN

## 2017-09-15 MED ORDER — ONDANSETRON HCL 4 MG/2ML IJ SOLN
INTRAMUSCULAR | Status: DC | PRN
  Administered 2017-09-15: 4 mg via INTRAVENOUS

## 2017-09-15 MED ORDER — MIDAZOLAM HCL 2 MG/2ML IJ SOLN
INTRAMUSCULAR | Status: AC
  Filled 2017-09-15: qty 2

## 2017-09-15 SURGICAL SUPPLY — 22 items
BLADE SURG 11 STRL SS (BLADE) ×3 IMPLANT
CLIP FILSHIE TUBAL LIGA STRL (Clip) ×3 IMPLANT
DERMABOND ADHESIVE PROPEN (GAUZE/BANDAGES/DRESSINGS) ×2
DERMABOND ADVANCED .7 DNX6 (GAUZE/BANDAGES/DRESSINGS) ×1 IMPLANT
DRSG OPSITE POSTOP 3X4 (GAUZE/BANDAGES/DRESSINGS) ×3 IMPLANT
DURAPREP 26ML APPLICATOR (WOUND CARE) ×3 IMPLANT
GLOVE BIOGEL PI IND STRL 7.0 (GLOVE) ×1 IMPLANT
GLOVE BIOGEL PI IND STRL 7.5 (GLOVE) ×1 IMPLANT
GLOVE BIOGEL PI INDICATOR 7.0 (GLOVE) ×2
GLOVE BIOGEL PI INDICATOR 7.5 (GLOVE) ×2
GLOVE ECLIPSE 7.5 STRL STRAW (GLOVE) ×3 IMPLANT
GOWN STRL REUS W/TWL LRG LVL3 (GOWN DISPOSABLE) ×6 IMPLANT
NEEDLE HYPO 22GX1.5 SAFETY (NEEDLE) ×3 IMPLANT
NS IRRIG 1000ML POUR BTL (IV SOLUTION) ×3 IMPLANT
PACK ABDOMINAL MINOR (CUSTOM PROCEDURE TRAY) ×3 IMPLANT
PROTECTOR NERVE ULNAR (MISCELLANEOUS) ×3 IMPLANT
SPONGE LAP 4X18 X RAY DECT (DISPOSABLE) ×3 IMPLANT
SUT PLAIN 0 NONE (SUTURE) ×3 IMPLANT
SUT VICRYL 0 UR6 27IN ABS (SUTURE) ×3 IMPLANT
SUT VICRYL 4-0 PS2 18IN ABS (SUTURE) ×3 IMPLANT
SYR CONTROL 10ML LL (SYRINGE) ×3 IMPLANT
TOWEL OR 17X24 6PK STRL BLUE (TOWEL DISPOSABLE) ×6 IMPLANT

## 2017-09-15 NOTE — Anesthesia Postprocedure Evaluation (Signed)
Anesthesia Post Note  Patient: Linda KannerStacey Skinner  Procedure(s) Performed: POST PARTUM TUBAL LIGATION (N/A Abdomen)     Patient location during evaluation: PACU Anesthesia Type: Epidural Level of consciousness: awake Pain management: satisfactory to patient Vital Signs Assessment: post-procedure vital signs reviewed and stable Respiratory status: spontaneous breathing Cardiovascular status: blood pressure returned to baseline Postop Assessment: no headache and epidural receding Anesthetic complications: no    Last Vitals:  Vitals:   09/15/17 1530 09/15/17 1543  BP: 135/81 133/78  Pulse: 87 94  Resp: 16 16  Temp: 36.8 C 36.9 C  SpO2:      Last Pain:  Vitals:   09/15/17 1543  TempSrc: Oral  PainSc:    Pain Goal:                 Jiles GarterJACKSON,Ameka Krigbaum EDWARD

## 2017-09-15 NOTE — Telephone Encounter (Signed)
Tried to call pt to set up pp appointment but her vm has not been set up.  09-15-17  AS

## 2017-09-15 NOTE — Progress Notes (Signed)
Patient ID: Drusilla KannerStacey Lines, female   DOB: 07/03/1990, 27 y.o.   MRN: [id number]  Comfortable w/ epidural; Pit had been turned off at approx 0650 due to mild FHR variables  BP 127/75, other VSS FHR 140s, +accels, occ mi variables Ctx irreg 3-6 mins w/ Pit off Cx 5+/80/-3; AROM for copious mod/thick MSF  IUP@term  GDM Latent labor  Start Pit back up if ctx not closer after short obs time Anticipate SVD  Cam HaiSHAW, Vitalia Stough CNM 09/15/2017

## 2017-09-15 NOTE — Anesthesia Procedure Notes (Signed)
Epidural Patient location during procedure: OB Start time: 09/15/2017 2:48 AM End time: 09/15/2017 3:23 AM  Staffing Anesthesiologist: Jairo BenJackson, Halie Gass, MD Performed: anesthesiologist   Preanesthetic Checklist Completed: patient identified, surgical consent, pre-op evaluation, timeout performed, IV checked, risks and benefits discussed and monitors and equipment checked  Epidural Patient position: sitting Prep: site prepped and draped and DuraPrep Patient monitoring: blood pressure, continuous pulse ox and heart rate Approach: midline Location: L2-L3 Injection technique: LOR air  Needle:  Needle type: Tuohy  Needle gauge: 17 G Needle length: 9 cm Needle insertion depth: 7 cm Catheter type: closed end flexible Catheter size: 19 Gauge Catheter at skin depth: 14 cm Test dose: negative (1% lidocaine)  Assessment Events: blood not aspirated, injection not painful, no injection resistance, negative IV test and no paresthesia  Additional Notes Pt identified in Labor room.  Monitors applied. Working IV access confirmed. Sterile prep, drape lumbar spine.  1% lido local L 2,3.  #17ga Touhy LOR air at 7 cm L 2,3, cath in easily to 14 cm skin. Test dose OK, cath dosed and infusion begun.  Patient asymptomatic, VSS, no heme aspirated, tolerated well.  Sandford Craze Gaylin Bulthuis, MDReason for block:procedure for pain

## 2017-09-15 NOTE — Anesthesia Postprocedure Evaluation (Signed)
Anesthesia Post Note  Patient: Linda Skinner  Procedure(s) Performed: POST PARTUM TUBAL LIGATION (N/A Abdomen)     Patient location during evaluation: Mother Baby Anesthesia Type: Epidural Level of consciousness: awake and alert and oriented Pain management: satisfactory to patient Vital Signs Assessment: post-procedure vital signs reviewed and stable Respiratory status: respiratory function stable Cardiovascular status: stable Postop Assessment: no headache, no backache, epidural receding, patient able to bend at knees, no signs of nausea or vomiting and adequate PO intake Anesthetic complications: no    Last Vitals:  Vitals:   09/15/17 1543 09/15/17 1636  BP: 133/78 139/78  Pulse: 94 95  Resp: 16 20  Temp: 36.9 C 37.1 C  SpO2:      Last Pain:  Vitals:   09/15/17 1636  TempSrc: Oral  PainSc:    Pain Goal:                 Rody Keadle

## 2017-09-15 NOTE — Anesthesia Postprocedure Evaluation (Signed)
Anesthesia Post Note  Patient: Drusilla KannerStacey Wente  Procedure(s) Performed: AN AD HOC LABOR EPIDURAL     Patient location during evaluation: Mother Baby Anesthesia Type: Epidural Level of consciousness: awake and alert and oriented Pain management: satisfactory to patient Vital Signs Assessment: post-procedure vital signs reviewed and stable Respiratory status: respiratory function stable Cardiovascular status: stable Postop Assessment: no headache, no backache, epidural receding, patient able to bend at knees, no signs of nausea or vomiting and adequate PO intake Anesthetic complications: no    Last Vitals:  Vitals:   09/15/17 1543 09/15/17 1636  BP: 133/78 139/78  Pulse: 94 95  Resp: 16 20  Temp: 36.9 C 37.1 C  SpO2:      Last Pain:  Vitals:   09/15/17 1636  TempSrc: Oral  PainSc:    Pain Goal:                 Druanne Bosques

## 2017-09-15 NOTE — Transfer of Care (Addendum)
Immediate Anesthesia Transfer of Care Note  Patient: Linda KannerStacey Skinner  Procedure(s) Performed: POST PARTUM TUBAL LIGATION (N/A Abdomen)  Patient Location: PACU  Anesthesia Type:Epidural  Level of Consciousness: awake, alert  and oriented  Airway & Oxygen Therapy: Patient Spontanous Breathing  Post-op Assessment: Report given to RN and Post -op Vital signs reviewed and stable  Post vital signs: Reviewed and stable  Last Vitals:  Vitals Value Taken Time  BP 123/66 09/15/2017  2:25 PM  Temp    Pulse 94 09/15/2017  2:27 PM  Resp    SpO2 100 % 09/15/2017  2:27 PM  Vitals shown include unvalidated device data.  Last Pain:  Vitals:   09/15/17 1230  TempSrc: Oral  PainSc:          Complications: No apparent anesthesia complications

## 2017-09-15 NOTE — Progress Notes (Signed)
Subjective: Has improved pain relief with epidural.   Objective: BP 128/81   Pulse 91   Temp 98.4 F (36.9 C) (Oral)   Resp 16   LMP 12/15/2016 (Approximate)   SpO2 97%  No intake/output data recorded. No intake/output data recorded.  FHT:  FHR: 150 bpm, variability: moderate,  accelerations:  Abscent,  decelerations:  Present variable decels UC:   regular, every 2-3 minutes minutes SVE:   Dilation: 5.5 Effacement (%): 70 Station: -2 Exam by:: Barrie DunkerMurayyah Johnson RN  Labs: Lab Results  Component Value Date   WBC 5.1 09/14/2017   HGB 11.4 (L) 09/14/2017   HCT 34.0 (L) 09/14/2017   MCV 83.5 09/14/2017   PLT 184 09/14/2017    Assessment / Plan: Induction of labor due to gestational diabetes,  progressing well on pitocin  Labor: due to variable decels will step back pitocin. Immediate improvement after going from 10->8. will need to titrate up when baby tolerates Preeclampsia:  N/A Fetal Wellbeing:  Category II Pain Control:  Epidural I/D:  n/a Anticipated MOD:  NSVD  Myrene BuddyJacob Issabella Rix 09/15/2017, 6:23 AM

## 2017-09-15 NOTE — Progress Notes (Signed)
Subjective: Doing well, states that she is getting more uncomfortable from the contractions.    Objective: BP (!) 149/66   Pulse 98   Temp 98.1 F (36.7 C) (Oral)   Resp 16   LMP 12/15/2016 (Approximate)  No intake/output data recorded. No intake/output data recorded.  FHT:  FHR: 130 bpm, variability: moderate,  accelerations:  Abscent,  decelerations:  Absent UC:   irregular, every 4-5 minutes SVE:   Dilation: 4.5 Effacement (%): 50 Station: -3 Exam by:: Barrie DunkerMurayyah Johnson RN  Labs: Lab Results  Component Value Date   WBC 5.1 09/14/2017   HGB 11.4 (L) 09/14/2017   HCT 34.0 (L) 09/14/2017   MCV 83.5 09/14/2017   PLT 184 09/14/2017    Assessment / Plan: Induction of labor due to gestational diabetes,  progressing well on pitocin  Labor:s/p foley bulb, on pitocin 2x2 Preeclampsia:  N/a Fetal Wellbeing:  Category I Pain Control:  Requests epidural when appopriate I/D:  PCN for gbs ppx Anticipated MOD:  NSVD     Myrene BuddyJacob Gwyn Hieronymus 09/15/2017, 1:06 AM

## 2017-09-15 NOTE — Progress Notes (Signed)
Patient desires permanent sterilization.  Other reversible forms of contraception were discussed with patient; she declines all other modalities. Risks of procedure discussed with patient including but not limited to: risk of regret, permanence of method, bleeding, infection, injury to surrounding organs and need for additional procedures.  Failure risk of 1-2 % with increased risk of ectopic gestation if pregnancy occurs was also discussed with patient.  Patient verbalized understanding of these risks and wants to proceed with sterilization.  Written informed consent obtained.  To OR when ready.  

## 2017-09-15 NOTE — Anesthesia Preprocedure Evaluation (Signed)
Anesthesia Evaluation  Patient identified by MRN, date of birth, ID band Patient awake    Reviewed: Allergy & Precautions, NPO status , Patient's Chart, lab work & pertinent test results  History of Anesthesia Complications Negative for: history of anesthetic complications  Airway Mallampati: IV  TM Distance: >3 FB Neck ROM: Full    Dental  (+) Dental Advisory Given   Pulmonary former smoker,    breath sounds clear to auscultation       Cardiovascular negative cardio ROS   Rhythm:Regular Rate:Normal     Neuro/Psych negative neurological ROS     GI/Hepatic Neg liver ROS, GERD  Medicated and Controlled,  Endo/Other  diabetes, Oral Hypoglycemic AgentsMorbid obesity  Renal/GU negative Renal ROS     Musculoskeletal   Abdominal (+) + obese,   Peds  Hematology Hb 11.4, plt 184k   Anesthesia Other Findings   Reproductive/Obstetrics (+) Pregnancy                             Anesthesia Physical  Anesthesia Plan  ASA: III  Anesthesia Plan: Epidural   Post-op Pain Management:    Induction:   PONV Risk Score and Plan: Treatment may vary due to age or medical condition  Airway Management Planned: Natural Airway  Additional Equipment:   Intra-op Plan:   Post-operative Plan:   Informed Consent: I have reviewed the patients History and Physical, chart, labs and discussed the procedure including the risks, benefits and alternatives for the proposed anesthesia with the patient or authorized representative who has indicated his/her understanding and acceptance.   Dental advisory given  Plan Discussed with:   Anesthesia Plan Comments: (Patient identified. Risks/Benefits/Options discussed with patient including but not limited to bleeding, infection, nerve damage, paralysis, failed block, incomplete pain control, headache, blood pressure changes, nausea, vomiting, reactions to medication  both or allergic, itching and postpartum back pain. Confirmed with bedside nurse the patient's most recent platelet count. Confirmed with patient that they are not currently taking any anticoagulation, have any bleeding history or any family history of bleeding disorders. Patient expressed understanding and wished to proceed. All questions were answered. )        Anesthesia Quick Evaluation

## 2017-09-15 NOTE — Op Note (Signed)
Linda Skinner 09/15/2017  PREOPERATIVE DIAGNOSIS:  Undesired fertility  POSTOPERATIVE DIAGNOSIS:  Undesired fertility  PROCEDURE:  Postpartum Bilateral Tubal Sterilization using Filshie Clips   SURGEON: Surgeon(s) and Role:    * Wouk, Wilfred CurtisNoah Bedford, MD - Primary    * Degele, Kandra NicolasJulie P, MD - Fellow  ANESTHESIA:  Epidural  COMPLICATIONS:  None immediate.  ESTIMATED BLOOD LOSS:  20 mL  FLUIDS: 1,000 cc LR.  INDICATIONS: 27 y.o. yo [id number]  with undesired fertility,status post vaginal delivery, desires permanent sterilization. Risks and benefits of procedure discussed with patient including permanence of method, bleeding, infection, injury to surrounding organs and need for additional procedures. Risk failure of 0.5-1% with increased risk of ectopic gestation if pregnancy occurs was also discussed with patient.   FINDINGS:  Normal uterus, tubes, and ovaries.  TECHNIQUE:  The patient was taken to the operating room where her epidural anesthesia was dosed up to surgical level and found to be adequate.  She was then placed in the dorsal supine position and prepped and draped in sterile fashion.  After an adequate timeout was performed, attention was turned to the patient's abdomen where a small transverse skin incision was made under the umbilical fold. The incision was taken down to the layer of fascia using the scalpel, and fascia was incised, and extended bilaterally using Mayo scissors. The peritoneum was entered in a sharp fashion. Attention was then turned to the patient's uterus, and left fallopian tube was identified and followed out to the fimbriated end.  A Filshie clip was placed on the left fallopian tube about 2 cm from the cornual attachment, with care given to incorporate the underlying mesosalpinx.  A similar process was carried out on the rightl side allowing for bilateral tubal sterilization.  Good hemostasis was noted overall.  Local analgesia was drizzled on both operative sites.The  instruments were then removed from the patient's abdomen and the fascial incision was repaired with 0 Vicryl, and the skin was closed with a 4-0 Vycril subcuticular stitch. The patient tolerated the procedure well.  Sponge, lap, and needle counts were correct times two.  The patient was then taken to the recovery room awake, extubated and in stable condition.   Degele, Kandra NicolasJulie P, MD OB Fellow 09/15/2017 2:27 PM

## 2017-09-15 NOTE — Anesthesia Pain Management Evaluation Note (Signed)
  CRNA Pain Management Visit Note  Patient: Linda Skinner, 27 y.o., female  "Hello [facility] am [facility]. We have an anesthesia team available at all times to provide care throughout the hospital, including epidural management and anesthesia for C-section. I don't know your plan for the delivery whether it a natural birth, water birth, IV sedation, nitrous supplementation, doula or epidural, but we want to meet your pain goals."   1.Was your pain managed to your expectations on prior hospitalizations?   Yes   2.What is your expectation for pain management during this hospitalization?     Epidural  3.How can we help you reach that goal? Maintain epidural.  Record the patient's initial score and the patient's pain goal.   Pain: 0  Pain Goal: 0 [facility] you to be able to say your pain was always managed very well.  Yossef Gilkison 09/15/2017

## 2017-09-15 NOTE — Progress Notes (Signed)
Subjective: Patient ID: Linda Skinner, female   DOB: 11/18/1990, 27 y.o.   MRN: [id number]  Patient began to feel increasing pain that has resolved with redosing; Pit had been turned back on 9:50.  Objective: BP (!) 122/54   Pulse 92   Temp 98.5 F (36.9 C) (Oral)   Resp 16   LMP 12/15/2016 (Approximate)   SpO2 97%  No intake/output data recorded. Total I/O In: -  Out: 500 [Urine:500]  FHT:  FHR: 150 bpm, variability: moderate,  accelerations:  Abscent,  decelerations:  Absent and   UC:   regular, every 2 minutes SVE:   Dilation: 7.5 Effacement (%): 90 Station: -1 Exam by:: [place], rn  Labs: Lab Results  Component Value Date   WBC 5.1 09/14/2017   HGB 11.4 (L) 09/14/2017   HCT 34.0 (L) 09/14/2017   MCV 83.5 09/14/2017   PLT 184 09/14/2017    Assessment / Plan: Induction of labor due to gestational diabetes,  progressing well on pitocin  Labor: Progressing on Pitocin, will continue to increase then AROM Fetal Wellbeing:  Category I Pain Control:  Epidural I/D:  GBS positive Penicillin  Anticipated MOD:  NSVD  Linda Skinner 09/15/2017, 9:52 AM

## 2017-09-15 NOTE — Addendum Note (Signed)
Addendum  created 09/15/17 1834 by Graciela HusbandsFussell, Wade Asebedo O, CRNA   Sign clinical note

## 2017-09-15 NOTE — Anesthesia Preprocedure Evaluation (Addendum)
Anesthesia Evaluation  Patient identified by MRN, date of birth, ID band Patient awake    Reviewed: Allergy & Precautions, NPO status , Patient's Chart, lab work & pertinent test results  History of Anesthesia Complications Negative for: history of anesthetic complications  Airway Mallampati: IV  TM Distance: >3 FB Neck ROM: Full    Dental  (+) Dental Advisory Given   Pulmonary former smoker,    breath sounds clear to auscultation       Cardiovascular negative cardio ROS   Rhythm:Regular Rate:Normal     Neuro/Psych negative neurological ROS     GI/Hepatic Neg liver ROS, GERD  Medicated and Controlled,  Endo/Other  diabetes (glu 92), Oral Hypoglycemic AgentsMorbid obesity  Renal/GU negative Renal ROS     Musculoskeletal   Abdominal (+) + obese,   Peds  Hematology Hb 11.4, plt 184k   Anesthesia Other Findings   Reproductive/Obstetrics (+) Pregnancy                            Anesthesia Physical Anesthesia Plan  ASA: III  Anesthesia Plan: Epidural   Post-op Pain Management:    Induction:   PONV Risk Score and Plan: Treatment may vary due to age or medical condition  Airway Management Planned: Natural Airway  Additional Equipment:   Intra-op Plan:   Post-operative Plan:   Informed Consent: I have reviewed the patients History and Physical, chart, labs and discussed the procedure including the risks, benefits and alternatives for the proposed anesthesia with the patient or authorized representative who has indicated his/her understanding and acceptance.   Dental advisory given  Plan Discussed with:   Anesthesia Plan Comments: (Patient identified. Risks/Benefits/Options discussed with patient including but not limited to bleeding, infection, nerve damage, paralysis, failed block, incomplete pain control, headache, blood pressure changes, nausea, vomiting, reactions to  medication both or allergic, itching and postpartum back pain. Confirmed with bedside nurse the patient's most recent platelet count. Confirmed with patient that they are not currently taking any anticoagulation, have any bleeding history or any family history of bleeding disorders. Patient expressed understanding and wished to proceed. All questions were answered. )       Anesthesia Quick Evaluation

## 2017-09-16 ENCOUNTER — Telehealth: Payer: Self-pay | Admitting: *Deleted

## 2017-09-16 ENCOUNTER — Encounter (HOSPITAL_COMMUNITY): Payer: Self-pay | Admitting: Obstetrics and Gynecology

## 2017-09-16 DIAGNOSIS — Z302 Encounter for sterilization: Secondary | ICD-10-CM

## 2017-09-16 HISTORY — DX: Encounter for sterilization: Z30.2

## 2017-09-16 MED ORDER — DOCUSATE SODIUM 100 MG PO CAPS: 100.0000 mg | ORAL_CAPSULE | Freq: Two times a day (BID) | ORAL | 2 refills | Status: DC | PRN

## 2017-09-16 MED ORDER — IBUPROFEN 600 MG PO TABS: 600.0000 mg | ORAL_TABLET | Freq: Four times a day (QID) | ORAL | 2 refills | Status: DC | PRN

## 2017-09-16 MED ORDER — OXYCODONE-ACETAMINOPHEN 5-325 MG PO TABS: 1.0000 | ORAL_TABLET | ORAL | 0 refills | Status: DC | PRN

## 2017-09-16 NOTE — Discharge Instructions (Signed)
Vaginal Delivery, Care After °Refer to this sheet in the next few weeks. These instructions provide you with information about caring for yourself after vaginal delivery. Your health care provider may also give you more specific instructions. Your treatment has been planned according to current medical practices, but problems sometimes occur. Call your health care provider if you have any problems or questions. °What can I expect after the procedure? °After vaginal delivery, it is common to have: °· Some bleeding from your vagina. °· Soreness in your abdomen, your vagina, and the area of skin between your vaginal opening and your anus (perineum). °· Pelvic cramps. °· Fatigue. ° °Follow these instructions at home: °Medicines °· Take over-the-counter and prescription medicines only as told by your health care provider. °· If you were prescribed an antibiotic medicine, take it as told by your health care provider. Do not stop taking the antibiotic until it is finished. °Driving ° °· Do not drive or operate heavy machinery while taking prescription pain medicine. °· Do not drive for 24 hours if you received a sedative. °Lifestyle °· Do not drink alcohol. This is especially important if you are breastfeeding or taking medicine to relieve pain. °· Do not use tobacco products, including cigarettes, chewing tobacco, or e-cigarettes. If you need help quitting, ask your health care provider. °Eating and drinking °· Drink at least 8 eight-ounce glasses of water every day unless you are told not to by your health care provider. If you choose to breastfeed your baby, you may need to drink more water than this. °· Eat high-fiber foods every day. These foods may help prevent or relieve constipation. High-fiber foods include: °? Whole grain cereals and breads. °? Brown rice. °? Beans. °? Fresh fruits and vegetables. °Activity °· Return to your normal activities as told by your health care provider. Ask your health care provider  what activities are safe for you. °· Rest as much as possible. Try to rest or take a nap when your baby is sleeping. °· Do not lift anything that is heavier than your baby or 10 lb (4.5 kg) until your health care provider says that it is safe. °· Talk with your health care provider about when you can engage in sexual activity. This may depend on your: °? Risk of infection. °? Rate of healing. °? Comfort and desire to engage in sexual activity. °Vaginal Care °· If you have an episiotomy or a vaginal tear, check the area every day for signs of infection. Check for: °? More redness, swelling, or pain. °? More fluid or blood. °? Warmth. °? Pus or a bad smell. °· Do not use tampons or douches until your health care provider says this is safe. °· Watch for any blood clots that may pass from your vagina. These may look like clumps of dark red, brown, or black discharge. °General instructions °· Keep your perineum clean and dry as told by your health care provider. °· Wear loose, comfortable clothing. °· Wipe from front to back when you use the toilet. °· Ask your health care provider if you can shower or take a bath. If you had an episiotomy or a perineal tear during labor and delivery, your health care provider may tell you not to take baths for a certain length of time. °· Wear a bra that supports your breasts and fits you well. °· If possible, have someone help you with household activities and help care for your baby for at least a few days after   you leave the hospital. °· Keep all follow-up visits for you and your baby as told by your health care provider. This is important. °Contact a health care provider if: °· You have: °? Vaginal discharge that has a bad smell. °? Difficulty urinating. °? Pain when urinating. °? A sudden increase or decrease in the frequency of your bowel movements. °? More redness, swelling, or pain around your episiotomy or vaginal tear. °? More fluid or blood coming from your episiotomy or  vaginal tear. °? Pus or a bad smell coming from your episiotomy or vaginal tear. °? A fever. °? A rash. °? Little or no interest in activities you used to enjoy. °? Questions about caring for yourself or your baby. °· Your episiotomy or vaginal tear feels warm to the touch. °· Your episiotomy or vaginal tear is separating or does not appear to be healing. °· Your breasts are painful, hard, or turn red. °· You feel unusually sad or worried. °· You feel nauseous or you vomit. °· You pass large blood clots from your vagina. If you pass a blood clot from your vagina, save it to show to your health care provider. Do not flush blood clots down the toilet without having your health care provider look at them. °· You urinate more than usual. °· You are dizzy or light-headed. °· You have not breastfed at all and you have not had a menstrual period for 12 weeks after delivery. °· You have stopped breastfeeding and you have not had a menstrual period for 12 weeks after you stopped breastfeeding. °Get help right away if: °· You have: °? Pain that does not go away or does not get better with medicine. °? Chest pain. °? Difficulty breathing. °? Blurred vision or spots in your vision. °? Thoughts about hurting yourself or your baby. °· You develop pain in your abdomen or in one of your legs. °· You develop a severe headache. °· You faint. °· You bleed from your vagina so much that you fill two sanitary pads in one hour. °This information is not intended to replace advice given to you by your health care provider. Make sure you discuss any questions you have with your health care provider. °Document Released: 05/15/2000 Document Revised: 10/30/2015 Document Reviewed: 06/02/2015 °Elsevier Interactive Patient Education © 2018 [employer] ° ° °Postpartum Tubal Ligation, Care After °Refer to this sheet in the next few weeks. These instructions provide you with information about caring for yourself after your procedure. Your health  care provider may also give you more specific instructions. Your treatment has been planned according to current medical practices, but problems sometimes occur. Call your health care provider if you have any problems or questions after your procedure. °What can I expect after the procedure? °After the procedure, it is common to have: °· A sore throat. °· Bruising or pain in your back. °· Nausea or vomiting. °· Dizziness. °· Mild abdominal discomfort or pain, such as cramping, gas pain, or feeling bloated. °· Soreness where the incision was made. °· Tiredness. °· Pain in your shoulders. ° °Follow these instructions at home: °Medicines °· Take over-the-counter and prescription medicines only as told by your health care provider. °· Do not take aspirin because it can cause bleeding. °· Do not drive or operate heavy machinery while taking prescription pain medicine. °Activity °· Rest for the rest of the day. °· Gradually return to your normal activities over the next few days. °· Do not have sex, douche,   or put a tampon or anything else in your vagina for 6 weeks or as long as told by your health care provider. °· Do not lift anything that is heavier than your baby for 2 weeks or as long as told by your health care provider. °Incision care °· Follow instructions from your health care provider about how to take care of your incision. Make sure you: °? Wash your hands with soap and water before you change your bandage (dressing). If soap and water are not available, use hand sanitizer. °? Change your dressing as told by your health care provider. °? Leave stitches (sutures) in place. They may need to stay in place for 2 weeks or longer. °· Check your incision area every day for signs of infection. Check for: °? More redness, swelling, or pain. °? More fluid or blood. °? Warmth. °? Pus or a bad smell. °Other Instructions °· Do not take baths, swim, or use a hot tub until your health care provider approves. You may take  showers. °· Keep all follow-up visits as told by your health care provider. This is important. °Contact a health care provider if: °· You have more redness, swelling, or pain around your incision. °· Your incision feels warm to the touch. °· You have pus or a bad smell coming from your incision. °· The edges of your incision break open after the sutures have been removed. °· Your pain does not improve after 2-3 days. °· You have a rash. °· You repeatedly become dizzy or lightheaded. °· Your pain medicine is not helping. °· You are constipated. °Get help right away if: °· You have a fever. °· You faint. °· You have pain in your abdomen that gets worse. °· You have fluid or blood coming from your sutures. °· You have shortness of breath or difficulty breathing. °· You have chest pain or leg pain. °· You have ongoing nausea or diarrhea. °This information is not intended to replace advice given to you by your health care provider. Make sure you discuss any questions you have with your health care provider. °Document Released: 11/17/2011 Document Revised: 10/21/2015 Document Reviewed: 04/28/2015 °Elsevier Interactive Patient Education © 2018 [employer] ° °

## 2017-09-16 NOTE — Discharge Summary (Signed)
OB Discharge Summary     Patient Name: Linda Skinner DOB: 09/23/1990 MRN: [id number]  Date of admission: 09/14/2017 Delivering MD: Frederik PearEGELE, JULIE P   Date of discharge: 09/16/2017  Admitting diagnosis: INDUCTION ems Intrauterine pregnancy: 831w1d     Secondary diagnosis:  Principal Problem:   SVD (spontaneous vaginal delivery) Active Problems:   Gestational diabetes mellitus, class A2   Encounter for tubal ligation  Additional problems: N/A     Discharge diagnosis: Term Pregnancy Delivered and GDM A2                                                                                                Post partum procedures:none  Augmentation: AROM, Pitocin and Foley Balloon  Complications: None  Hospital course:  Induction of Labor With Vaginal Delivery   27 y.o. yo [id number] at 331w1d was admitted to the hospital 09/14/2017 for induction of labor.  Indication for induction: A2 DM.  Patient had an uncomplicated labor course as follows: Membrane Rupture Time/Date: 7:32 AM ,09/15/2017   Intrapartum Procedures: Episiotomy: None [1]                                         Lacerations:  None [1]  Patient had delivery of a Viable infant.   Patient underwent Bilateral Tubal Sterilization postpartum without any complications.  Information for the patient's newborn:  Linda Skinner, Boy Laquanta [[id number]][[id number]]      09/15/2017  Details of delivery can be found in separate delivery note.  Patient had a routine postpartum course. Patient is discharged home 09/16/17.  Physical exam  Vitals:   09/15/17 2024 09/15/17 2025 09/16/17 0431 09/16/17 0857  BP: 110/70  112/70 (!) 143/81  Pulse: 100  85 92  Resp: 18  18 18   Temp: 99.7 F (37.6 C)  98.7 F (37.1 C) 98.2 F (36.8 C)  TempSrc: Oral  Oral Oral  SpO2: 97% 97% 96%    General: alert and cooperative Lochia: appropriate Uterine Fundus: firm Incision: Dressing clean, dry, and intact  DVT Evaluation: No evidence of DVT seen on physical  exam. Labs: Lab Results  Component Value Date   WBC 5.1 09/14/2017   HGB 11.4 (L) 09/14/2017   HCT 34.0 (L) 09/14/2017   MCV 83.5 09/14/2017   PLT 184 09/14/2017   CMP Latest Ref Rng & Units 02/22/2017  Glucose 65 - 99 mg/dL 96  BUN 6 - 20 mg/dL 11  Creatinine 1.300.44 - 8.651.00 mg/dL 7.840.60  Sodium 696135 - 295145 mmol/L 136  Potassium 3.5 - 5.1 mmol/L 3.8  Chloride 101 - 111 mmol/L 102  CO2 22 - 32 mmol/L 22  Calcium 8.9 - 10.3 mg/dL 9.5  Total Protein 6.5 - 8.1 g/dL 8.2(H)  Total Bilirubin 0.3 - 1.2 mg/dL 0.5  Alkaline Phos 38 - 126 U/L 38  AST 15 - 41 U/L 19  ALT 14 - 54 U/L 15    Discharge instruction: per After Visit Summary and "Baby and Me Booklet".  After visit meds:  Allergies as of 09/16/2017   No Known Allergies     Medication List    STOP taking these medications   ferrous sulfate 325 (65 FE) MG tablet   imiquimod 5 % cream Commonly known as:  ALDARA   metFORMIN 500 MG tablet Commonly known as:  GLUCOPHAGE   omeprazole 20 MG capsule Commonly known as:  PRILOSEC   prenatal vitamin w/FE, FA 27-1 MG Tabs tablet     TAKE these medications   docusate sodium 100 MG capsule Commonly known as:  COLACE Take 1 capsule (100 mg total) by mouth 2 (two) times daily as needed for mild constipation or moderate constipation.   ibuprofen 600 MG tablet Commonly known as:  ADVIL,MOTRIN Take 1 tablet (600 mg total) by mouth every 6 (six) hours as needed for moderate pain or cramping.   oxyCODONE-acetaminophen 5-325 MG tablet Commonly known as:  PERCOCET/ROXICET Take 1 tablet by mouth every 4 (four) hours as needed for severe pain.       Diet: routine diet  Activity: Advance as tolerated. Pelvic rest for 6 weeks.   Outpatient follow up: 2 and 6 weeks for incision and PP visit+2 hr GTT  Postpartum contraception: Tubal Ligation  Newborn Data: Live born female  Birth Weight: 8 lb 13.8 oz (4020 g) APGAR: 9, 9  Newborn Delivery   Birth date/time:  09/15/2017  11:44:00 Delivery type:  Vaginal, Spontaneous    Baby Feeding: Bottle Disposition:home with mother   09/16/2017 Jaynie Collins, MD

## 2017-09-16 NOTE — Telephone Encounter (Signed)
Tried to call pt again to set up pp appointment but vm not set up.  09-16-17  AS

## 2017-09-27 ENCOUNTER — Ambulatory Visit (INDEPENDENT_AMBULATORY_CARE_PROVIDER_SITE_OTHER): Payer: Medicaid Other | Admitting: Obstetrics & Gynecology

## 2017-09-27 ENCOUNTER — Other Ambulatory Visit: Payer: Self-pay

## 2017-09-27 ENCOUNTER — Encounter: Payer: Self-pay | Admitting: Obstetrics & Gynecology

## 2017-09-27 VITALS — BP 148/90 | HR 89 | Ht 64.0 in | Wt 266.0 lb

## 2017-09-27 DIAGNOSIS — Z9889 Other specified postprocedural states: Secondary | ICD-10-CM

## 2017-09-27 NOTE — Progress Notes (Signed)
  HPI: Patient returns for routine postoperative follow-up having undergone pp btl on 4/17.  The patient's immediate postoperative recovery has been unremarkable. Since hospital discharge the patient reports no problems.   Current Outpatient Medications: docusate sodium (COLACE) 100 MG capsule, Take 1 capsule (100 mg total) by mouth 2 (two) times daily as needed for mild constipation or moderate constipation., Disp: 30 capsule, Rfl: 2 ibuprofen (ADVIL,MOTRIN) 600 MG tablet, Take 1 tablet (600 mg total) by mouth every 6 (six) hours as needed for moderate pain or cramping., Disp: 30 tablet, Rfl: 2 oxyCODONE-acetaminophen (PERCOCET/ROXICET) 5-325 MG tablet, Take 1 tablet by mouth every 4 (four) hours as needed for severe pain. (Patient not taking: Reported on 09/27/2017), Disp: 15 tablet, Rfl: 0  No current facility-administered medications for this visit.     Blood pressure (!) 148/90, pulse 89, height  (1.626 m), weight 266 lb (120.7 kg), unknown if currently breastfeeding.  Physical Exam: Incision clean dry intact  Diagnostic Tests:   Pathology: none  Impression: S/p PP BTL with filshie clips  Plan:   Follow up: 4  weeks  Lazaro Arms, MD

## 2017-10-20 ENCOUNTER — Ambulatory Visit (INDEPENDENT_AMBULATORY_CARE_PROVIDER_SITE_OTHER): Payer: Medicaid Other | Admitting: Advanced Practice Midwife

## 2017-10-20 ENCOUNTER — Encounter: Payer: Self-pay | Admitting: Advanced Practice Midwife

## 2017-10-20 ENCOUNTER — Other Ambulatory Visit: Payer: Medicaid Other

## 2017-10-20 DIAGNOSIS — Z131 Encounter for screening for diabetes mellitus: Secondary | ICD-10-CM

## 2017-10-20 DIAGNOSIS — O24419 Gestational diabetes mellitus in pregnancy, unspecified control: Secondary | ICD-10-CM

## 2017-10-20 NOTE — Progress Notes (Signed)
Linda Skinner is a 27 y.o. who presents for a postpartum visit. She is 5 weeks postpartum following a spontaneous vaginal delivery. I have fully reviewed the prenatal and intrapartum course. The delivery was at 39.1 gestational weeks. IOL for A2DM/NR NST. Anesthesia: epidural. Postpartum course has been uneventful. Baby's course has been uneventful. Baby is feeding by bottle. Bleeding: no bleeding. Bowel function is normal. Bladder function is normal. Patient is not sexually active. Contraception method is tubal ligation. Postpartum depression screening: negative.   Current Outpatient Medications:  .  docusate sodium (COLACE) 100 MG capsule, Take 1 capsule (100 mg total) by mouth 2 (two) times daily as needed for mild constipation or moderate constipation. (Patient not taking: Reported on 10/20/2017), Disp: 30 capsule, Rfl: 2 .  ibuprofen (ADVIL,MOTRIN) 600 MG tablet, Take 1 tablet (600 mg total) by mouth every 6 (six) hours as needed for moderate pain or cramping. (Patient not taking: Reported on 10/20/2017), Disp: 30 tablet, Rfl: 2 .  oxyCODONE-acetaminophen (PERCOCET/ROXICET) 5-325 MG tablet, Take 1 tablet by mouth every 4 (four) hours as needed for severe pain. (Patient not taking: Reported on 09/27/2017), Disp: 15 tablet, Rfl: 0  Review of Systems   Constitutional: Negative for fever and chills Eyes: Negative for visual disturbances Respiratory: Negative for shortness of breath, dyspnea Cardiovascular: Negative for chest pain or palpitations  Gastrointestinal: Negative for vomiting, diarrhea and constipation Genitourinary: Negative for dysuria and urgency Musculoskeletal: Negative for back pain, joint pain, myalgias  Neurological: Negative for dizziness and headaches    Objective:     Vitals:   10/20/17 0945  BP: 108/70  Pulse: 98   General:  alert, cooperative and no distress   Breasts:  negative  Lungs: Normal respiratory effort  Heart:  regular rate and rhythm  Abdomen: Soft,  nontender   Vulva:  normal  Vagina: normal vagina  Cervix:  closed  Corpus: Well involuted     Rectal Exam: no hemorrhoids        Assessment:    normal postpartum exam.  Plan:   1. Contraception: tubal ligation 2. Follow up in:   or as needed.  Doing 2hr GTT today

## 2017-10-21 LAB — GLUCOSE TOLERANCE, 2 HOURS W/ 1HR
GLUCOSE, 1 HOUR: 95 mg/dL (ref 65–179)
GLUCOSE, FASTING: 85 mg/dL (ref 65–91)
Glucose, 2 hour: 104 mg/dL (ref 65–152)

## 2017-11-06 IMAGING — US ULTRASOUND LEFT BREAST LIMITED
1 series · 13 of 13 positions shown · non-contrast
Comparison: None.

CLINICAL DATA: Palpable left medial breast mass, felt by the
patient.

EXAM:
ULTRASOUND OF THE LEFT BREAST

[Series 1: ultrasound left breast limited · 0.06mm/px · 13 of 13 slices shown]
[im 1/13]
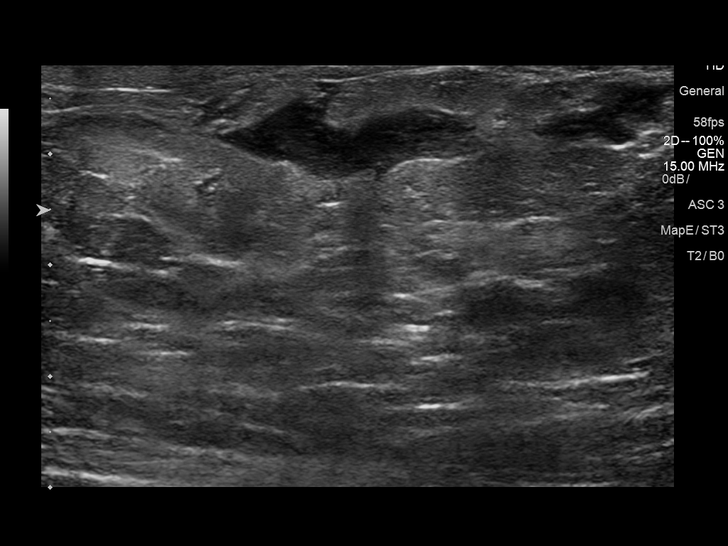
[im 2/13]
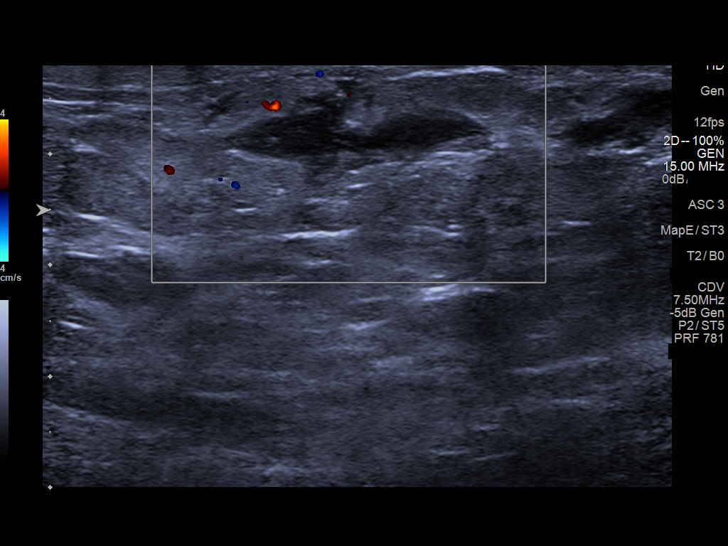
[im 3/13]
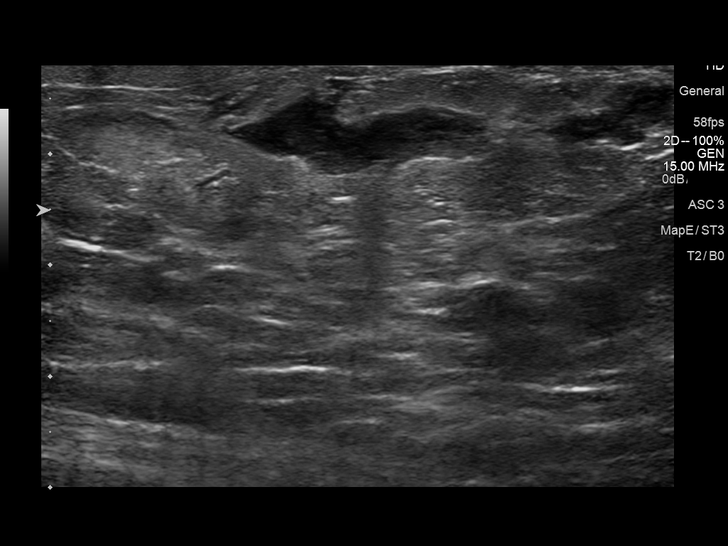
[im 4/13]
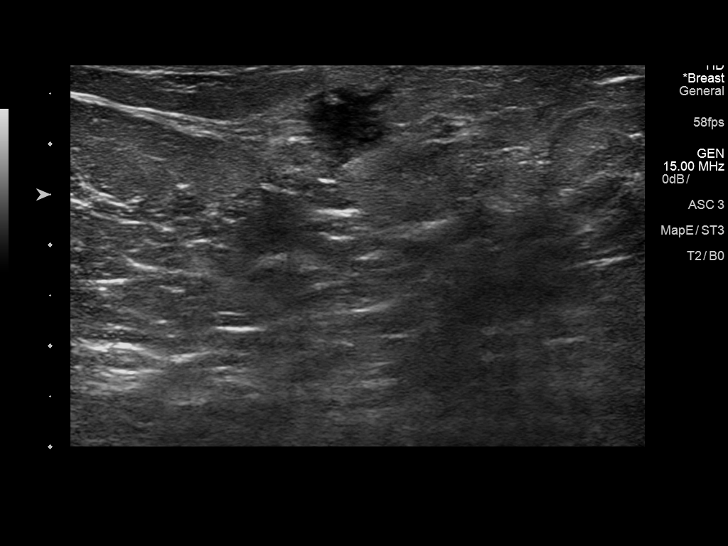
[im 5/13]
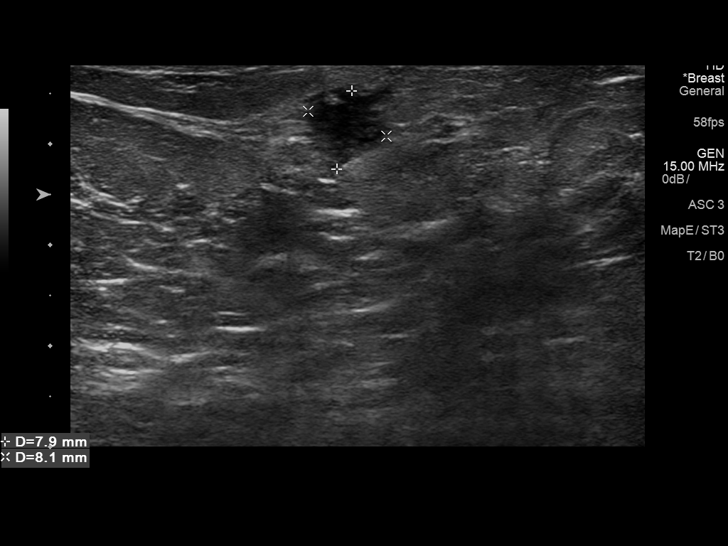
[im 6/13]
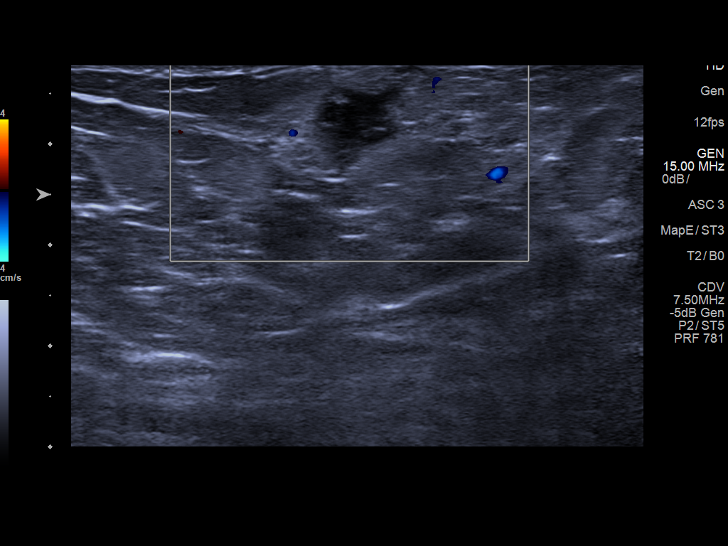
[im 7/13]
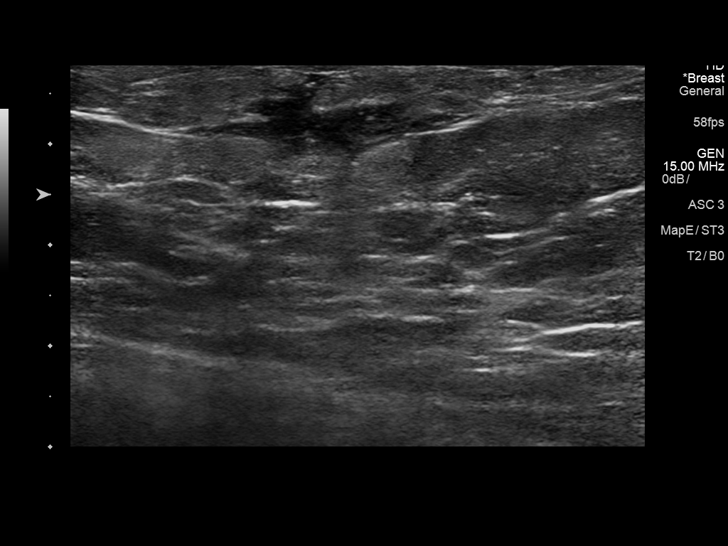
[im 8/13]
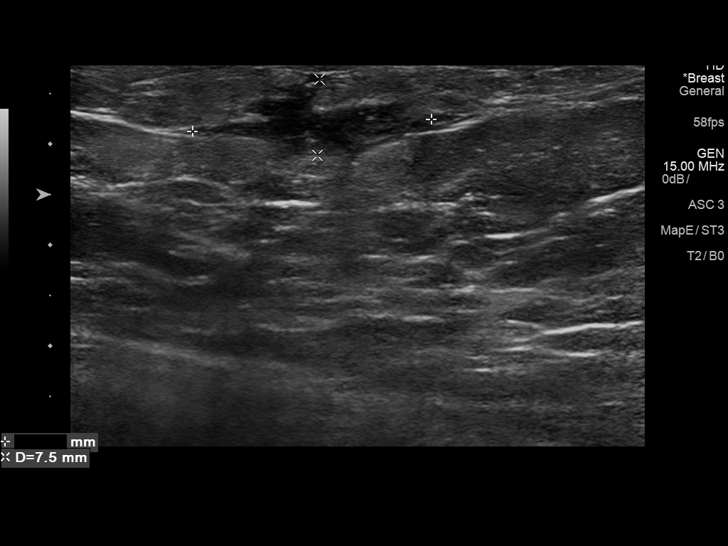
[im 9/13]
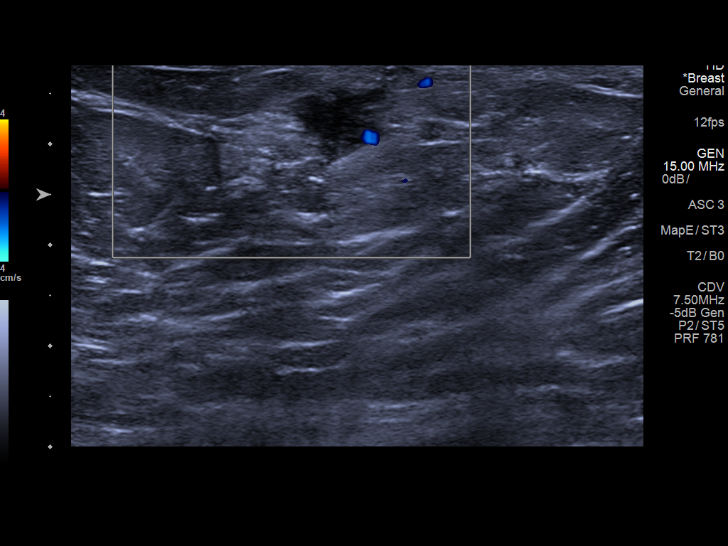
[im 10/13]
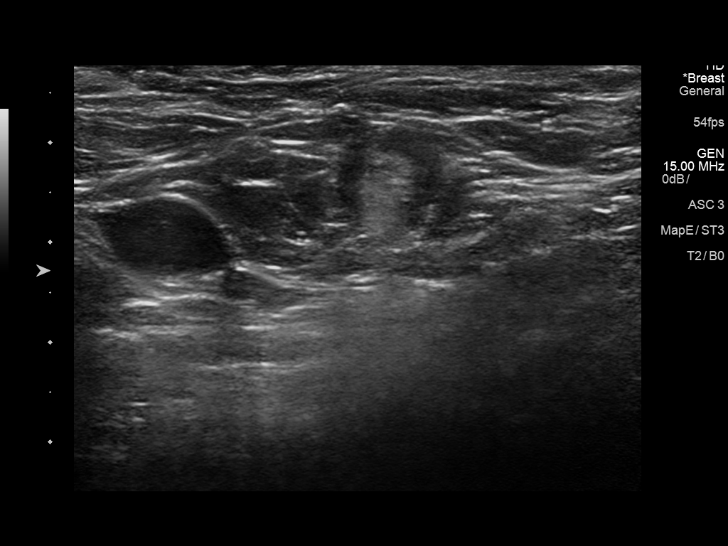
[im 11/13]
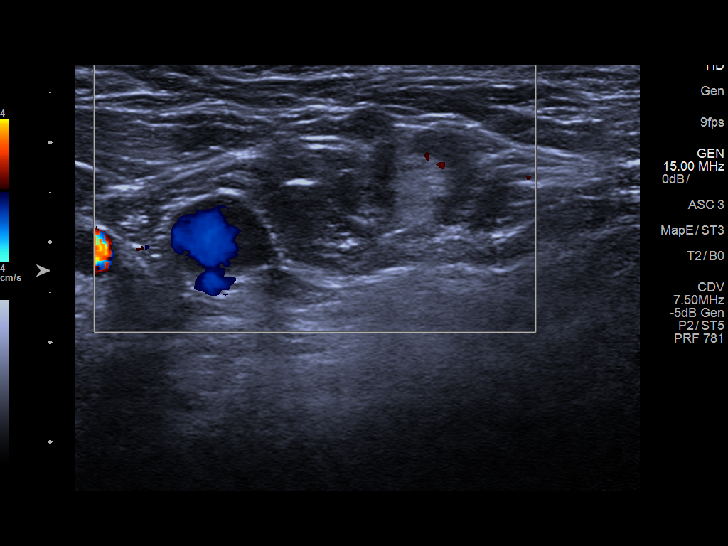
[im 12/13]
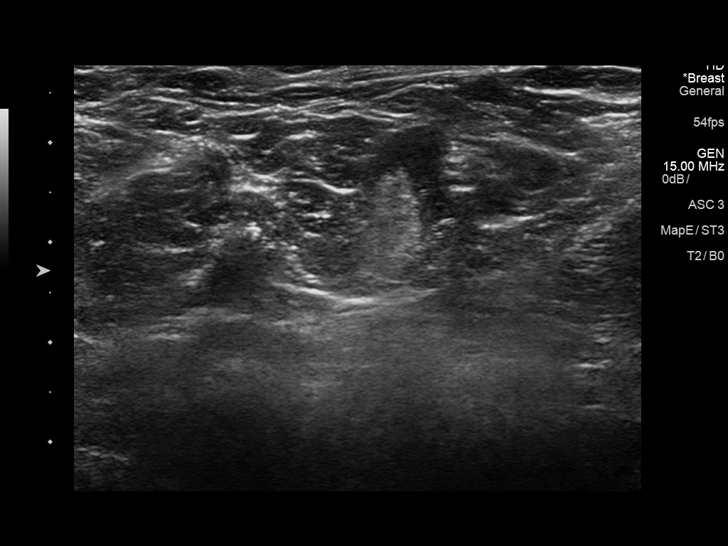
[im 13/13]
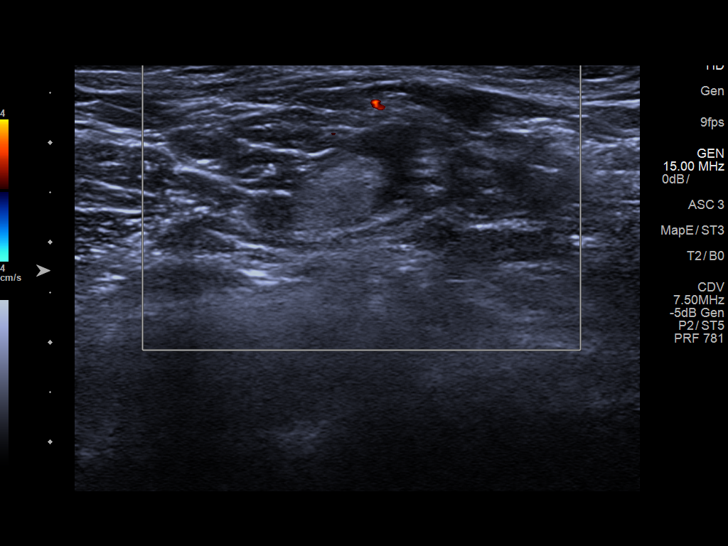

[13 of 13 positions shown; findings below may reference images not displayed]

FINDINGS: On physical exam, there is a firm palpable mass measuring
approximately 2 cm in the left 9 o'clock breast.

Targeted ultrasound is performed, showing irregular hypoechoic mass
in the left 9 o'clock breast 4 cm from the nipple measuring 2.4 by
0.8 by 0.8 cm. Peripheral increased blood flow is documented on
Doppler ultrasound. Ultrasound examination of the left axilla
demonstrates 1 indeterminate lymph node.
IMPRESSION: Palpable left breast 9 o'clock mass, for which ultrasound-guided
core needle biopsy is recommended.

One indeterminate left axillary lymph node. Ultrasound-guided core
needle biopsy should be performed if the pathology results from the
left breast mass biopsy demonstrate malignancy. Otherwise six-month
follow-up of the left axilla is recommended.

RECOMMENDATION:
Ultrasound-guided core needle biopsy of left breast 9 o'clock mass.

Ultrasound-guided core needle biopsy versus 6 month follow-up of
indeterminate left axillary lymph node.

I have discussed the findings and recommendations with the patient.
Results were also provided in writing at the conclusion of the
visit. If applicable, a reminder letter will be sent to the patient
regarding the next appointment.

BI-RADS CATEGORY  4: Suspicious.

## 2018-04-22 ENCOUNTER — Emergency Department (HOSPITAL_COMMUNITY): Payer: Medicaid Other

## 2018-04-22 ENCOUNTER — Encounter (HOSPITAL_COMMUNITY): Payer: Self-pay | Admitting: Emergency Medicine

## 2018-04-22 ENCOUNTER — Other Ambulatory Visit: Payer: Self-pay

## 2018-04-22 ENCOUNTER — Emergency Department (HOSPITAL_COMMUNITY)
Admission: EM | Admit: 2018-04-22 | Discharge: 2018-04-22 | Disposition: A | Discharge status: Home / Self Care | Payer: Medicaid Other | Attending: Emergency Medicine | Admitting: Emergency Medicine

## 2018-04-22 DIAGNOSIS — J111 Influenza due to unidentified influenza virus with other respiratory manifestations: Secondary | ICD-10-CM | POA: Diagnosis not present

## 2018-04-22 DIAGNOSIS — R05 Cough: Secondary | ICD-10-CM | POA: Diagnosis present

## 2018-04-22 DIAGNOSIS — Z87891 Personal history of nicotine dependence: Secondary | ICD-10-CM | POA: Diagnosis not present

## 2018-04-22 DIAGNOSIS — R69 Illness, unspecified: Secondary | ICD-10-CM

## 2018-04-22 MED ORDER — IBUPROFEN 800 MG PO TABS
800.0000 mg | ORAL_TABLET | Freq: Once | ORAL | Status: AC
  Administered 2018-04-22: 800 mg via ORAL
  Filled 2018-04-22: qty 1

## 2018-04-22 MED ORDER — IBUPROFEN 800 MG PO TABS: 800.0000 mg | ORAL_TABLET | Freq: Three times a day (TID) | ORAL | 0 refills | Status: DC

## 2018-04-22 MED ORDER — BENZONATATE 100 MG PO CAPS: 100.0000 mg | ORAL_CAPSULE | Freq: Three times a day (TID) | ORAL | 0 refills | Status: DC | PRN

## 2018-04-22 NOTE — ED Notes (Signed)
From Rad 

## 2018-04-22 NOTE — Discharge Instructions (Addendum)
Drink plenty of fluids.  Alternate the ibuprofen with tylenol.  Ibuprofen every 6 hrs, tylenol every 4 hrs.  Follow-up with your doctor for recheck, return to ER for any worsening symptoms

## 2018-04-22 NOTE — ED Provider Notes (Signed)
[facility] EMERGENCY DEPARTMENT Provider Note   CSN: [id number] Arrival date & time: 04/22/18  1054     History   Chief Complaint Chief Complaint  Patient presents with  . Cough    HPI Linda Skinner is a 27 y.o. female.  HPI   Linda Skinner is a 27 y.o. female who presents to the Emergency Department complaining of cough, chills, generalized body aches, and fever.  Symptoms began 1 day prior to arrival.  She states that her daughter has been diagnosed with the flu earlier this week.  She states her symptoms are similar.  She has been taking over-the-counter analgesic without relief.  Today, she states the body aches have been worse.  She describes cough is nonproductive and associated with some nasal congestion as well.  No chest pain, abdominal pain nausea, vomiting, or diarrhea.  No shortness of breath.  She is not received flu vaccine this year.  Past Medical History:  Diagnosis Date  . Gestational diabetes    metformin  . Medical history non-contributory     Patient Active Problem List   Diagnosis Date Noted  . Encounter for tubal ligation 09/16/2017  . SVD (spontaneous vaginal delivery) 09/15/2017  . Gestational diabetes mellitus, class A2 06/17/2017  . Ovarian cyst 05/26/2017  . Maternal varicella, non-immune 02/18/2017  . Supervision of high risk pregnancy, antepartum 02/17/2017    Past Surgical History:  Procedure Laterality Date  . MOUTH SURGERY    . TUBAL LIGATION N/A 09/15/2017   Procedure: POST PARTUM TUBAL LIGATION;  Surgeon: Kathrynn Running, MD;  Location: [facility] BIRTHING SUITES;  Service: Gynecology;  Laterality: N/A;  . WISDOM TOOTH EXTRACTION       OB History    Gravida  6   Para  4   Term  4   Preterm      AB  2   Living  4     SAB      TAB      Ectopic      Multiple  0   Live Births  4            Home Medications    Prior to Admission medications   Medication Sig Start Date End Date Taking? Authorizing Provider    benzonatate (TESSALON) 100 MG capsule Take 1 capsule (100 mg total) by mouth 3 (three) times daily as needed for cough. Swallow whole, do not chew 04/22/18   Janiene Aarons, PA-C  docusate sodium (COLACE) 100 MG capsule Take 1 capsule (100 mg total) by mouth 2 (two) times daily as needed for mild constipation or moderate constipation. Patient not taking: Reported on 10/20/2017 09/16/17   Tereso Newcomer, MD  ibuprofen (ADVIL,MOTRIN) 800 MG tablet Take 1 tablet (800 mg total) by mouth 3 (three) times daily. Take with food 04/22/18   Anzley Dibbern, PA-C  oxyCODONE-acetaminophen (PERCOCET/ROXICET) 5-325 MG tablet Take 1 tablet by mouth every 4 (four) hours as needed for severe pain. Patient not taking: Reported on 09/27/2017 09/16/17   Tereso Newcomer, MD    Family History Family History  Problem Relation Age of Onset  . Lupus Mother   . Cancer Paternal Grandfather   . Atrial fibrillation Maternal Grandmother     Social History Social History   Tobacco Use  . Smoking status: Former Smoker    Packs/day: 0.01    Years: 4.00    Pack years: 0.04    Types: Cigars  . Smokeless tobacco: Never Used  Substance Use  Topics  . Alcohol use: No  . Drug use: No     Allergies   Patient has no known allergies.   Review of Systems Review of Systems  Constitutional: Negative for activity change, appetite change, chills and fever.  HENT: Positive for congestion. Negative for facial swelling, rhinorrhea, sore throat and trouble swallowing.   Eyes: Negative for visual disturbance.  Respiratory: Positive for cough. Negative for chest tightness, shortness of breath, wheezing and stridor.   Cardiovascular: Negative for chest pain.  Gastrointestinal: Negative for abdominal pain, diarrhea, nausea and vomiting.  Genitourinary: Negative for dysuria.  Musculoskeletal: Positive for myalgias (Generalized body aches). Negative for arthralgias, back pain, neck pain and neck stiffness.  Skin: Negative  for rash.  Neurological: Negative for dizziness, syncope, weakness, numbness and headaches.  Hematological: Negative for adenopathy.  Psychiatric/Behavioral: Negative for confusion.     Physical Exam Updated Vital Signs BP 125/66 (BP Location: Right Arm)   Pulse 99   Temp (!) 101 F (38.3 C) (Oral) Comment: pt in a heavy coat  Resp 18   Ht 5\' 4"  (1.626 m)   Wt 113.4 kg   LMP 04/21/2018   SpO2 98%   BMI 42.91 kg/m   Physical Exam  Constitutional: She is oriented to person, place, and time. She appears well-developed. No distress.  HENT:  Head: Normocephalic and atraumatic.  Right Ear: Tympanic membrane and ear canal normal.  Left Ear: Tympanic membrane and ear canal normal.  Nose: Mucosal edema and rhinorrhea present.  Mouth/Throat: Uvula is midline and mucous membranes are normal. No oral lesions. No trismus in the jaw. No uvula swelling. No oropharyngeal exudate, posterior oropharyngeal edema, posterior oropharyngeal erythema or tonsillar abscesses.  Eyes: Conjunctivae are normal.  Neck: Normal range of motion, full passive range of motion without pain and phonation normal. Neck supple. No Kernig's sign noted.  Cardiovascular: Normal rate, regular rhythm and intact distal pulses.  No murmur heard. Pulmonary/Chest: Effort normal and breath sounds normal. No respiratory distress. She has no wheezes. She has no rales.  Abdominal: Soft. She exhibits no distension. There is no tenderness. There is no rebound and no guarding.  Musculoskeletal: She exhibits no edema.  Lymphadenopathy:    She has no cervical adenopathy.  Neurological: She is alert and oriented to person, place, and time. She exhibits normal muscle tone. Coordination normal.  Skin: Skin is warm and dry. Capillary refill takes less than 2 seconds.  Nursing note and vitals reviewed.    ED Treatments / Results  Labs (all labs ordered are listed, but only abnormal results are displayed) Labs Reviewed - No data to  display  EKG None  Radiology Dg Chest 2 View  Result Date: 04/22/2018 CLINICAL DATA:  Dry cough.  Body aches.  Fever. EXAM: CHEST - 2 VIEW COMPARISON:  04/19/2016. FINDINGS: Mediastinum hilar structures normal. Lungs are clear. No pleural effusion pneumothorax. Heart size normal. Mild thoracic spine scoliosis. No acute bony abnormality. IMPRESSION: No acute cardiopulmonary disease. Electronically Signed   By: Maisie Fus  Register   On: 04/22/2018 12:00    Procedures Procedures (including critical care time)  Medications Ordered in ED Medications  ibuprofen (ADVIL,MOTRIN) tablet 800 mg (800 mg Oral Given 04/22/18 1212)     Initial Impression / Assessment and Plan / ED Course  I have reviewed the triage vital signs and the nursing notes.  Pertinent labs & imaging results that were available during my care of the patient were reviewed by me and considered in my medical decision  making (see chart for details).     Patient nontoxic-appearing.  Febrile with generalized body aches and cough.  Symptoms are most likely viral and related to flulike illness.  Patient agrees to symptomatic treatment, rest, fluids, prescription for Tessalon and ibuprofen.  Close outpatient follow-up and return precautions were discussed.  Appropriate for discharge home.  Final Clinical Impressions(s) / ED Diagnoses   Final diagnoses:  Influenza-like illness    ED Discharge Orders         Ordered    benzonatate (TESSALON) 100 MG capsule  3 times daily PRN     04/22/18 1207    ibuprofen (ADVIL,MOTRIN) 800 MG tablet  3 times daily     04/22/18 1207           Pauline Ausriplett, Triston Skare, PA-C 04/22/18 1506    Sabas SousBero, Michael M, MD 04/22/18 1621

## 2018-04-22 NOTE — ED Triage Notes (Signed)
Patient complaining of cough, body aches, chills, and fever starting yesterday. States her daughter was diagnosed with the flu this week.

## 2018-04-22 NOTE — ED Notes (Signed)
Pt has not had flu shot Daughter dx'd with flu this week  Pt to ED for eval for flu

## 2018-05-05 ENCOUNTER — Encounter (HOSPITAL_COMMUNITY): Payer: Self-pay | Admitting: *Deleted

## 2018-05-05 ENCOUNTER — Other Ambulatory Visit: Payer: Self-pay

## 2018-05-05 ENCOUNTER — Emergency Department (HOSPITAL_COMMUNITY): Payer: Medicaid Other

## 2018-05-05 ENCOUNTER — Emergency Department (HOSPITAL_COMMUNITY)
Admission: EM | Admit: 2018-05-05 | Discharge: 2018-05-05 | Disposition: A | Discharge status: Home / Self Care | Payer: Medicaid Other | Attending: Emergency Medicine | Admitting: Emergency Medicine

## 2018-05-05 DIAGNOSIS — J189 Pneumonia, unspecified organism: Secondary | ICD-10-CM | POA: Insufficient documentation

## 2018-05-05 DIAGNOSIS — R079 Chest pain, unspecified: Secondary | ICD-10-CM | POA: Diagnosis present

## 2018-05-05 DIAGNOSIS — Z87891 Personal history of nicotine dependence: Secondary | ICD-10-CM | POA: Diagnosis not present

## 2018-05-05 LAB — LACTIC ACID, PLASMA: Lactic Acid, Venous: 1 mmol/L (ref 0.5–1.9)

## 2018-05-05 LAB — CBC WITH DIFFERENTIAL/PLATELET
ABS IMMATURE GRANULOCYTES: 0.05 10*3/uL (ref 0.00–0.07)
BASOS PCT: 0 %
Basophils Absolute: 0 10*3/uL (ref 0.0–0.1)
Eosinophils Absolute: 0 10*3/uL (ref 0.0–0.5)
Eosinophils Relative: 0 %
HEMATOCRIT: 35.8 % — AB (ref 36.0–46.0)
HEMOGLOBIN: 11.4 g/dL — AB (ref 12.0–15.0)
IMMATURE GRANULOCYTES: 1 %
LYMPHS ABS: 1.3 10*3/uL (ref 0.7–4.0)
Lymphocytes Relative: 13 %
MCH: 25.4 pg — AB (ref 26.0–34.0)
MCHC: 31.8 g/dL (ref 30.0–36.0)
MCV: 79.9 fL — AB (ref 80.0–100.0)
MONOS PCT: 8 %
Monocytes Absolute: 0.9 10*3/uL (ref 0.1–1.0)
NEUTROS ABS: 8.1 10*3/uL — AB (ref 1.7–7.7)
NEUTROS PCT: 78 %
Platelets: 297 10*3/uL (ref 150–400)
RBC: 4.48 MIL/uL (ref 3.87–5.11)
RDW: 13.3 % (ref 11.5–15.5)
WBC: 10.3 10*3/uL (ref 4.0–10.5)
nRBC: 0 % (ref 0.0–0.2)

## 2018-05-05 LAB — URINALYSIS, ROUTINE W REFLEX MICROSCOPIC
BILIRUBIN URINE: NEGATIVE
Glucose, UA: NEGATIVE mg/dL
Hgb urine dipstick: NEGATIVE
KETONES UR: 20 mg/dL — AB
Nitrite: NEGATIVE
PH: 8 (ref 5.0–8.0)
PROTEIN: 30 mg/dL — AB
Specific Gravity, Urine: 1.026 (ref 1.005–1.030)

## 2018-05-05 LAB — BASIC METABOLIC PANEL
ANION GAP: 9 (ref 5–15)
BUN: 8 mg/dL (ref 6–20)
CHLORIDE: 106 mmol/L (ref 98–111)
CO2: 22 mmol/L (ref 22–32)
Calcium: 9.4 mg/dL (ref 8.9–10.3)
Creatinine, Ser: 0.62 mg/dL (ref 0.44–1.00)
GFR calc Af Amer: >60 mL/min (ref >60)
GLUCOSE: 108 mg/dL — AB (ref 70–99)
POTASSIUM: 3.6 mmol/L (ref 3.5–5.1)
Sodium: 137 mmol/L (ref 135–145)

## 2018-05-05 LAB — HEPATIC FUNCTION PANEL
ALBUMIN: 3.8 g/dL (ref 3.5–5.0)
ALK PHOS: 64 U/L (ref 38–126)
ALT: 23 U/L (ref 0–44)
AST: 18 U/L (ref 15–41)
BILIRUBIN TOTAL: 0.5 mg/dL (ref 0.3–1.2)
Bilirubin, Direct: 0.1 mg/dL (ref 0.0–0.2)
Indirect Bilirubin: 0.4 mg/dL (ref 0.3–0.9)
Total Protein: 7.8 g/dL (ref 6.5–8.1)

## 2018-05-05 LAB — TROPONIN I: Troponin I: <0.03 ng/mL (ref <0.03)

## 2018-05-05 LAB — LIPASE, BLOOD: LIPASE: 21 U/L (ref 11–51)

## 2018-05-05 LAB — I-STAT BETA HCG BLOOD, ED (MC, WL, AP ONLY)

## 2018-05-05 LAB — D-DIMER, QUANTITATIVE (NOT AT ARMC): D DIMER QUANT: 1.25 ug{FEU}/mL — AB (ref 0.00–0.50)

## 2018-05-05 MED ORDER — HYDROMORPHONE HCL 1 MG/ML IJ SOLN
INTRAMUSCULAR | Status: AC
  Filled 2018-05-05: qty 1

## 2018-05-05 MED ORDER — ONDANSETRON HCL 4 MG/2ML IJ SOLN
4.0000 mg | Freq: Once | INTRAMUSCULAR | Status: AC
  Administered 2018-05-05: 4 mg via INTRAVENOUS

## 2018-05-05 MED ORDER — HYDROMORPHONE HCL 1 MG/ML IJ SOLN
0.5000 mg | Freq: Once | INTRAMUSCULAR | Status: AC
  Administered 2018-05-05: 0.5 mg via INTRAVENOUS

## 2018-05-05 MED ORDER — ONDANSETRON HCL 4 MG/2ML IJ SOLN
INTRAMUSCULAR | Status: AC
  Filled 2018-05-05: qty 2

## 2018-05-05 MED ORDER — ACETAMINOPHEN 500 MG PO TABS
1000.0000 mg | ORAL_TABLET | Freq: Once | ORAL | Status: AC
  Administered 2018-05-05: 1000 mg via ORAL

## 2018-05-05 MED ORDER — SODIUM CHLORIDE 0.9 % IV BOLUS
1000.0000 mL | Freq: Once | INTRAVENOUS | Status: AC
Start: 2018-05-05 — End: 2018-05-05
  Administered 2018-05-05: 1000 mL via INTRAVENOUS

## 2018-05-05 MED ORDER — LEVOFLOXACIN 500 MG PO TABS: 500.0000 mg | ORAL_TABLET | Freq: Every day | ORAL | 0 refills | Status: DC

## 2018-05-05 MED ORDER — LEVOFLOXACIN IN D5W 500 MG/100ML IV SOLN
500.0000 mg | Freq: Once | INTRAVENOUS | Status: AC
  Administered 2018-05-05: 500 mg via INTRAVENOUS
  Filled 2018-05-05: qty 100

## 2018-05-05 MED ORDER — ACETAMINOPHEN 500 MG PO TABS
ORAL_TABLET | ORAL | Status: AC
  Filled 2018-05-05: qty 2

## 2018-05-05 MED ORDER — PANTOPRAZOLE SODIUM 40 MG IV SOLR
40.0000 mg | Freq: Once | INTRAVENOUS | Status: AC
  Administered 2018-05-05: 40 mg via INTRAVENOUS
  Filled 2018-05-05: qty 40

## 2018-05-05 MED ORDER — ONDANSETRON 4 MG PO TBDP: ORAL_TABLET | ORAL | 0 refills | Status: DC

## 2018-05-05 MED ORDER — ONDANSETRON HCL 4 MG/2ML IJ SOLN: 4.0000 mg | Freq: Once | INTRAMUSCULAR | Status: DC

## 2018-05-05 MED ORDER — HYDROMORPHONE HCL 1 MG/ML IJ SOLN
0.5000 mg | Freq: Once | INTRAMUSCULAR | Status: AC
  Administered 2018-05-05: 0.5 mg via INTRAVENOUS
  Filled 2018-05-05: qty 1

## 2018-05-05 MED ORDER — IOPAMIDOL (ISOVUE-370) INJECTION 76%
100.0000 mL | Freq: Once | INTRAVENOUS | Status: AC | PRN
  Administered 2018-05-05: 100 mL via INTRAVENOUS

## 2018-05-05 NOTE — ED Notes (Signed)
Ice given

## 2018-05-05 NOTE — ED Notes (Signed)
Pt taken to xray 

## 2018-05-05 NOTE — ED Notes (Signed)
EDP in with pt. Pt tearful again and states pain back at 8.

## 2018-05-05 NOTE — ED Provider Notes (Signed)
[facility] EMERGENCY DEPARTMENT Provider Note   CSN: [id number] Arrival date & time: 05/05/18  0709     History   Chief Complaint Chief Complaint  Patient presents with  . Chest Pain    HPI Linda Skinner is a 27 y.o. female.  Patient complains of chest discomfort along with vomiting fevers and aches for 2 days,   chest pain is worse with inspiration  The history is provided by the patient. No language interpreter was used.  Chest Pain   This is a new problem. The current episode started 2 days ago. The problem occurs constantly. The problem has not changed since onset.The pain is associated with breathing. The pain is present in the lateral region. The pain is at a severity of 6/10. The pain is moderate. The quality of the pain is described as pleuritic. The pain does not radiate. The symptoms are aggravated by deep breathing. Pertinent negatives include no abdominal pain, no back pain, no cough and no headaches.  Pertinent negatives for past medical history include no seizures.    Past Medical History:  Diagnosis Date  . Gestational diabetes    metformin  . Medical history non-contributory     Patient Active Problem List   Diagnosis Date Noted  . Encounter for tubal ligation 09/16/2017  . SVD (spontaneous vaginal delivery) 09/15/2017  . Gestational diabetes mellitus, class A2 06/17/2017  . Ovarian cyst 05/26/2017  . Maternal varicella, non-immune 02/18/2017  . Supervision of high risk pregnancy, antepartum 02/17/2017    Past Surgical History:  Procedure Laterality Date  . MOUTH SURGERY    . TUBAL LIGATION N/A 09/15/2017   Procedure: POST PARTUM TUBAL LIGATION;  Surgeon: Kathrynn Running, MD;  Location: [facility] BIRTHING SUITES;  Service: Gynecology;  Laterality: N/A;  . WISDOM TOOTH EXTRACTION       OB History    Gravida  6   Para  4   Term  4   Preterm      AB  2   Living  4     SAB      TAB      Ectopic      Multiple  0   Live Births  4            Home Medications    Prior to Admission medications   Medication Sig Start Date End Date Taking? Authorizing Provider  benzonatate (TESSALON) 100 MG capsule Take 1 capsule (100 mg total) by mouth 3 (three) times daily as needed for cough. Swallow whole, do not chew Patient not taking: Reported on 05/05/2018 04/22/18   Triplett, Tammy, PA-C  docusate sodium (COLACE) 100 MG capsule Take 1 capsule (100 mg total) by mouth 2 (two) times daily as needed for mild constipation or moderate constipation. Patient not taking: Reported on 10/20/2017 09/16/17   Tereso Newcomer, MD  ibuprofen (ADVIL,MOTRIN) 800 MG tablet Take 1 tablet (800 mg total) by mouth 3 (three) times daily. Take with food Patient not taking: Reported on 05/05/2018 04/22/18   Triplett, Tammy, PA-C  levofloxacin (LEVAQUIN) 500 MG tablet Take 1 tablet (500 mg total) by mouth daily. 05/05/18   Bethann Berkshire, MD  ondansetron (ZOFRAN ODT) 4 MG disintegrating tablet 4mg  ODT q4 hours prn nausea/vomit 05/05/18   Bethann Berkshire, MD  oxyCODONE-acetaminophen (PERCOCET/ROXICET) 5-325 MG tablet Take 1 tablet by mouth every 4 (four) hours as needed for severe pain. Patient not taking: Reported on 09/27/2017 09/16/17   Tereso Newcomer, MD  Family History Family History  Problem Relation Age of Onset  . Lupus Mother   . Cancer Paternal Grandfather   . Atrial fibrillation Maternal Grandmother     Social History Social History   Tobacco Use  . Smoking status: Former Smoker    Packs/day: 0.01    Years: 4.00    Pack years: 0.04    Types: Cigars  . Smokeless tobacco: Never Used  Substance Use Topics  . Alcohol use: No  . Drug use: No     Allergies   Patient has no known allergies.   Review of Systems Review of Systems  Constitutional: Positive for chills and fatigue. Negative for appetite change.  HENT: Negative for congestion, ear discharge and sinus pressure.   Eyes: Negative for discharge.  Respiratory: Negative for  cough.   Cardiovascular: Positive for chest pain.  Gastrointestinal: Negative for abdominal pain and diarrhea.  Genitourinary: Negative for frequency and hematuria.  Musculoskeletal: Negative for back pain.  Skin: Negative for rash.  Neurological: Negative for seizures and headaches.  Psychiatric/Behavioral: Negative for hallucinations.     Physical Exam Updated Vital Signs BP (!) 114/58   Pulse (!) 110   Temp (!) 101 F (38.3 C) (Oral)   Resp (!) 26   Ht 5' 4.5" (1.638 m)   Wt 113.4 kg   LMP 04/21/2018   SpO2 97%   BMI 42.25 kg/m   Physical Exam  Constitutional: She is oriented to person, place, and time. She appears well-developed.  HENT:  Head: Normocephalic.  Eyes: Conjunctivae and EOM are normal. No scleral icterus.  Neck: Neck supple. No thyromegaly present.  Cardiovascular: Regular rhythm. Exam reveals no gallop and no friction rub.  No murmur heard. Tachycardia  Pulmonary/Chest: No stridor. She has no wheezes. She has no rales. She exhibits no tenderness.  Abdominal: She exhibits no distension. There is no tenderness. There is no rebound.  Musculoskeletal: Normal range of motion. She exhibits no edema.  Lymphadenopathy:    She has no cervical adenopathy.  Neurological: She is oriented to person, place, and time. She exhibits normal muscle tone. Coordination normal.  Skin: No rash noted. No erythema.  Psychiatric: She has a normal mood and affect. Her behavior is normal.     ED Treatments / Results  Labs (all labs ordered are listed, but only abnormal results are displayed) Labs Reviewed  CBC WITH DIFFERENTIAL/PLATELET - Abnormal; Notable for the following components:      Result Value   Hemoglobin 11.4 (*)    HCT 35.8 (*)    MCV 79.9 (*)    MCH 25.4 (*)    Neutro Abs 8.1 (*)    All other components within normal limits  BASIC METABOLIC PANEL - Abnormal; Notable for the following components:   Glucose, Bld 108 (*)    All other components within normal  limits  URINALYSIS, ROUTINE W REFLEX MICROSCOPIC - Abnormal; Notable for the following components:   Color, Urine AMBER (*)    APPearance HAZY (*)    Ketones, ur 20 (*)    Protein, ur 30 (*)    Leukocytes, UA TRACE (*)    Bacteria, UA RARE (*)    All other components within normal limits  D-DIMER, QUANTITATIVE (NOT AT [facility]) - Abnormal; Notable for the following components:   D-Dimer, Quant 1.25 (*)    All other components within normal limits  CULTURE, BLOOD (ROUTINE X 2)  CULTURE, BLOOD (ROUTINE X 2)  TROPONIN I  LIPASE, BLOOD  HEPATIC  FUNCTION PANEL  LACTIC ACID, PLASMA  I-STAT BETA HCG BLOOD, ED ([facility], [facility], [facility] ONLY)    EKG None  Radiology Ct Angio Chest Pe W And/or Wo Contrast  Result Date: 05/05/2018 CLINICAL DATA:  Chest pain and vomiting with recent flu diagnosis EXAM: CT ANGIOGRAPHY CHEST WITH CONTRAST TECHNIQUE: Multidetector CT imaging of the chest was performed using the standard protocol during bolus administration of intravenous contrast. Multiplanar CT image reconstructions and MIPs were obtained to evaluate the vascular anatomy. CONTRAST:  ISOVUE-370 IOPAMIDOL (ISOVUE-370) INJECTION 76% COMPARISON:  Plain film from earlier in the same day FINDINGS: Cardiovascular: Thoracic aorta is well visualize without aneurysmal dilatation or dissection. No atherosclerotic calcifications are seen. No cardiac enlargement is noted. No coronary calcifications are seen. The pulmonary artery shows a normal branching pattern. No filling defects are identified to suggest pulmonary embolism. Mediastinum/Nodes: Thoracic inlet is within normal limits. No mediastinal adenopathy is noted. A few small right infrahilar lymph nodes are identified likely reactive in nature. Considerable infiltrate is noted in the right lower lobe medially obscured by the cardiac border on the recent exam. The esophagus is within normal limits. Lungs/Pleura: The lungs are well aerated bilaterally with the exception of  the medial aspect of the right lower lobe as described above consistent with acute pneumonia. No sizable effusion is seen. No pneumothorax is noted. Upper Abdomen: Visualized upper abdomen is within normal limits. Musculoskeletal: Bony structures show no acute abnormality. Review of the MIP images confirms the above findings. IMPRESSION: Acute right lower lobe pneumonia medially with associated reactive right hilar adenopathy. No evidence of pulmonary emboli. No other focal abnormality is seen. Electronically Signed   By: Alcide Clever M.D.   On: 05/05/2018 12:10   Dg Abd Acute W/chest  Result Date: 05/05/2018 CLINICAL DATA:  One day history of productive cough, chest pain, fever, nausea and vomiting. EXAM: DG ABDOMEN ACUTE W/ 1V CHEST COMPARISON:  No prior abdominal x-ray. Chest x-ray 04/22/2018. CT abdomen pelvis 01/26/2013. FINDINGS: Bowel gas pattern unremarkable without evidence of obstruction or significant ileus. No evidence of free air or significant air-fluid levels on the erect image. Expected stool burden in the colon. No visible opaque urinary tract calculi. Phleboliths in both sides of the pelvis. BILATERAL tubal ligation clips. Regional skeleton unremarkable. Cardiac silhouette upper normal in size. Hilar and mediastinal contours unremarkable. Lungs clear. Bronchovascular markings normal. Pulmonary vascularity normal. No visible pleural effusions. No pneumothorax. IMPRESSION: 1. No acute abdominal abnormality. 2.  No acute cardiopulmonary disease. Electronically Signed   By: Hulan Saas M.D.   On: 05/05/2018 09:01    Procedures Procedures (including critical care time)  Medications Ordered in ED Medications  ondansetron (ZOFRAN) injection 4 mg (4 mg Intravenous Given 05/05/18 0753)  sodium chloride 0.9 % bolus 1,000 mL (0 mLs Intravenous Stopped 05/05/18 0910)  pantoprazole (PROTONIX) injection 40 mg (40 mg Intravenous Given 05/05/18 0804)  HYDROmorphone (DILAUDID) injection 0.5 mg (0.5  mg Intravenous Given 05/05/18 0805)  HYDROmorphone (DILAUDID) injection 0.5 mg (0.5 mg Intravenous Given 05/05/18 0926)  sodium chloride 0.9 % bolus 1,000 mL (0 mLs Intravenous Stopped 05/05/18 1027)  ondansetron (ZOFRAN) injection 4 mg (4 mg Intravenous Given 05/05/18 1028)  iopamidol (ISOVUE-370) 76 % injection 100 mL (100 mLs Intravenous Contrast Given 05/05/18 1128)  levofloxacin (LEVAQUIN) IVPB 500 mg (0 mg Intravenous Stopped 05/05/18 1400)  acetaminophen (TYLENOL) tablet 1,000 mg (1,000 mg Oral Given 05/05/18 1244)     Initial Impression / Assessment and Plan / ED Course  I  have reviewed the triage vital signs and the nursing notes.  Pertinent labs & imaging results that were available during my care of the patient were reviewed by me and considered in my medical decision making (see chart for details).     Patient with pneumonia.  After patient given Tylenol and fluids her tachycardia is improved..  Patient is not hypoxic, lactic acid is normal white blood count upper limits of normal.  Patient is nontoxic.  She will be treated at home with Levaquin for pneumonia.  She is going to follow-up next week with her doctor or return sooner if problems  Final Clinical Impressions(s) / ED Diagnoses   Final diagnoses:  Community acquired pneumonia, unspecified laterality    ED Discharge Orders         Ordered    levofloxacin (LEVAQUIN) 500 MG tablet  Daily     05/05/18 1433    ondansetron (ZOFRAN ODT) 4 MG disintegrating tablet     05/05/18 1433           Bethann Berkshire, MD 05/05/18 1440

## 2018-05-05 NOTE — ED Notes (Signed)
Pt returned from CT. C/o headache rating 6. States chest feels better. Nad.

## 2018-05-05 NOTE — ED Notes (Signed)
Pt started breathing hard during iv insertion, pt states pain is worse rating 8 at present, pt tearful. States has episodes where pain is worse. This episode lasted approx 2 min. Encouraged to slow deep breathe

## 2018-05-05 NOTE — ED Notes (Signed)
Pt taken to ct 

## 2018-05-05 NOTE — ED Notes (Signed)
Per sec hr on monitor 140. In to check pt and pt dry heaving. edp aware and orders received. Pt c/o pain to under breasts again 8. Pt tearful and anxious. Advised to slow deep breathe.

## 2018-05-05 NOTE — ED Triage Notes (Addendum)
Pt c/o right chest pain and under bilateral breasts, generalized body aches and vomiting that started last night. Diarrhea started this morning. Pt reports the pain is worse with breathing. Pt diagnosed with flu 2 weeks ago.

## 2018-05-05 NOTE — Discharge Instructions (Signed)
Drink plenty of fluids take Motrin or Tylenol for fevers and aches.  Return here this weekend if getting worse otherwise follow-up with her primary care doctor next week

## 2018-05-06 ENCOUNTER — Telehealth (HOSPITAL_BASED_OUTPATIENT_CLINIC_OR_DEPARTMENT_OTHER): Payer: Self-pay | Admitting: Emergency Medicine

## 2018-05-06 LAB — BLOOD CULTURE ID PANEL (REFLEXED)
Acinetobacter baumannii: NOT DETECTED
CANDIDA TROPICALIS: NOT DETECTED
Candida albicans: NOT DETECTED
Candida glabrata: NOT DETECTED
Candida krusei: NOT DETECTED
Candida parapsilosis: NOT DETECTED
Enterobacter cloacae complex: NOT DETECTED
Enterobacteriaceae species: NOT DETECTED
Enterococcus species: NOT DETECTED
Escherichia coli: NOT DETECTED
Haemophilus influenzae: NOT DETECTED
Klebsiella oxytoca: NOT DETECTED
Klebsiella pneumoniae: NOT DETECTED
Listeria monocytogenes: NOT DETECTED
Neisseria meningitidis: NOT DETECTED
Proteus species: NOT DETECTED
Pseudomonas aeruginosa: NOT DETECTED
STREPTOCOCCUS AGALACTIAE: NOT DETECTED
Serratia marcescens: NOT DETECTED
Staphylococcus aureus (BCID): NOT DETECTED
Staphylococcus species: NOT DETECTED
Streptococcus pneumoniae: DETECTED — AB
Streptococcus pyogenes: NOT DETECTED
Streptococcus species: DETECTED — AB

## 2018-05-06 NOTE — Progress Notes (Signed)
  PHARMACY - PHYSICIAN COMMUNICATION CRITICAL VALUE ALERT - BLOOD CULTURE IDENTIFICATION (BCID)  Linda KannerStacey Boyum is an 27 y.o. female who [facility] to [facility]/2018 with a chief complaint of chest pain.     Name of physician (or Provider) Contacted: n/a  Current antibiotics: n/a  Changes to prescribed antibiotics recommended: n/a    Results for orders placed or performed during the hospital encounter of 05/05/18  Blood Culture ID Panel (Reflexed) (Collected: 05/05/2018 12:44 PM)  Result Value Ref Range   Enterococcus species NOT DETECTED NOT DETECTED   Listeria monocytogenes NOT DETECTED NOT DETECTED   Staphylococcus species NOT DETECTED NOT DETECTED   Staphylococcus aureus (BCID) NOT DETECTED NOT DETECTED   Streptococcus species DETECTED (A) NOT DETECTED   Streptococcus agalactiae NOT DETECTED NOT DETECTED   Streptococcus pneumoniae DETECTED (A) NOT DETECTED   Streptococcus pyogenes NOT DETECTED NOT DETECTED   Acinetobacter baumannii NOT DETECTED NOT DETECTED   Enterobacteriaceae species NOT DETECTED NOT DETECTED   Enterobacter cloacae complex NOT DETECTED NOT DETECTED   Escherichia coli NOT DETECTED NOT DETECTED   Klebsiella oxytoca NOT DETECTED NOT DETECTED   Klebsiella pneumoniae NOT DETECTED NOT DETECTED   Proteus species NOT DETECTED NOT DETECTED   Serratia marcescens NOT DETECTED NOT DETECTED   Haemophilus influenzae NOT DETECTED NOT DETECTED   Neisseria meningitidis NOT DETECTED NOT DETECTED   Pseudomonas aeruginosa NOT DETECTED NOT DETECTED   Candida albicans NOT DETECTED NOT DETECTED   Candida glabrata NOT DETECTED NOT DETECTED   Candida krusei NOT DETECTED NOT DETECTED   Candida parapsilosis NOT DETECTED NOT DETECTED   Candida tropicalis NOT DETECTED NOT DETECTED    Tama Highamara Stefan Karen 05/06/2018  12:13 PM

## 2018-05-08 LAB — CULTURE, BLOOD (ROUTINE X 2)
Special Requests: ADEQUATE
Special Requests: ADEQUATE

## 2018-05-09 ENCOUNTER — Telehealth: Payer: Self-pay | Admitting: *Deleted

## 2018-05-09 NOTE — Telephone Encounter (Signed)
Post ED Visit - Positive Culture Follow-up: Unsuccessful Patient Follow-up  Culture assessed and recommendations reviewed by:  []  Enzo BiNathan Batchelder, Pharm.D. []  Celedonio MiyamotoJeremy Frens, Pharm.D., BCPS AQ-ID []  Garvin FilaMike Maccia, Pharm.D., BCPS []  Georgina PillionElizabeth Martin, Pharm.D., BCPS []  LansingMinh Pham, [address] BoulevardPharm.D., BCPS, AAHIVP []  Estella HuskMichelle Turner, Pharm.D., BCPS, AAHIVP []  Sherlynn CarbonAustin Lucas, PharmD []  Pollyann SamplesAndy Johnston, PharmD, BCPS Lysle Pearlachel Rumbarger, PharmD  Positive blood culture  []  Patient discharged without antimicrobial prescription and treatment is now indicated []  Organism is resistant to prescribed ED discharge antimicrobial [x]  Patient with positive blood cultures and needs to return to ED for further evaluation.  Unable to contact patient after 3 attempts, letter will be sent to address on file  Lysle PearlRobertson, Khaniyah Bezek Talley 05/09/2018, 9:08 AM

## 2019-05-17 ENCOUNTER — Other Ambulatory Visit: Payer: Self-pay

## 2019-05-17 ENCOUNTER — Ambulatory Visit: Payer: Medicaid Other | Attending: Internal Medicine

## 2019-05-17 DIAGNOSIS — Z20822 Contact with and (suspected) exposure to covid-19: Secondary | ICD-10-CM

## 2019-05-18 LAB — NOVEL CORONAVIRUS, NAA: SARS-CoV-2, NAA: NOT DETECTED

## 2019-06-03 ENCOUNTER — Other Ambulatory Visit: Payer: Self-pay

## 2019-06-03 ENCOUNTER — Emergency Department (HOSPITAL_COMMUNITY)
Admission: EM | Admit: 2019-06-03 | Discharge: 2019-06-03 | Disposition: A | Discharge status: Home / Self Care | Payer: Medicaid Other | Attending: Emergency Medicine | Admitting: Emergency Medicine

## 2019-06-03 ENCOUNTER — Encounter (HOSPITAL_COMMUNITY): Payer: Self-pay | Admitting: Emergency Medicine

## 2019-06-03 DIAGNOSIS — Z87891 Personal history of nicotine dependence: Secondary | ICD-10-CM | POA: Diagnosis not present

## 2019-06-03 DIAGNOSIS — R102 Pelvic and perineal pain: Secondary | ICD-10-CM | POA: Diagnosis present

## 2019-06-03 DIAGNOSIS — N72 Inflammatory disease of cervix uteri: Secondary | ICD-10-CM | POA: Insufficient documentation

## 2019-06-03 DIAGNOSIS — Z79899 Other long term (current) drug therapy: Secondary | ICD-10-CM | POA: Insufficient documentation

## 2019-06-03 LAB — URINALYSIS, ROUTINE W REFLEX MICROSCOPIC
Bilirubin Urine: NEGATIVE
Glucose, UA: NEGATIVE mg/dL
Hgb urine dipstick: NEGATIVE
Ketones, ur: NEGATIVE mg/dL
Leukocytes,Ua: NEGATIVE
Nitrite: NEGATIVE
Protein, ur: NEGATIVE mg/dL
Specific Gravity, Urine: 1.02 (ref 1.005–1.030)
pH: 6 (ref 5.0–8.0)

## 2019-06-03 LAB — WET PREP, GENITAL
Sperm: NONE SEEN
Trich, Wet Prep: NONE SEEN
Yeast Wet Prep HPF POC: NONE SEEN

## 2019-06-03 LAB — HIV ANTIBODY (ROUTINE TESTING W REFLEX): HIV Screen 4th Generation wRfx: NONREACTIVE

## 2019-06-03 LAB — POC URINE PREG, ED: Preg Test, Ur: NEGATIVE

## 2019-06-03 MED ORDER — IBUPROFEN 600 MG PO TABS: 600.0000 mg | ORAL_TABLET | Freq: Four times a day (QID) | ORAL | 0 refills | Status: DC | PRN

## 2019-06-03 MED ORDER — LIDOCAINE HCL (PF) 1 % IJ SOLN
INTRAMUSCULAR | Status: AC
  Administered 2019-06-03: 1 mL via INTRAMUSCULAR
  Filled 2019-06-03: qty 2

## 2019-06-03 MED ORDER — DOXYCYCLINE HYCLATE 100 MG PO CAPS: 100.0000 mg | ORAL_CAPSULE | Freq: Two times a day (BID) | ORAL | 0 refills | Status: DC

## 2019-06-03 MED ORDER — CEFTRIAXONE SODIUM 500 MG IJ SOLR
500.0000 mg | Freq: Once | INTRAMUSCULAR | Status: AC
  Administered 2019-06-03: 500 mg via INTRAMUSCULAR
  Filled 2019-06-03: qty 500

## 2019-06-03 NOTE — ED Triage Notes (Signed)
Pt reports her last period was 8 days late and then was just spotting. Has been having llq pain for the past few days.

## 2019-06-03 NOTE — ED Provider Notes (Signed)
[facility] EMERGENCY DEPARTMENT Provider Note   CSN: [id number] Arrival date & time: 06/03/19  1431     History Chief Complaint  Patient presents with  . Abdominal Pain    Linda Skinner is a 29 y.o. female.  The history is provided by the patient. No language interpreter was used.  Abdominal Pain     28 year old female presenting for evaluation of abdominal pain.  Patient report for the past 2 days she has had pain to her left lower abdomen/pelvic region.  She described pain as a gradual onset sharp achy pain, persistent, increased with movement, improves when she lays still and rates at 7 out of 10.  There is no associated fever chills no chest pain shortness of breath productive cough no back pain dysuria hematuria vaginal bleeding or vaginal discharge.  Her menstrual.  Was a little later than usual.  She denies any history of prior STI.  She denies any new sexual partners.  She denies any retention of bowel or bladder habits or changes in her diet.  No postprandial pain.  She mention her last menstrual period was 8 days days and it was just spotting.  No specific treatment tried.  No recent sick contact with anyone with COVID-19.  Does have history of ovarian cyst.  History of tubal ligation.   Past Medical History:  Diagnosis Date  . Gestational diabetes    metformin  . Medical history non-contributory     Patient Active Problem List   Diagnosis Date Noted  . Encounter for tubal ligation 09/16/2017  . SVD (spontaneous vaginal delivery) 09/15/2017  . Gestational diabetes mellitus, class A2 06/17/2017  . Ovarian cyst 05/26/2017  . Maternal varicella, non-immune 02/18/2017  . Supervision of high risk pregnancy, antepartum 02/17/2017    Past Surgical History:  Procedure Laterality Date  . MOUTH SURGERY    . TUBAL LIGATION N/A 09/15/2017   Procedure: POST PARTUM TUBAL LIGATION;  Surgeon: Kathrynn Running, MD;  Location: [facility] BIRTHING SUITES;  Service: Gynecology;   Laterality: N/A;  . WISDOM TOOTH EXTRACTION       OB History    Gravida  6   Para  4   Term  4   Preterm      AB  2   Living  4     SAB      TAB      Ectopic      Multiple  0   Live Births  4           Family History  Problem Relation Age of Onset  . Lupus Mother   . Cancer Paternal Grandfather   . Atrial fibrillation Maternal Grandmother     Social History   Tobacco Use  . Smoking status: Former Smoker    Packs/day: 0.01    Years: 4.00    Pack years: 0.04    Types: Cigars  . Smokeless tobacco: Never Used  Substance Use Topics  . Alcohol use: No  . Drug use: No    Home Medications Prior to Admission medications   Medication Sig Start Date End Date Taking? Authorizing Provider  benzonatate (TESSALON) 100 MG capsule Take 1 capsule (100 mg total) by mouth 3 (three) times daily as needed for cough. Swallow whole, do not chew Patient not taking: Reported on 05/05/2018 04/22/18   Triplett, Tammy, PA-C  docusate sodium (COLACE) 100 MG capsule Take 1 capsule (100 mg total) by mouth 2 (two) times daily as needed for mild constipation  or moderate constipation. Patient not taking: Reported on 10/20/2017 09/16/17   Tereso Newcomer, MD  ibuprofen (ADVIL,MOTRIN) 800 MG tablet Take 1 tablet (800 mg total) by mouth 3 (three) times daily. Take with food Patient not taking: Reported on 05/05/2018 04/22/18   Triplett, Tammy, PA-C  levofloxacin (LEVAQUIN) 500 MG tablet Take 1 tablet (500 mg total) by mouth daily. 05/05/18   Bethann Berkshire, MD  ondansetron (ZOFRAN ODT) 4 MG disintegrating tablet 4mg  ODT q4 hours prn nausea/vomit 05/05/18   14/5/19, MD  oxyCODONE-acetaminophen (PERCOCET/ROXICET) 5-325 MG tablet Take 1 tablet by mouth every 4 (four) hours as needed for severe pain. Patient not taking: Reported on 09/27/2017 09/16/17   09/18/17, MD    Allergies    Patient has no known allergies.  Review of Systems   Review of Systems  Gastrointestinal:  Positive for abdominal pain.  All other systems reviewed and are negative.   Physical Exam Updated Vital Signs BP 104/76 (BP Location: Right Arm)   Pulse 100   Temp 97.8 F (36.6 C) (Oral)   Resp 18   Ht 5\' 4"  (1.626 m)   Wt 110.2 kg   LMP 05/29/2019   SpO2 100%   BMI 41.71 kg/m   Physical Exam Vitals and nursing note reviewed.  Constitutional:      General: She is not in acute distress.    Appearance: She is well-developed. She is obese.  HENT:     Head: Atraumatic.  Eyes:     Conjunctiva/sclera: Conjunctivae normal.  Cardiovascular:     Rate and Rhythm: Normal rate and regular rhythm.  Pulmonary:     Effort: Pulmonary effort is normal.     Breath sounds: Normal breath sounds.  Abdominal:     Palpations: Abdomen is soft.     Tenderness: There is abdominal tenderness in the left lower quadrant.  Genitourinary:    Comments: Chaperone present during exam. No inguinal lymphadenopathy or inguinal hernia noted. Normal external genitalia. Mild discomfort with speculum insertion. Moderate amount of greenish-yellow vaginal discharge noted in vaginal vault. Cervical os is closed. On bimanual examination, left adnexal tenderness without cervical motion tenderness. Musculoskeletal:     Cervical back: Neck supple.  Skin:    Findings: No rash.  Neurological:     Mental Status: She is alert.     ED Results / Procedures / Treatments   Labs (all labs ordered are listed, but only abnormal results are displayed) Labs Reviewed  WET PREP, GENITAL - Abnormal; Notable for the following components:      Result Value   WBC, Wet Prep HPF POC MANY (*)    All other components within normal limits  URINALYSIS, ROUTINE W REFLEX MICROSCOPIC  RPR  HIV ANTIBODY (ROUTINE TESTING W REFLEX)  POC URINE PREG, ED  GC/CHLAMYDIA PROBE AMP ([place]) NOT AT [facility]    EKG None  Radiology No results found.  Procedures Procedures (including critical care time)  Medications Ordered in  ED Medications  cefTRIAXone (ROCEPHIN) injection 500 mg (500 mg Intramuscular Given 06/03/19 1859)  lidocaine (PF) (XYLOCAINE) 1 % injection (1 mL Intramuscular Given 06/03/19 1859)    ED Course  I have reviewed the triage vital signs and the nursing notes.  Pertinent labs & imaging results that were available during my care of the patient were reviewed by me and considered in my medical decision making (see chart for details).    MDM Rules/Calculators/A&P  BP 116/68 (BP Location: Left Arm)   Pulse 71   Temp 98.3 F (36.8 C) (Oral)   Resp 16   Ht 5\' 4"  (1.626 m)   Wt 110.2 kg   LMP 05/29/2019   SpO2 100%   BMI 41.71 kg/m   Final Clinical Impression(s) / ED Diagnoses Final diagnoses:  Cervicitis  Pelvic pain in female    Rx / DC Orders ED Discharge Orders         Ordered    doxycycline (VIBRAMYCIN) 100 MG capsule  2 times daily     06/03/19 1929    ibuprofen (ADVIL) 600 MG tablet  Every 6 hours PRN     06/03/19 1929         5:18 PM Patient with pain to her left lower abdomen/pelvic region for the past 2 days.  On exam, mild tenderness to left pelvic/left lower quadrant without guarding or rebound tenderness.  Patient overall well-appearing.  Will perform pelvic examination  6:33 PM Copious amount of vaginal discharge noted on pelvic semination as well as tenderness to left adnexal region concerning for cervicitis or possible PID. Patient may also have ovarian cyst that can cause pain. Low suspicion for tubo-ovarian abscess. Low suspicion for diverticular disease. Patient given Rocephin 500 mg to cover for potential gonorrhea. Will discharge home with doxycycline 100 mg twice daily X 1 week for coverage of potential chlamydia infection.   Domenic Moras, PA-C 06/03/19 Partridge, Ankit, MD 06/05/19 1452

## 2019-06-03 NOTE — Discharge Instructions (Signed)
You have been treated for potential sexually transmitted infection causing abdominal pain.  Take antibiotic as prescribed.  Take ibuprofen for pain.  A rupture ovarian cyst may cause similar pain, but it will improve over time.

## 2019-06-05 LAB — GC/CHLAMYDIA PROBE AMP (~~LOC~~) NOT AT ARMC
Chlamydia: NEGATIVE
Neisseria Gonorrhea: NEGATIVE

## 2019-06-06 LAB — RPR: RPR Ser Ql: NONREACTIVE

## 2020-02-22 ENCOUNTER — Other Ambulatory Visit: Payer: Medicaid Other

## 2020-02-22 ENCOUNTER — Other Ambulatory Visit: Payer: Self-pay

## 2020-02-22 DIAGNOSIS — Z20822 Contact with and (suspected) exposure to covid-19: Secondary | ICD-10-CM

## 2020-02-24 ENCOUNTER — Ambulatory Visit
Admission: EM | Admit: 2020-02-24 | Discharge: 2020-02-24 | Disposition: A | Discharge status: Home / Self Care | Payer: Medicaid Other | Attending: Emergency Medicine | Admitting: Emergency Medicine

## 2020-02-24 ENCOUNTER — Encounter: Payer: Self-pay | Admitting: Emergency Medicine

## 2020-02-24 DIAGNOSIS — J069 Acute upper respiratory infection, unspecified: Secondary | ICD-10-CM | POA: Diagnosis not present

## 2020-02-24 LAB — SARS-COV-2, NAA 2 DAY TAT

## 2020-02-24 LAB — NOVEL CORONAVIRUS, NAA: SARS-CoV-2, NAA: NOT DETECTED

## 2020-02-24 LAB — SPECIMEN STATUS REPORT

## 2020-02-24 MED ORDER — DEXAMETHASONE 4 MG PO TABS: 4.0000 mg | ORAL_TABLET | Freq: Every day | ORAL | 0 refills | Status: AC

## 2020-02-24 MED ORDER — BENZONATATE 100 MG PO CAPS: 100.0000 mg | ORAL_CAPSULE | Freq: Three times a day (TID) | ORAL | 0 refills | Status: DC

## 2020-02-24 MED ORDER — FLUTICASONE PROPIONATE 50 MCG/ACT NA SUSP: 1.0000 | Freq: Every day | NASAL | 0 refills | Status: DC

## 2020-02-24 MED ORDER — CETIRIZINE HCL 10 MG PO TABS: 10.0000 mg | ORAL_TABLET | Freq: Every day | ORAL | 0 refills | Status: DC

## 2020-02-24 NOTE — ED Triage Notes (Signed)
Patient has cough, congestion, runny nose x 5 days . Neg covid test as of this morning, more likely sinus infection

## 2020-02-24 NOTE — Discharge Instructions (Addendum)
Get plenty of rest and push fluids Tessalon Perles prescribed for cough Zyrtec for nasal congestion, runny nose, and/or sore throat Flonase for nasal congestion and runny nose Decadron was prescribed Use medications daily for symptom relief Use OTC medications like ibuprofen or tylenol as needed fever or pain Call or go to the ED if you have any new or worsening symptoms such as fever, worsening cough, shortness of breath, chest tightness, chest pain, turning blue, changes in mental status, etc...  

## 2020-02-24 NOTE — ED Provider Notes (Signed)
[facility] CARE CENTER   [id number] 02/24/20 Arrival Time: 0905   Chief Complaint  Patient presents with  . URI     SUBJECTIVE: History from: patient and family.  Linda Skinner is a 29 y.o. female who presents to the urgent care with a complaint of cough, nasal congestion and and runny nose for the past 5 days.  She is tested negative for COVID-19.  Denies sick exposure to COVID, flu or strep.  Denies recent travel.  Has tried OTC medication without relief.  Denies aggravating factors.  Denies previous symptoms in the past.   Denies fever, chills, fatigue, sinus pain, sore throat, SOB, wheezing, chest pain, nausea, changes in bowel or bladder habits.     ROS: As per HPI.  All other pertinent ROS negative.     Past Medical History:  Diagnosis Date  . Gestational diabetes    metformin  . Medical history non-contributory    Past Surgical History:  Procedure Laterality Date  . MOUTH SURGERY    . TUBAL LIGATION N/A 09/15/2017   Procedure: POST PARTUM TUBAL LIGATION;  Surgeon: Kathrynn Running, MD;  Location: [facility] BIRTHING SUITES;  Service: Gynecology;  Laterality: N/A;  . WISDOM TOOTH EXTRACTION     No Known Allergies No current facility-administered medications on file prior to encounter.   Current Outpatient Medications on File Prior to Encounter  Medication Sig Dispense Refill  . docusate sodium (COLACE) 100 MG capsule Take 1 capsule (100 mg total) by mouth 2 (two) times daily as needed for mild constipation or moderate constipation. (Patient not taking: Reported on 10/20/2017) 30 capsule 2  . doxycycline (VIBRAMYCIN) 100 MG capsule Take 1 capsule (100 mg total) by mouth 2 (two) times daily. One po bid x 7 days 14 capsule 0  . ibuprofen (ADVIL) 600 MG tablet Take 1 tablet (600 mg total) by mouth every 6 (six) hours as needed. 30 tablet 0  . levofloxacin (LEVAQUIN) 500 MG tablet Take 1 tablet (500 mg total) by mouth daily. 7 tablet 0  . ondansetron (ZOFRAN ODT) 4 MG  disintegrating tablet 4mg  ODT q4 hours prn nausea/vomit 12 tablet 0  . oxyCODONE-acetaminophen (PERCOCET/ROXICET) 5-325 MG tablet Take 1 tablet by mouth every 4 (four) hours as needed for severe pain. (Patient not taking: Reported on 09/27/2017) 15 tablet 0   Social History   Socioeconomic History  . Marital status: Single    Spouse name: Not on file  . Number of children: Not on file  . Years of education: Not on file  . Highest education level: Not on file  Occupational History  . Not on file  Tobacco Use  . Smoking status: Former Smoker    Packs/day: 0.01    Years: 4.00    Pack years: 0.04    Types: Cigars  . Smokeless tobacco: Never Used  Vaping Use  . Vaping Use: Never used  Substance and Sexual Activity  . Alcohol use: No  . Drug use: No  . Sexual activity: Not Currently    Birth control/protection: None  Other Topics Concern  . Not on file  Social History Narrative  . Not on file   Social Determinants of Health   Financial Resource Strain:   . Difficulty of Paying Living Expenses: Not on file  Food Insecurity:   . Worried About 09/29/2017 in the Last Year: Not on file  . Ran Out of Food in the Last Year: Not on file  Transportation Needs:   . Lack  of Transportation (Medical): Not on file  . Lack of Transportation (Non-Medical): Not on file  Physical Activity:   . Days of Exercise per Week: Not on file  . Minutes of Exercise per Session: Not on file  Stress:   . Feeling of Stress : Not on file  Social Connections:   . Frequency of Communication with Friends and Family: Not on file  . Frequency of Social Gatherings with Friends and Family: Not on file  . Attends Religious Services: Not on file  . Active Member of Clubs or Organizations: Not on file  . Attends Banker Meetings: Not on file  . Marital Status: Not on file  Intimate Partner Violence:   . Fear of Current or Ex-Partner: Not on file  . Emotionally Abused: Not on file  .  Physically Abused: Not on file  . Sexually Abused: Not on file   Family History  Problem Relation Age of Onset  . Lupus Mother   . Cancer Paternal Grandfather   . Atrial fibrillation Maternal Grandmother     OBJECTIVE:  Vitals:   02/24/20 0923  BP: 135/81  Pulse: 90  Resp: 16  Temp: 98.9 F (37.2 C)  SpO2: 98%     General appearance: alert; appears fatigued, but nontoxic; speaking in full sentences and tolerating own secretions HEENT: NCAT; Ears: EACs clear, TMs pearly gray; Eyes: PERRL.  EOM grossly intact. Sinuses: nontender; Nose: nares patent without rhinorrhea, Throat: oropharynx clear, tonsils non erythematous or enlarged, uvula midline  Neck: supple without LAD Lungs: unlabored respirations, symmetrical air entry; cough: mild; no respiratory distress; CTAB Heart: regular rate and rhythm.  Radial pulses 2+ symmetrical bilaterally Skin: warm and dry Psychological: alert and cooperative; normal mood and affect  LABS:  No results found for this or any previous visit (from the past 24 hour(s)).   ASSESSMENT & PLAN:  1. URI with cough and congestion     Meds ordered this encounter  Medications  . cetirizine (ZYRTEC ALLERGY) 10 MG tablet    Sig: Take 1 tablet (10 mg total) by mouth daily.    Dispense:  30 tablet    Refill:  0  . fluticasone (FLONASE) 50 MCG/ACT nasal spray    Sig: Place 1 spray into both nostrils daily for 14 days.    Dispense:  16 g    Refill:  0  . benzonatate (TESSALON) 100 MG capsule    Sig: Take 1 capsule (100 mg total) by mouth every 8 (eight) hours.    Dispense:  30 capsule    Refill:  0  . dexamethasone (DECADRON) 4 MG tablet    Sig: Take 1 tablet (4 mg total) by mouth daily for 7 days.    Dispense:  7 tablet    Refill:  0   Discharge instructions  Get plenty of rest and push fluids Tessalon Perles prescribed for cough Zyrtec for nasal congestion, runny nose, and/or sore throat Flonase for nasal congestion and runny  nose Decadron was prescribed Use medications daily for symptom relief Use OTC medications like ibuprofen or tylenol as needed fever or pain Call or go to the ED if you have any new or worsening symptoms such as fever, worsening cough, shortness of breath, chest tightness, chest pain, turning blue, changes in mental status, etc...   Reviewed expectations re: course of current medical issues. Questions answered. Outlined signs and symptoms indicating need for more acute intervention. Patient verbalized understanding. After Visit Summary given.  Durward Parcel, FNP 02/24/20 [phone]

## 2020-03-08 ENCOUNTER — Encounter: Payer: Self-pay | Admitting: Internal Medicine

## 2020-03-08 ENCOUNTER — Ambulatory Visit (INDEPENDENT_AMBULATORY_CARE_PROVIDER_SITE_OTHER): Payer: Medicaid Other | Admitting: Internal Medicine

## 2020-03-08 ENCOUNTER — Other Ambulatory Visit: Payer: Self-pay

## 2020-03-08 VITALS — BP 133/77 | HR 96 | Temp 97.1°F | Resp 18 | Ht 64.0 in | Wt 285.0 lb

## 2020-03-08 DIAGNOSIS — Z124 Encounter for screening for malignant neoplasm of cervix: Secondary | ICD-10-CM

## 2020-03-08 DIAGNOSIS — Z7689 Persons encountering health services in other specified circumstances: Secondary | ICD-10-CM

## 2020-03-08 DIAGNOSIS — F321 Major depressive disorder, single episode, moderate: Secondary | ICD-10-CM | POA: Insufficient documentation

## 2020-03-08 DIAGNOSIS — E079 Disorder of thyroid, unspecified: Secondary | ICD-10-CM

## 2020-03-08 DIAGNOSIS — Z020 Encounter for examination for admission to educational institution: Secondary | ICD-10-CM

## 2020-03-08 DIAGNOSIS — E0590 Thyrotoxicosis, unspecified without thyrotoxic crisis or storm: Secondary | ICD-10-CM | POA: Insufficient documentation

## 2020-03-08 DIAGNOSIS — F331 Major depressive disorder, recurrent, moderate: Secondary | ICD-10-CM | POA: Insufficient documentation

## 2020-03-08 DIAGNOSIS — O24419 Gestational diabetes mellitus in pregnancy, unspecified control: Secondary | ICD-10-CM | POA: Diagnosis not present

## 2020-03-08 DIAGNOSIS — F320 Major depressive disorder, single episode, mild: Secondary | ICD-10-CM

## 2020-03-08 NOTE — Assessment & Plan Note (Addendum)
Stressed recently due to being a single parent with kids No suicidal or homicidal ideation currently Will check TSH and CMP before starting treatment for depression Lifestyle modifications discussed

## 2020-03-08 NOTE — Progress Notes (Addendum)
New Patient Office Visit  Subjective:  Patient ID: Linda Skinner, female    DOB: 1990/12/19  Age: 29 y.o. MRN: [id number]  CC:  Chief Complaint  Patient presents with   New Patient (Initial Visit)    new pt former health dept pt has some concerns about gaining weight and thyroid. Pt had blood drawn at health dept and was put on thyroid medication. Also will need to see if she has had some shots for school as she has lost her shot record     HPI Linda Skinner is a 29 year old female with past medical history of gestational diabetes mellitus, obesity and unknown thyroid disease presents for establishing care. She was diagnosed with gestational diabetes mellitus, but has not been told about diabetes since then. She states that she has been gaining weight recently and has noticed increase in her appetite. Of note, patient was told that she has thyroid problem and has been taking thyroid medication for last 2 months. She is unsure of the details of the diagnosis as well as the medication. She denies constipation, vision changes, hair loss.  She mentions that she has had less interest in the activities that she used to like and has been feeling down being a single parent with the kids. She has had trouble concentrating at work recently. She denies any problems with sleep. She denies any suicidal or homicidal ideation.  Her last Pap smear was in 05/2015.  She had tubal ligation in 08/2019.  She has had both doses of Covid vaccine, last dose 1 week ago. She also had flu vaccine about week ago.  She requests vaccine titers for her school application. She has not brought the form today signed.  Past Medical History:  Diagnosis Date   Encounter for tubal ligation 09/16/2017   Gestational diabetes    metformin   Maternal varicella, non-immune 02/18/2017   Immunize p delivery   Medical history non-contributory    Supervision of high risk pregnancy, antepartum 02/17/2017    [facility]  Initiated Care at  [facility] Dating By LMP c/w 1st trimester U/S 7wk Pap 05/23/15 neg [facility] (got records) GC/CT Initial:   -/-             36+wks:-/- Genetic Screen NT/IT: neg CF screen declined Anatomic [place] Normal female Flu vaccine 02/17/17  Tdap Recommended ~ 28wks Glucose Screen  2 hr   99/122/114  GDM GBS pos Feed Preference both Contraception BTL, consent 02/06/1   SVD (spontaneous vaginal delivery) 09/15/2017    Past Surgical History:  Procedure Laterality Date   MOUTH SURGERY     TUBAL LIGATION N/A 09/15/2017   Procedure: POST PARTUM TUBAL LIGATION;  Surgeon: Kathrynn Running, MD;  Location: [facility] BIRTHING SUITES;  Service: Gynecology;  Laterality: N/A;   WISDOM TOOTH EXTRACTION      Family History  Problem Relation Age of Onset   Lupus Mother    Cancer Paternal Grandfather    Atrial fibrillation Maternal Grandmother     Social History   Socioeconomic History   Marital status: Single    Spouse name: Not on file   Number of children: Not on file   Years of education: Not on file   Highest education level: Not on file  Occupational History   Not on file  Tobacco Use   Smoking status: Former Smoker    Packs/day: 0.01    Years: 4.00    Pack years: 0.04    Types: Cigars  Smokeless tobacco: Never Used  Vaping Use   Vaping Use: Never used  Substance and Sexual Activity   Alcohol use: No   Drug use: No   Sexual activity: Not Currently    Birth control/protection: None  Other Topics Concern   Not on file  Social History Narrative   Not on file   Social Determinants of Health   Financial Resource Strain:    Difficulty of Paying Living Expenses: Not on file  Food Insecurity:    Worried About Running Out of Food in the Last Year: Not on file   Ran Out of Food in the Last Year: Not on file  Transportation Needs:    Lack of Transportation (Medical): Not on file   Lack of Transportation (Non-Medical): Not on file  Physical Activity:      Days of Exercise per Week: Not on file   Minutes of Exercise per Session: Not on file  Stress:    Feeling of Stress : Not on file  Social Connections:    Frequency of Communication with Friends and Family: Not on file   Frequency of Social Gatherings with Friends and Family: Not on file   Attends Religious Services: Not on file   Active Member of Clubs or Organizations: Not on file   Attends BankerClub or Organization Meetings: Not on file   Marital Status: Not on file  Intimate Partner Violence:    Fear of Current or Ex-Partner: Not on file   Emotionally Abused: Not on file   Physically Abused: Not on file   Sexually Abused: Not on file    ROS Review of Systems  Constitutional: Positive for appetite change. Negative for chills and fever.  HENT: Negative for congestion, sinus pressure, sinus pain and sore throat.   Eyes: Negative for pain and discharge.  Respiratory: Negative for cough and shortness of breath.   Cardiovascular: Negative for chest pain and palpitations.  Gastrointestinal: Negative for abdominal pain, constipation, diarrhea, nausea and vomiting.  Endocrine: Negative for polydipsia and polyuria.  Genitourinary: Negative for dysuria and hematuria.  Musculoskeletal: Negative for neck pain and neck stiffness.  Skin: Negative for rash.  Neurological: Negative for dizziness and weakness.  Psychiatric/Behavioral: Positive for decreased concentration and dysphoric mood. Negative for agitation, sleep disturbance and suicidal ideas.    Objective:   Today's Vitals: BP 133/77 (BP Location: Right Arm, Patient Position: Sitting, Cuff Size: Normal)    Pulse 96    Temp (!) 97.1 F (36.2 C) (Temporal)    Resp 18    Ht 5\' 4"  (1.626 m)    Wt 285 lb (129.3 kg)    SpO2 98%    BMI 48.92 kg/m   Physical Exam Vitals reviewed.  Constitutional:      General: She is not in acute distress.    Appearance: She is obese. She is not diaphoretic.  HENT:     Head: Normocephalic  and atraumatic.     Nose: Nose normal.     Mouth/Throat:     Mouth: Mucous membranes are moist.  Eyes:     General: No scleral icterus.    Extraocular Movements: Extraocular movements intact.     Pupils: Pupils are equal, round, and reactive to light.  Cardiovascular:     Rate and Rhythm: Normal rate and regular rhythm.     Pulses: Normal pulses.     Heart sounds: No murmur heard.   Pulmonary:     Breath sounds: Normal breath sounds. No wheezing or rales.  Abdominal:     Palpations: Abdomen is soft.     Tenderness: There is no abdominal tenderness.  Musculoskeletal:     Cervical back: Neck supple. No tenderness.     Right lower leg: No edema.     Left lower leg: No edema.  Skin:    General: Skin is warm.     Coloration: Skin is not pale.  Neurological:     General: No focal deficit present.     Mental Status: She is alert and oriented to person, place, and time.     Cranial Nerves: No cranial nerve deficit.     Sensory: No sensory deficit.  Psychiatric:        Mood and Affect: Mood normal.        Behavior: Behavior normal.     Assessment & Plan:   Problem List Items Addressed This Visit    Encounter to establish care Care established Previous chart reviewed History and medications reviewed with the patient  Thyroid disease Patient is unsure of diagnosis. Has not brought her medications today. Advised to call with the details Will check TSH as no blood tests since starting thyroid medication   Depression, major, single episode, mild (HCC) Stressed recently due to being a single parent with kids No suicidal or homicidal ideation currently Will check TSH and CMP before starting treatment for depression Lifestyle modifications discussed  Morbid obesity (HCC) Reports recent weight gain Discussed diet modification and moderate exercise Will check TSH  Gestational diabetes mellitus, class A2 H/o gestational diabetes mellitus Will check CMP and HbA1C Advised  low-carbohydrate, low-cholesterol diet   Advised the patient to come on Monday to get PPD testing. Will see after 2 days for PPD results, blood test results and physical exam for school.   Other Visit Diagnoses    Routine cervical smear       Relevant Orders   Ambulatory referral to Obstetrics / Gynecology   School physical exam       Relevant Orders   Measles/Mumps/Rubella Immunity   Varicella zoster antibody, IgG   HBsAb Quant HBIG Assessment      Outpatient Encounter Medications as of 03/08/2020  Medication Sig   cetirizine (ZYRTEC ALLERGY) 10 MG tablet Take 1 tablet (10 mg total) by mouth daily.   Vitamin D, Ergocalciferol, (DRISDOL) 1.25 MG (50000 UNIT) CAPS capsule Take 50,000 Units by mouth once a week.   [DISCONTINUED] benzonatate (TESSALON) 100 MG capsule Take 1 capsule (100 mg total) by mouth every 8 (eight) hours. (Patient not taking: Reported on 03/08/2020)   [DISCONTINUED] docusate sodium (COLACE) 100 MG capsule Take 1 capsule (100 mg total) by mouth 2 (two) times daily as needed for mild constipation or moderate constipation. (Patient not taking: Reported on 10/20/2017)   [DISCONTINUED] doxycycline (VIBRAMYCIN) 100 MG capsule Take 1 capsule (100 mg total) by mouth 2 (two) times daily. One po bid x 7 days (Patient not taking: Reported on 03/08/2020)   [DISCONTINUED] fluticasone (FLONASE) 50 MCG/ACT nasal spray Place 1 spray into both nostrils daily for 14 days. (Patient not taking: Reported on 03/08/2020)   [DISCONTINUED] ibuprofen (ADVIL) 600 MG tablet Take 1 tablet (600 mg total) by mouth every 6 (six) hours as needed. (Patient not taking: Reported on 03/08/2020)   [DISCONTINUED] levofloxacin (LEVAQUIN) 500 MG tablet Take 1 tablet (500 mg total) by mouth daily. (Patient not taking: Reported on 03/08/2020)   [DISCONTINUED] ondansetron (ZOFRAN ODT) 4 MG disintegrating tablet 4mg  ODT q4 hours prn nausea/vomit (Patient not taking: Reported  on 03/08/2020)   [DISCONTINUED]  oxyCODONE-acetaminophen (PERCOCET/ROXICET) 5-325 MG tablet Take 1 tablet by mouth every 4 (four) hours as needed for severe pain. (Patient not taking: Reported on 09/27/2017)   No facility-administered encounter medications on file as of 03/08/2020.    Follow-up: Return in about 1 week (around 03/15/2020).   Anabel Halon, MD

## 2020-03-08 NOTE — Assessment & Plan Note (Signed)
Care established Previous chart reviewed History and medications reviewed with the patient 

## 2020-03-08 NOTE — Assessment & Plan Note (Signed)
Patient is unsure of diagnosis. Has not brought her medications today. Advised to call with the details Will check TSH as no blood tests since starting thyroid medication

## 2020-03-08 NOTE — Assessment & Plan Note (Signed)
Reports recent weight gain Discussed diet modification and moderate exercise Will check TSH

## 2020-03-08 NOTE — Assessment & Plan Note (Signed)
H/o gestational diabetes mellitus Will check CMP and HbA1C Advised low-carbohydrate, low-cholesterol diet

## 2020-03-08 NOTE — Patient Instructions (Signed)
Please get blood test done today and we will discuss the results in the next visit.  You are advised to come to the office on Monday for PPD testing for TB.  Please bring the form for physical exam in the next visit.  You are advised to follow a low carbohydrate, low-cholesterol diet.  Please perform moderate exercise as discussed, at least 150 minutes/week.

## 2020-03-09 LAB — CMP14+EGFR
ALT: 18 IU/L (ref 0–32)
AST: 13 IU/L (ref 0–40)
Albumin/Globulin Ratio: 1.3 (ref 1.2–2.2)
Albumin: 3.9 g/dL (ref 3.9–5.0)
Alkaline Phosphatase: 65 IU/L (ref 44–121)
BUN/Creatinine Ratio: 9 (ref 9–23)
BUN: 6 mg/dL (ref 6–20)
Bilirubin Total: <0.2 mg/dL (ref 0.0–1.2)
CO2: 22 mmol/L (ref 20–29)
Calcium: 9.1 mg/dL (ref 8.7–10.2)
Chloride: 102 mmol/L (ref 96–106)
Creatinine, Ser: 0.67 mg/dL (ref 0.57–1.00)
GFR calc Af Amer: 136 mL/min/{1.73_m2} (ref >59)
GFR calc non Af Amer: 118 mL/min/{1.73_m2} (ref >59)
Globulin, Total: 2.9 g/dL (ref 1.5–4.5)
Glucose: 99 mg/dL (ref 65–99)
Potassium: 4.6 mmol/L (ref 3.5–5.2)
Sodium: 136 mmol/L (ref 134–144)
Total Protein: 6.8 g/dL (ref 6.0–8.5)

## 2020-03-09 LAB — LIPID PANEL
Chol/HDL Ratio: 3.5 ratio (ref 0.0–4.4)
Cholesterol, Total: 186 mg/dL (ref 100–199)
HDL: 53 mg/dL (ref >39)
LDL Chol Calc (NIH): 105 mg/dL — ABNORMAL HIGH (ref 0–99)
Triglycerides: 158 mg/dL — ABNORMAL HIGH (ref 0–149)
VLDL Cholesterol Cal: 28 mg/dL (ref 5–40)

## 2020-03-09 LAB — CBC WITH DIFFERENTIAL/PLATELET
Basophils Absolute: 0 10*3/uL (ref 0.0–0.2)
Basos: 0 %
EOS (ABSOLUTE): 0.2 10*3/uL (ref 0.0–0.4)
Eos: 3 %
Hematocrit: 36.4 % (ref 34.0–46.6)
Hemoglobin: 12 g/dL (ref 11.1–15.9)
Immature Grans (Abs): 0 10*3/uL (ref 0.0–0.1)
Immature Granulocytes: 0 %
Lymphocytes Absolute: 3.8 10*3/uL — ABNORMAL HIGH (ref 0.7–3.1)
Lymphs: 54 %
MCH: 26.4 pg — ABNORMAL LOW (ref 26.6–33.0)
MCHC: 33 g/dL (ref 31.5–35.7)
MCV: 80 fL (ref 79–97)
Monocytes Absolute: 0.4 10*3/uL (ref 0.1–0.9)
Monocytes: 6 %
Neutrophils Absolute: 2.7 10*3/uL (ref 1.4–7.0)
Neutrophils: 37 %
Platelets: 264 10*3/uL (ref 150–450)
RBC: 4.54 x10E6/uL (ref 3.77–5.28)
RDW: 13.2 % (ref 11.7–15.4)
WBC: 7.2 10*3/uL (ref 3.4–10.8)

## 2020-03-09 LAB — MEASLES/MUMPS/RUBELLA IMMUNITY
MUMPS ABS, IGG: 75.7 AU/mL (ref >10.9)
RUBEOLA AB, IGG: >300 AU/mL (ref >16.4)
Rubella Antibodies, IGG: 1.95 index (ref >0.99)

## 2020-03-09 LAB — T4 AND TSH
T4, Total: 6.6 ug/dL (ref 4.5–12.0)
TSH: <0.005 u[IU]/mL — ABNORMAL LOW (ref 0.450–4.500)

## 2020-03-09 LAB — HEMOGLOBIN A1C
Est. average glucose Bld gHb Est-mCnc: 120 mg/dL
Hgb A1c MFr Bld: 5.8 % — ABNORMAL HIGH (ref 4.8–5.6)

## 2020-03-09 LAB — HBSAB QUANT HBIG ASSESSMENT: HBsAb Quant HBIG Assessment: 13.2 m[IU]/mL

## 2020-03-09 LAB — VARICELLA ZOSTER ANTIBODY, IGG: Varicella zoster IgG: <135 index — ABNORMAL LOW (ref >165)

## 2020-03-11 ENCOUNTER — Other Ambulatory Visit: Payer: Self-pay

## 2020-03-11 ENCOUNTER — Ambulatory Visit (INDEPENDENT_AMBULATORY_CARE_PROVIDER_SITE_OTHER): Payer: Medicaid Other

## 2020-03-11 DIAGNOSIS — Z111 Encounter for screening for respiratory tuberculosis: Secondary | ICD-10-CM

## 2020-03-13 ENCOUNTER — Other Ambulatory Visit (HOSPITAL_COMMUNITY)
Admission: RE | Admit: 2020-03-13 | Discharge: 2020-03-13 | Disposition: A | Discharge status: Home / Self Care | Payer: Medicaid Other | Source: Other Acute Inpatient Hospital | Attending: Internal Medicine | Admitting: Internal Medicine

## 2020-03-13 ENCOUNTER — Telehealth: Payer: Self-pay

## 2020-03-13 ENCOUNTER — Encounter: Payer: Self-pay | Admitting: Internal Medicine

## 2020-03-13 ENCOUNTER — Ambulatory Visit (INDEPENDENT_AMBULATORY_CARE_PROVIDER_SITE_OTHER): Payer: Medicaid Other | Admitting: Internal Medicine

## 2020-03-13 ENCOUNTER — Other Ambulatory Visit: Payer: Self-pay

## 2020-03-13 VITALS — BP 143/80 | HR 93 | Temp 97.7°F | Resp 18 | Ht 64.0 in | Wt 283.0 lb

## 2020-03-13 DIAGNOSIS — Z020 Encounter for examination for admission to educational institution: Secondary | ICD-10-CM

## 2020-03-13 DIAGNOSIS — Z131 Encounter for screening for diabetes mellitus: Secondary | ICD-10-CM | POA: Insufficient documentation

## 2020-03-13 DIAGNOSIS — F320 Major depressive disorder, single episode, mild: Secondary | ICD-10-CM

## 2020-03-13 DIAGNOSIS — E0590 Thyrotoxicosis, unspecified without thyrotoxic crisis or storm: Secondary | ICD-10-CM

## 2020-03-13 LAB — TB SKIN TEST
Induration: 0 mm
TB Skin Test: NEGATIVE

## 2020-03-13 NOTE — Assessment & Plan Note (Signed)
Emphasized on diet modification and moderate exercise

## 2020-03-13 NOTE — Patient Instructions (Addendum)
You are advised to follow-up with Dr. Fransico Him for evaluation of thyroid.  We will provide you the school wellness form pending urine test.  Please check with the college for need of meningococcal vaccine.  Please obtain varicella vaccine at the pharmacy or health department.  Please continue to follow heart healthy diet as discussed.  Please try to perform at least 177minutes/week moderate exercise or walking.

## 2020-03-13 NOTE — Assessment & Plan Note (Signed)
Physical exam form filled out and will be provided to the patient pending urine test PPD test negative Vaccine titers reviewed and listed in the form Advised the patient to get Varicella vaccine from pharmacy, clarify with the school for need for meningococcal vaccination No physical or mental restrictions for school admission

## 2020-03-13 NOTE — Assessment & Plan Note (Addendum)
TSH: 0.005 and T4: 6.6 Awaiting free T4, T3 and TRab Currently on Methimazole 5 mg QD, will adjust dose based on pending blood tests Referred to Endocrinology

## 2020-03-13 NOTE — Telephone Encounter (Signed)
Let me see this one.

## 2020-03-13 NOTE — Assessment & Plan Note (Addendum)
Some symptoms of mood changes would be because of hyperthyroidism, undergoing investigation Will observe for now, no suicidal ideation. Patient overall coping well.

## 2020-03-13 NOTE — Progress Notes (Signed)
Established Patient Office Visit  Subjective:  Patient ID: Linda Skinner, female    DOB: 11/28/1990  Age: 29 y.o. MRN: [id number]  CC:  School entrance physical exam paperwork  HPI Linda Skinner is a 29 year old female with gestational diabetes mellitus, obesity and hyperthyroidism presents for physical exam paperwork for school.  Blood test results from the previous visit were discussed with the patient. Vaccine titers were provided in the form.  Patient was advised to get varicella vaccine before submitting the form.  Patient was advised to check with the school regarding the need for meningococcal vaccine.  TSH was noted to be very low and T4 was within normal limits.  Free T4, T3 and TRab tests were added for evaluation of hypothyroidism. Patient is currently on Methimazole 5 mg once daily.  She states that she feels anxious at times due to stress being a single parent, but copes well overall. She denies any suicidal ideation.  Past Medical History:  Diagnosis Date  . Encounter for tubal ligation 09/16/2017  . Gestational diabetes    metformin  . Maternal varicella, non-immune 02/18/2017   Immunize p delivery  . Medical history non-contributory   . Supervision of high risk pregnancy, antepartum 02/17/2017    [facility] at  [facility] [place] Dating By LMP c/w 1st trimester U/S 7wk Pap 05/23/15 neg [facility] (got records) GC/CT Initial:   -/-             36+wks:-/- Genetic Screen NT/IT: neg CF screen declined Anatomic [place] Normal female Flu vaccine 02/17/17  Tdap Recommended ~ 28wks Glucose Screen  2 hr   99/122/114  GDM GBS pos Feed Preference both Contraception BTL, consent 02/06/1  . SVD (spontaneous vaginal delivery) 09/15/2017    Past Surgical History:  Procedure Laterality Date  . MOUTH SURGERY    . TUBAL LIGATION N/A 09/15/2017   Procedure: POST PARTUM TUBAL LIGATION;  Surgeon: Kathrynn Running, MD;  Location: [facility] BIRTHING SUITES;  Service: Gynecology;   Laterality: N/A;  . WISDOM TOOTH EXTRACTION      Family History  Problem Relation Age of Onset  . Lupus Mother   . Cancer Paternal Grandfather   . Atrial fibrillation Maternal Grandmother     Social History   Socioeconomic History  . Marital status: Single    Spouse name: Not on file  . Number of children: Not on file  . Years of education: Not on file  . Highest education level: Not on file  Occupational History  . Not on file  Tobacco Use  . Smoking status: Former Smoker    Packs/day: 0.01    Years: 4.00    Pack years: 0.04    Types: Cigars  . Smokeless tobacco: Never Used  Vaping Use  . Vaping Use: Never used  Substance and Sexual Activity  . Alcohol use: No  . Drug use: No  . Sexual activity: Not Currently    Birth control/protection: None  Other Topics Concern  . Not on file  Social History Narrative  . Not on file   Social Determinants of Health   Financial Resource Strain:   . Difficulty of Paying Living Expenses: Not on file  Food Insecurity:   . Worried About Programme researcher, broadcasting/film/video in the Last Year: Not on file  . Ran Out of Food in the Last Year: Not on file  Transportation Needs:   . Lack of Transportation (Medical): Not on file  . Lack of Transportation (  Non-Medical): Not on file  Physical Activity:   . Days of Exercise per Week: Not on file  . Minutes of Exercise per Session: Not on file  Stress:   . Feeling of Stress : Not on file  Social Connections:   . Frequency of Communication with Friends and Family: Not on file  . Frequency of Social Gatherings with Friends and Family: Not on file  . Attends Religious Services: Not on file  . Active Member of Clubs or Organizations: Not on file  . Attends Banker Meetings: Not on file  . Marital Status: Not on file  Intimate Partner Violence:   . Fear of Current or Ex-Partner: Not on file  . Emotionally Abused: Not on file  . Physically Abused: Not on file  . Sexually Abused: Not on file     Outpatient Medications Prior to Visit  Medication Sig Dispense Refill  . methimazole (TAPAZOLE) 5 MG tablet Take 5 mg by mouth daily.     . cetirizine (ZYRTEC ALLERGY) 10 MG tablet Take 1 tablet (10 mg total) by mouth daily. 30 tablet 0  . Vitamin D, Ergocalciferol, (DRISDOL) 1.25 MG (50000 UNIT) CAPS capsule Take 50,000 Units by mouth once a week.     No facility-administered medications prior to visit.    No Known Allergies  ROS Review of Systems  Constitutional: Positive for appetite change. Negative for chills and fever.  HENT: Negative for congestion, sinus pressure, sinus pain and sore throat.   Eyes: Negative for pain and discharge.  Respiratory: Negative for cough and shortness of breath.   Cardiovascular: Negative for chest pain and palpitations.  Gastrointestinal: Negative for abdominal pain, constipation, diarrhea, nausea and vomiting.  Endocrine: Negative for polydipsia and polyuria.  Genitourinary: Negative for dysuria and hematuria.  Musculoskeletal: Negative for neck pain and neck stiffness.  Skin: Negative for rash.  Neurological: Negative for dizziness and weakness.  Psychiatric/Behavioral: Positive for decreased concentration. Negative for agitation, sleep disturbance and suicidal ideas.      Objective:    Physical Exam Vitals reviewed.  Constitutional:      General: She is not in acute distress.    Appearance: She is obese. She is not diaphoretic.  HENT:     Head: Normocephalic and atraumatic.     Nose: Nose normal.     Mouth/Throat:     Mouth: Mucous membranes are moist.  Eyes:     General: No scleral icterus.    Extraocular Movements: Extraocular movements intact.     Pupils: Pupils are equal, round, and reactive to light.  Cardiovascular:     Rate and Rhythm: Normal rate and regular rhythm.     Pulses: Normal pulses.     Heart sounds: No murmur heard.   Pulmonary:     Breath sounds: Normal breath sounds. No wheezing or rales.  Abdominal:       Palpations: Abdomen is soft.     Tenderness: There is no abdominal tenderness.  Musculoskeletal:     Cervical back: Normal range of motion and neck supple. No rigidity or tenderness.     Right lower leg: No edema.     Left lower leg: No edema.  Skin:    General: Skin is warm.     Coloration: Skin is not pale.  Neurological:     General: No focal deficit present.     Mental Status: She is alert and oriented to person, place, and time.     Cranial Nerves: No cranial nerve  deficit.     Sensory: No sensory deficit.  Psychiatric:        Mood and Affect: Mood normal.        Behavior: Behavior normal.      BP (!) 143/80 (BP Location: Right Arm, Patient Position: Sitting, Cuff Size: Normal)   Pulse 93   Temp 97.7 F (36.5 C) (Temporal)   Resp 18   Ht 5\' 4"  (1.626 m)   Wt 283 lb (128.4 kg)   SpO2 98%   BMI 48.58 kg/m  Wt Readings from Last 3 Encounters:  03/13/20 283 lb (128.4 kg)  03/08/20 285 lb (129.3 kg)  06/03/19 243 lb (110.2 kg)     Health Maintenance Due  Topic Date Due  . Hepatitis C Screening  Never done  . PAP SMEAR-Modifier  05/22/2018    There are no preventive care reminders to display for this patient.  Lab Results  Component Value Date   TSH <0.005 (L) 03/08/2020   Lab Results  Component Value Date   WBC 7.2 03/08/2020   HGB 12.0 03/08/2020   HCT 36.4 03/08/2020   MCV 80 03/08/2020   PLT 264 03/08/2020   Lab Results  Component Value Date   NA 136 03/08/2020   K 4.6 03/08/2020   CO2 22 03/08/2020   GLUCOSE 99 03/08/2020   BUN 6 03/08/2020   CREATININE 0.67 03/08/2020   BILITOT <0.2 03/08/2020   ALKPHOS 65 03/08/2020   AST 13 03/08/2020   ALT 18 03/08/2020   PROT 6.8 03/08/2020   ALBUMIN 3.9 03/08/2020   CALCIUM 9.1 03/08/2020   ANIONGAP 9 05/05/2018   Lab Results  Component Value Date   CHOL 186 03/08/2020   Lab Results  Component Value Date   HDL 53 03/08/2020   Lab Results  Component Value Date   LDLCALC 105 (H)  03/08/2020   Lab Results  Component Value Date   TRIG 158 (H) 03/08/2020   Lab Results  Component Value Date   CHOLHDL 3.5 03/08/2020   Lab Results  Component Value Date   HGBA1C 5.8 (H) 03/08/2020      Assessment & Plan:   School physical exam Physical exam form filled out and will be provided to the patient pending urine test PPD test negative Vaccine titers reviewed and listed in the form Advised the patient to get Varicella vaccine from pharmacy, clarify with the school for need for meningococcal vaccination No physical or mental restrictions for school admission  Hyperthyroidism TSH: 0.005 and T4: 6.6 Awaiting free T4, T3 and TRab Currently on Methimazole 5 mg QD, will adjust dose based on pending blood tests Referred to Endocrinology  Depression, major, single episode, mild (HCC) Some symptoms of mood changes would be because of hyperthyroidism, undergoing investigation Will observe for now, no suicidal ideation. Patient overall coping well.  Morbid obesity (HCC) Emphasized on diet modification and moderate exercise    No orders of the defined types were placed in this encounter.   Follow-up: Return in about 3 months (around 06/13/2020).    06/15/2020, MD

## 2020-03-13 NOTE — Telephone Encounter (Signed)
Can this pt see Whitney? 

## 2020-03-14 LAB — TSH+FREE T4
Free T4: 1.41 ng/dL (ref 0.82–1.77)
TSH: <0.005 u[IU]/mL — ABNORMAL LOW (ref 0.450–4.500)

## 2020-03-14 LAB — T3, FREE: T3, Free: 4.4 pg/mL (ref 2.0–4.4)

## 2020-03-14 LAB — SPECIMEN STATUS REPORT

## 2020-03-14 LAB — MICROALBUMIN, URINE: Microalb, Ur: 13.7 ug/mL — ABNORMAL HIGH

## 2020-03-25 ENCOUNTER — Other Ambulatory Visit: Payer: Self-pay

## 2020-03-25 ENCOUNTER — Ambulatory Visit (INDEPENDENT_AMBULATORY_CARE_PROVIDER_SITE_OTHER): Payer: Medicaid Other

## 2020-03-25 VITALS — BP 141/80 | HR 74 | Ht 64.0 in | Wt 282.0 lb

## 2020-03-25 DIAGNOSIS — Z111 Encounter for screening for respiratory tuberculosis: Secondary | ICD-10-CM

## 2020-03-25 DIAGNOSIS — Z0000 Encounter for general adult medical examination without abnormal findings: Secondary | ICD-10-CM | POA: Diagnosis not present

## 2020-03-26 ENCOUNTER — Encounter: Payer: Medicaid Other | Admitting: Adult Health

## 2020-03-27 ENCOUNTER — Other Ambulatory Visit: Payer: Self-pay

## 2020-03-27 LAB — TB SKIN TEST: TB Skin Test: NEGATIVE

## 2020-03-28 ENCOUNTER — Encounter: Payer: Self-pay | Admitting: "Endocrinology

## 2020-03-28 ENCOUNTER — Other Ambulatory Visit: Payer: Self-pay

## 2020-03-28 ENCOUNTER — Ambulatory Visit (INDEPENDENT_AMBULATORY_CARE_PROVIDER_SITE_OTHER): Payer: Medicaid Other | Admitting: "Endocrinology

## 2020-03-28 VITALS — BP 141/80 | HR 90 | Ht 64.0 in | Wt 282.0 lb

## 2020-03-28 DIAGNOSIS — E0590 Thyrotoxicosis, unspecified without thyrotoxic crisis or storm: Secondary | ICD-10-CM | POA: Diagnosis not present

## 2020-03-28 NOTE — Progress Notes (Signed)
03/28/2020     Endocrinology Consult Note    Subjective:    Patient ID: Linda Skinner, female    DOB: July 28, 1990, PCP Linda Halon, MD.   Past Medical History:  Diagnosis Date  . Encounter for tubal ligation 09/16/2017  . Gestational diabetes    metformin  . Maternal varicella, non-immune 02/18/2017   Immunize p delivery  . Medical history non-contributory   . Supervision of high risk pregnancy, antepartum 02/17/2017    Clinic [facility] Initiated Care at  [facility] FOB Linda Skinner Dating By LMP c/w 1st trimester U/S 7wk Pap 05/23/15 neg [facility] (got records) GC/CT Initial:   -/-             36+wks:-/- Genetic Screen NT/IT: neg CF screen declined Anatomic [place] Normal female Flu vaccine 02/17/17  Tdap Recommended ~ 28wks Glucose Screen  2 hr   99/122/114  GDM GBS pos Feed Preference both Contraception BTL, consent 02/06/1  . SVD (spontaneous vaginal delivery) 09/15/2017    Past Surgical History:  Procedure Laterality Date  . MOUTH SURGERY    . TUBAL LIGATION N/A 09/15/2017   Procedure: POST PARTUM TUBAL LIGATION;  Surgeon: Kathrynn Running, MD;  Location: [facility] BIRTHING SUITES;  Service: Gynecology;  Laterality: N/A;  . WISDOM TOOTH EXTRACTION      Social History   Socioeconomic History  . Marital status: Single    Spouse name: Not on file  . Number of children: Not on file  . Years of education: Not on file  . Highest education level: Not on file  Occupational History  . Not on file  Tobacco Use  . Smoking status: Former Smoker    Packs/day: 0.01    Years: 4.00    Pack years: 0.04    Types: Cigars  . Smokeless tobacco: Never Used  Vaping Use  . Vaping Use: Never used  Substance and Sexual Activity  . Alcohol use: No  . Drug use: No  . Sexual activity: Not Currently    Birth control/protection: None  Other Topics Concern  . Not on file  Social History Narrative  . Not on file   Social Determinants of Health   Financial Resource Strain:   . Difficulty of  Paying Living Expenses: Not on file  Food Insecurity:   . Worried About Programme researcher, broadcasting/film/video in the Last Year: Not on file  . Ran Out of Food in the Last Year: Not on file  Transportation Needs:   . Lack of Transportation (Medical): Not on file  . Lack of Transportation (Non-Medical): Not on file  Physical Activity:   . Days of Exercise per Week: Not on file  . Minutes of Exercise per Session: Not on file  Stress:   . Feeling of Stress : Not on file  Social Connections:   . Frequency of Communication with Friends and Family: Not on file  . Frequency of Social Gatherings with Friends and Family: Not on file  . Attends Religious Services: Not on file  . Active Member of Clubs or Organizations: Not on file  . Attends Banker Meetings: Not on file  . Marital Status: Not on file    Family History  Problem Relation Age of Onset  . Lupus Mother   . Cancer Paternal Grandfather   . Atrial fibrillation Maternal Grandmother     Outpatient Encounter Medications as of 03/28/2020  Medication Sig  . [DISCONTINUED] methimazole (TAPAZOLE) 5 MG tablet Take 5 mg  by mouth daily.   . cetirizine (ZYRTEC ALLERGY) 10 MG tablet Take 1 tablet (10 mg total) by mouth daily. (Patient not taking: Reported on 03/28/2020)  . Vitamin D, Ergocalciferol, (DRISDOL) 1.25 MG (50000 UNIT) CAPS capsule Take 50,000 Units by mouth once a week. (Patient not taking: Reported on 03/28/2020)   No facility-administered encounter medications on file as of 03/28/2020.    ALLERGIES: No Known Allergies  VACCINATION STATUS: Immunization History  Administered Date(s) Administered  . Influenza,inj,Quad PF,6+ Mos 02/17/2017, 03/01/2020  . PFIZER SARS-COV-2 Vaccination 02/07/2020, 03/01/2020  . PPD Test 03/11/2020, 03/25/2020  . Tdap 09/16/2017     HPI  Linda Skinner is 29 y.o. female who presents today with a medical history as above. she is being seen in consultation for hyperthyroidism requested by Linda Halon, MD.  she has been dealing with symptoms of weight loss, palpitations, tremors, and fatigue for several weeks.  On October 8 she underwent thyroid function test which was consistent with hyperthyroidism.  She was put on methimazole currently 5 mg p.o. daily.  She tolerates this medication very well.  She describes subsequent weight gain, fatigue.  she denies dysphagia, choking, shortness of breath, no recent voice change.    she denies family history of thyroid dysfunction. -She denies family hx of thyroid cancer. she denies personal history of goiter.she  is willing to proceed with appropriate work up and therapy for thyrotoxicosis.                           Review of systems  Constitutional: + weight loss, + fatigue, + subjective hyperthermia Eyes: no blurry vision, -  xerophthalmia ENT: no sore throat, no nodules palpated in throat, no dysphagia/odynophagia, nor hoarseness Cardiovascular: no Chest Pain, no Shortness of Breath, +  palpitations, no leg swelling Respiratory: no cough, no SOB Gastrointestinal: no Nausea, no Vomiting, no Diarhhea Musculoskeletal: no muscle/joint aches Skin: no rashes Neurological: +  tremors, no numbness, no tingling, no dizziness Psychiatric: no depression, +  anxiety   Objective:    BP (!) 141/80   Pulse 90   Ht 5\' 4"  (1.626 m)   Wt 282 lb (127.9 kg)   SpO2 96%   BMI 48.41 kg/m   Wt Readings from Last 3 Encounters:  03/28/20 282 lb (127.9 kg)  03/25/20 282 lb (127.9 kg)  03/13/20 283 lb (128.4 kg)                                                Physical exam  Constitutional: Body mass index is 48.41 kg/m., not in acute distress, + normal state of mind Eyes: PERRLA, EOMI, - exophthalmos ENT: moist mucous membranes, +  thyromegaly, no cervical lymphadenopathy Cardiovascular: - precordial activity, -tachycardic,  no Murmur/Rubs/Gallops Respiratory:  adequate breathing efforts, no gross chest deformity, Clear to auscultation  bilaterally Gastrointestinal: abdomen soft, Non -tender, No distension, Bowel Sounds present Musculoskeletal: no gross deformities, strength intact in all four extremities Skin: moist, warm, no rashes Neurological: +  tremor with outstretched hands,  + Deep Tendon Reflexes  on both lower extremities.   CMP     Component Value Date/Time   NA 136 03/08/2020 0919   K 4.6 03/08/2020 0919   CL 102 03/08/2020 0919   CO2 22 03/08/2020 0919   GLUCOSE 99 03/08/2020 0919  GLUCOSE 108 (H) 05/05/2018 0750   BUN 6 03/08/2020 0919   CREATININE 0.67 03/08/2020 0919   CALCIUM 9.1 03/08/2020 0919   PROT 6.8 03/08/2020 0919   ALBUMIN 3.9 03/08/2020 0919   AST 13 03/08/2020 0919   ALT 18 03/08/2020 0919   ALKPHOS 65 03/08/2020 0919   BILITOT <0.2 03/08/2020 0919   GFRNONAA 118 03/08/2020 0919   GFRAA 136 03/08/2020 0919     CBC    Component Value Date/Time   WBC 7.2 03/08/2020 0919   WBC 10.3 05/05/2018 0750   RBC 4.54 03/08/2020 0919   RBC 4.48 05/05/2018 0750   HGB 12.0 03/08/2020 0919   HCT 36.4 03/08/2020 0919   PLT 264 03/08/2020 0919   MCV 80 03/08/2020 0919   MCH 26.4 (L) 03/08/2020 0919   MCH 25.4 (L) 05/05/2018 0750   MCHC 33.0 03/08/2020 0919   MCHC 31.8 05/05/2018 0750   RDW 13.2 03/08/2020 0919   LYMPHSABS 3.8 (H) 03/08/2020 0919   MONOABS 0.9 05/05/2018 0750   EOSABS 0.2 03/08/2020 0919   BASOSABS 0.0 03/08/2020 0919     Diabetic Labs (most recent): Lab Results  Component Value Date   HGBA1C 5.8 (H) 03/08/2020    Lipid Panel     Component Value Date/Time   CHOL 186 03/08/2020 0919   TRIG 158 (H) 03/08/2020 0919   HDL 53 03/08/2020 0919   CHOLHDL 3.5 03/08/2020 0919   LDLCALC 105 (H) 03/08/2020 0919   LABVLDL 28 03/08/2020 0919     Lab Results  Component Value Date   TSH <0.005 (L) 03/08/2020   TSH <0.005 (L) 03/08/2020   FREET4 1.41 03/08/2020        Assessment & Plan:   1. Hyperthyroidism  she is being seen at a kind request of Linda HalonPatel,  Rutwik K, MD. her history and most recent labs are reviewed, and she was examined clinically. Subjective and objective findings are consistent with thyrotoxicosis likely from primary hyperthyroidism. The potential risks of untreated thyrotoxicosis and the need for definitive therapy have been discussed in detail with her, and she agrees to proceed with diagnostic workup and treatment plan.   I like to obtain confirmatory thyroid uptake and scan which will be scheduled to be done as soon as possible.  She will have to be off of her methimazole for 5 days prior to performing this study.    Options of therapy are discussed with her.  We discussed the option of treating it with medications including methimazole or PTU which may have side effects including rash, transaminitis, and bone marrow suppression.  We  also discussed the option of definitive therapy with RAI ablation of the thyroid.   If  she is found to have primary hyperthyroidism from Graves' disease , toxic multinodular goiter or toxic nodular goiter the preferred modality of treatment would be I-131 thyroid ablation.    -Patient is made aware of the high likelihood of post ablative hypothyroidism with subsequent need for lifelong thyroid hormone replacement. she understands this outcome  and she is  willing to proceed.  Although surgery is one other choice of treatment in some cases, in her case surgery is not a good fit for presentation with only mild goiter.    she will return in 10 days for treatment decision.   I advised her to D/C methimazole for study, and to clear way for RAI treatment if necessary during her next visit.  Her pulse rate is 90, did not initiate beta-blockers.    -  Patient is advised to maintain close follow up with Linda Halon, MD for primary care needs.   - Time spent with the patient: 60 minutes, of which >50% was spent in obtaining information about her symptoms, reviewing her previous labs, evaluations, and  treatments, counseling her about her primary hyperthyroidism, and developing a plan to confirm the diagnosis and long term treatment as necessary. Please refer to " Patient Self Inventory" in the Media  tab for reviewed elements of pertinent patient history.  Linda Skinner participated in the discussions, expressed understanding, and voiced agreement with the above plans.  All questions were answered to her satisfaction. she is encouraged to contact clinic should she have any questions or concerns prior to her return visit.   Follow up plan: Return in about 10 days (around 04/07/2020) for F/U with Thyroid Uptake and Scan.   Thank you for involving me in the care of this pleasant patient, and I will continue to update you with her progress.  Marquis Lunch, MD [facility] Phone: [phone]  Fax: [phone]   03/28/2020, 11:39 AM  This note was partially dictated with voice recognition software. Similar sounding words can be transcribed inadequately or may not  be corrected upon review.

## 2020-04-08 ENCOUNTER — Ambulatory Visit: Payer: Medicaid Other | Admitting: "Endocrinology

## 2020-04-09 ENCOUNTER — Encounter (HOSPITAL_COMMUNITY): Payer: Self-pay

## 2020-04-09 ENCOUNTER — Other Ambulatory Visit: Payer: Self-pay

## 2020-04-09 ENCOUNTER — Encounter (HOSPITAL_COMMUNITY)
Admission: RE | Admit: 2020-04-09 | Discharge: 2020-04-09 | Disposition: A | Discharge status: Home / Self Care | Payer: Medicaid Other | Source: Ambulatory Visit | Attending: "Endocrinology | Admitting: "Endocrinology

## 2020-04-09 DIAGNOSIS — E0590 Thyrotoxicosis, unspecified without thyrotoxic crisis or storm: Secondary | ICD-10-CM | POA: Diagnosis not present

## 2020-04-09 MED ORDER — SODIUM IODIDE I-123 7.4 MBQ CAPS
500.0000 | ORAL_CAPSULE | Freq: Once | ORAL | Status: AC
  Administered 2020-04-09: 451 via ORAL

## 2020-04-10 ENCOUNTER — Encounter (HOSPITAL_COMMUNITY)
Admission: RE | Admit: 2020-04-10 | Discharge: 2020-04-10 | Disposition: A | Discharge status: Home / Self Care | Payer: Medicaid Other | Source: Ambulatory Visit | Attending: "Endocrinology | Admitting: "Endocrinology

## 2020-04-10 DIAGNOSIS — E0590 Thyrotoxicosis, unspecified without thyrotoxic crisis or storm: Secondary | ICD-10-CM | POA: Insufficient documentation

## 2020-04-12 ENCOUNTER — Other Ambulatory Visit: Payer: Self-pay

## 2020-04-12 ENCOUNTER — Encounter: Payer: Self-pay | Admitting: "Endocrinology

## 2020-04-12 ENCOUNTER — Ambulatory Visit (INDEPENDENT_AMBULATORY_CARE_PROVIDER_SITE_OTHER): Payer: Medicaid Other | Admitting: "Endocrinology

## 2020-04-12 VITALS — BP 116/70 | HR 92 | Ht 64.0 in | Wt 285.2 lb

## 2020-04-12 DIAGNOSIS — E0590 Thyrotoxicosis, unspecified without thyrotoxic crisis or storm: Secondary | ICD-10-CM | POA: Diagnosis not present

## 2020-04-12 DIAGNOSIS — E0500 Thyrotoxicosis with diffuse goiter without thyrotoxic crisis or storm: Secondary | ICD-10-CM | POA: Diagnosis not present

## 2020-04-12 NOTE — Progress Notes (Signed)
04/12/2020      Endocrinology follow-up note   Subjective:    Patient ID: Linda Skinner, female    DOB: Oct 15, 1990, PCP Anabel Halon, MD.   Past Medical History:  Diagnosis Date  . Encounter for tubal ligation 09/16/2017  . Gestational diabetes    metformin  . Maternal varicella, non-immune 02/18/2017   Immunize p delivery  . Medical history non-contributory   . Supervision of high risk pregnancy, antepartum 02/17/2017    Clinic [facility] Initiated Care at  [facility] FOB Casimiro Needle Poteat Dating By LMP c/w 1st trimester U/S 7wk Pap 05/23/15 neg [facility] (got records) GC/CT Initial:   -/-             36+wks:-/- Genetic Screen NT/IT: neg CF screen declined Anatomic [place] Normal female Flu vaccine 02/17/17  Tdap Recommended ~ 28wks Glucose Screen  2 hr   99/122/114  GDM GBS pos Feed Preference both Contraception BTL, consent 02/06/1  . SVD (spontaneous vaginal delivery) 09/15/2017    Past Surgical History:  Procedure Laterality Date  . MOUTH SURGERY    . TUBAL LIGATION N/A 09/15/2017   Procedure: POST PARTUM TUBAL LIGATION;  Surgeon: Kathrynn Running, MD;  Location: [facility] BIRTHING SUITES;  Service: Gynecology;  Laterality: N/A;  . WISDOM TOOTH EXTRACTION      Social History   Socioeconomic History  . Marital status: Single    Spouse name: Not on file  . Number of children: Not on file  . Years of education: Not on file  . Highest education level: Not on file  Occupational History  . Not on file  Tobacco Use  . Smoking status: Former Smoker    Packs/day: 0.01    Years: 4.00    Pack years: 0.04    Types: Cigars  . Smokeless tobacco: Never Used  Vaping Use  . Vaping Use: Never used  Substance and Sexual Activity  . Alcohol use: No  . Drug use: No  . Sexual activity: Not Currently    Birth control/protection: None  Other Topics Concern  . Not on file  Social History Narrative  . Not on file   Social Determinants of Health   Financial Resource Strain:   . Difficulty of  Paying Living Expenses: Not on file  Food Insecurity:   . Worried About Programme researcher, broadcasting/film/video in the Last Year: Not on file  . Ran Out of Food in the Last Year: Not on file  Transportation Needs:   . Lack of Transportation (Medical): Not on file  . Lack of Transportation (Non-Medical): Not on file  Physical Activity:   . Days of Exercise per Week: Not on file  . Minutes of Exercise per Session: Not on file  Stress:   . Feeling of Stress : Not on file  Social Connections:   . Frequency of Communication with Friends and Family: Not on file  . Frequency of Social Gatherings with Friends and Family: Not on file  . Attends Religious Services: Not on file  . Active Member of Clubs or Organizations: Not on file  . Attends Banker Meetings: Not on file  . Marital Status: Not on file    Family History  Problem Relation Age of Onset  . Lupus Mother   . Cancer Paternal Grandfather   . Atrial fibrillation Maternal Grandmother     Outpatient Encounter Medications as of 04/12/2020  Medication Sig  . [DISCONTINUED] cetirizine (ZYRTEC ALLERGY) 10 MG tablet Take 1  tablet (10 mg total) by mouth daily. (Patient not taking: Reported on 03/28/2020)  . [DISCONTINUED] Vitamin D, Ergocalciferol, (DRISDOL) 1.25 MG (50000 UNIT) CAPS capsule Take 50,000 Units by mouth once a week. (Patient not taking: Reported on 03/28/2020)   No facility-administered encounter medications on file as of 04/12/2020.    ALLERGIES: No Known Allergies  VACCINATION STATUS: Immunization History  Administered Date(s) Administered  . Influenza,inj,Quad PF,6+ Mos 02/17/2017, 03/01/2020  . PFIZER SARS-COV-2 Vaccination 02/07/2020, 03/01/2020  . PPD Test 03/11/2020, 03/25/2020  . Tdap 09/16/2017     HPI  Linda Skinner is 29 y.o. female who presents today with her new labs and uptake and scan of her thyroid.    she was recently seen in consultation for hyperthyroidism requested by Anabel Halon, MD.  she  has been dealing with symptoms of weight loss, palpitations, tremors, and fatigue for several weeks.  On October 8 she underwent thyroid function test which was consistent with hyperthyroidism.  She was briefly treated with methimazole until she underwent thyroid uptake and scan.  Her subsequent labs are still significant for primary hyperthyroidism, uptake was homogeneous suggesting Graves' disease at 46% in 24 hours.  she denies dysphagia, choking, shortness of breath, no recent voice change.    she denies family history of thyroid dysfunction. -She denies family hx of thyroid cancer. she denies personal history of goiter.she  is willing to proceed with appropriate work up and therapy for thyrotoxicosis.                           Review of systems  Constitutional: + fluctuating body weight, + fatigue, + subjective hyperthermia Eyes: no blurry vision, -  xerophthalmia ENT: no sore throat, no nodules palpated in throat, no dysphagia/odynophagia, nor hoarseness Cardiovascular: no Chest Pain, no Shortness of Breath, +  palpitations, no leg swelling Respiratory: no cough, no SOB Gastrointestinal: no Nausea, no Vomiting, no Diarhhea Musculoskeletal: no muscle/joint aches Skin: no rashes Neurological: +  tremors, no numbness, no tingling, no dizziness Psychiatric: no depression, +  anxiety   Objective:    BP 116/70   Pulse 92   Ht 5\' 4"  (1.626 m)   Wt 285 lb 3.2 oz (129.4 kg)   LMP 03/22/2020 (Within Weeks) Comment: Tubal Ligation  BMI 48.95 kg/m   Wt Readings from Last 3 Encounters:  04/12/20 285 lb 3.2 oz (129.4 kg)  03/28/20 282 lb (127.9 kg)  03/25/20 282 lb (127.9 kg)                                                Physical exam  Constitutional: Body mass index is 48.95 kg/m., not in acute distress, + normal state of mind Eyes: PERRLA, EOMI, - exophthalmos ENT: moist mucous membranes, +  thyromegaly, no cervical lymphadenopathy Cardiovascular: - precordial activity,  -tachycardic,  no Murmur/Rubs/Gallops Respiratory:  adequate breathing efforts, no gross chest deformity, Clear to auscultation bilaterally Gastrointestinal: abdomen soft, Non -tender, No distension, Bowel Sounds present Musculoskeletal: no gross deformities, strength intact in all four extremities Skin: moist, warm, no rashes Neurological: +  tremor with outstretched hands,  + Deep Tendon Reflexes  on both lower extremities.   CMP     Component Value Date/Time   NA 136 03/08/2020 0919   K 4.6 03/08/2020 0919   CL 102 03/08/2020 0919  CO2 22 03/08/2020 0919   GLUCOSE 99 03/08/2020 0919   GLUCOSE 108 (H) 05/05/2018 0750   BUN 6 03/08/2020 0919   CREATININE 0.67 03/08/2020 0919   CALCIUM 9.1 03/08/2020 0919   PROT 6.8 03/08/2020 0919   ALBUMIN 3.9 03/08/2020 0919   AST 13 03/08/2020 0919   ALT 18 03/08/2020 0919   ALKPHOS 65 03/08/2020 0919   BILITOT <0.2 03/08/2020 0919   GFRNONAA 118 03/08/2020 0919   GFRAA 136 03/08/2020 0919     CBC    Component Value Date/Time   WBC 7.2 03/08/2020 0919   WBC 10.3 05/05/2018 0750   RBC 4.54 03/08/2020 0919   RBC 4.48 05/05/2018 0750   HGB 12.0 03/08/2020 0919   HCT 36.4 03/08/2020 0919   PLT 264 03/08/2020 0919   MCV 80 03/08/2020 0919   MCH 26.4 (L) 03/08/2020 0919   MCH 25.4 (L) 05/05/2018 0750   MCHC 33.0 03/08/2020 0919   MCHC 31.8 05/05/2018 0750   RDW 13.2 03/08/2020 0919   LYMPHSABS 3.8 (H) 03/08/2020 0919   MONOABS 0.9 05/05/2018 0750   EOSABS 0.2 03/08/2020 0919   BASOSABS 0.0 03/08/2020 0919     Diabetic Labs (most recent): Lab Results  Component Value Date   HGBA1C 5.8 (H) 03/08/2020    Lipid Panel     Component Value Date/Time   CHOL 186 03/08/2020 0919   TRIG 158 (H) 03/08/2020 0919   HDL 53 03/08/2020 0919   CHOLHDL 3.5 03/08/2020 0919   LDLCALC 105 (H) 03/08/2020 0919   LABVLDL 28 03/08/2020 0919     Lab Results  Component Value Date   TSH <0.005 (L) 03/08/2020   TSH <0.005 (L) 03/08/2020    FREET4 1.41 03/08/2020    Thyroid uptake and scan on April 10, 2020 FINDINGS: Homogeneous tracer distribution in both thyroid lobes. No focal areas of increased or decreased tracer localization. 4 hour I-123 uptake = 23.7% (normal 5-20%)  ,  24 hour I-123 uptake = 46.9% (normal 10-30%)  IMPRESSION: Normal thyroid scan. Elevated 4 hour and 24 hour radio iodine uptakes. Findings consistent with Graves disease.   Assessment & Plan:   1. Hyperthyroidism 2.  Graves' disease  -Her interval studies and thyroid uptake and scan were reviewed with her.  She does have primary hyperthyroidism secondary to Graves' disease.  She had only partial response with methimazole.  The potential risks of untreated thyrotoxicosis and the need for definitive therapy have been discussed in detail with her, and she agrees to proceed withtreatment plan.   Options of therapy are discussed with her.  We discussed the option of treating it with medications including methimazole or PTU which may have side effects including rash, transaminitis, and bone marrow suppression.  We  also discussed the option of definitive therapy with RAI ablation of the thyroid.   She agrees with my suggestion of treating it with radioactive iodine ablation.  This treatment will be scheduled to be administered in [facility] soon as possible. -Patient is made aware of the high likelihood of post ablative hypothyroidism with subsequent need for lifelong thyroid hormone replacement. she understands this outcome  and she is  willing to proceed.  Although surgery is one other choice of treatment in some cases, in her case surgery is not a good fit for presentation with only mild goiter.    she will return in 9 weeks with repeat thyroid function test to assess treatment response and hormone replacement if needed.      -  Patient is advised to maintain close follow up with Anabel HalonPatel, Rutwik K, MD for primary care needs.      - Time  spent on this patient care encounter:  20 minutes of which 50% was spent in  counseling and the rest reviewing  her current and  previous labs / studies and medications  doses and developing a plan for long term care. Linda KannerStacey Klich  participated in the discussions, expressed understanding, and voiced agreement with the above plans.  All questions were answered to her satisfaction. she is encouraged to contact clinic should she have any questions or concerns prior to her return visit.   Follow up plan: Return in about 9 weeks (around 06/14/2020) for F/U with Labs after I131 Therapy.   Thank you for involving me in the care of this pleasant patient, and I will continue to update you with her progress.  Marquis LunchGebre Khiya Friese, MD [facility] Phone: [phone]  Fax: [phone]   04/12/2020, 12:54 PM  This note was partially dictated with voice recognition software. Similar sounding words can be transcribed inadequately or may not  be corrected upon review.

## 2020-04-15 NOTE — Progress Notes (Signed)
Pt letter printed.

## 2020-04-19 ENCOUNTER — Other Ambulatory Visit: Payer: Self-pay

## 2020-04-19 ENCOUNTER — Encounter (HOSPITAL_COMMUNITY)
Admission: RE | Admit: 2020-04-19 | Discharge: 2020-04-19 | Disposition: A | Discharge status: Home / Self Care | Payer: Medicaid Other | Source: Ambulatory Visit | Attending: "Endocrinology | Admitting: "Endocrinology

## 2020-04-19 ENCOUNTER — Encounter (HOSPITAL_COMMUNITY): Payer: Self-pay

## 2020-04-19 DIAGNOSIS — E0590 Thyrotoxicosis, unspecified without thyrotoxic crisis or storm: Secondary | ICD-10-CM | POA: Diagnosis not present

## 2020-04-19 MED ORDER — SODIUM IODIDE I 131 CAPSULE
15.0000 | Freq: Once | INTRAVENOUS | Status: AC | PRN
  Administered 2020-04-19: 13.7 via ORAL

## 2020-04-22 ENCOUNTER — Encounter: Payer: Medicaid Other | Admitting: Adult Health

## 2020-05-01 ENCOUNTER — Ambulatory Visit (INDEPENDENT_AMBULATORY_CARE_PROVIDER_SITE_OTHER): Payer: Medicaid Other | Admitting: Adult Health

## 2020-05-01 ENCOUNTER — Encounter: Payer: Self-pay | Admitting: Adult Health

## 2020-05-01 ENCOUNTER — Other Ambulatory Visit: Payer: Self-pay

## 2020-05-01 ENCOUNTER — Other Ambulatory Visit (HOSPITAL_COMMUNITY)
Admission: RE | Admit: 2020-05-01 | Discharge: 2020-05-01 | Disposition: A | Discharge status: Home / Self Care | Payer: Medicaid Other | Source: Ambulatory Visit | Attending: Adult Health | Admitting: Adult Health

## 2020-05-01 VITALS — BP 140/88 | HR 80 | Ht 66.0 in | Wt 287.5 lb

## 2020-05-01 DIAGNOSIS — F32A Depression, unspecified: Secondary | ICD-10-CM

## 2020-05-01 DIAGNOSIS — Z01419 Encounter for gynecological examination (general) (routine) without abnormal findings: Secondary | ICD-10-CM | POA: Diagnosis not present

## 2020-05-01 DIAGNOSIS — Z0000 Encounter for general adult medical examination without abnormal findings: Secondary | ICD-10-CM | POA: Diagnosis present

## 2020-05-01 MED ORDER — ESCITALOPRAM OXALATE 10 MG PO TABS: 10.0000 mg | ORAL_TABLET | Freq: Every day | ORAL | 2 refills | Status: DC

## 2020-05-01 NOTE — Progress Notes (Signed)
  Subjective:     Patient ID: Linda Skinner, female   DOB: 31-Jan-1991, 29 y.o.   MRN: [id number]  HPI Linda Skinner is a 29 year old black female,single [phone] in for a pelvic and pap.  PCP is Dr Allena Katz.  Review of Systems Patient denies any headaches, hearing loss, fatigue, blurred vision, shortness of breath, chest pain, abdominal pain, problems with bowel movements, urination, or intercourse(not currently active). No joint pain or mood swings.    Reviewed past medical,surgical, social and family history. Reviewed medications and allergies.  Objective:   Physical Exam BP 140/88 (BP Location: Right Arm, Patient Position: Sitting, Cuff Size: Large)   Pulse 80   Ht 5\' 6"  (1.676 m)   Wt 287 lb 8 oz (130.4 kg)   LMP 04/20/2020 (Approximate)   BMI 46.40 kg/m   Skin warm and dry.Pelvic: external genitalia is normal in appearance no lesions, vagina: pink with good moisture,urethra has no lesions or masses noted, cervix:smooth and bulbous,pap with GC/CHL and high risk HPV genotyping performed, uterus: normal size, shape and contour, non tender, no masses felt, adnexa: no masses or tenderness noted. Bladder is non tender and no masses felt. AA is 1 Fall risk is low PHQ 9 score is 9 no SI, and is open to meds but declines counseling  GAD 7 score 11.    Upstream - 05/01/20 1201      Pregnancy Intention Screening   Does the patient want to become pregnant in the next year? No    Does the patient's partner want to become pregnant in the next year? No    Would the patient like to discuss contraceptive options today? No      Contraception Wrap Up   Current Method Female Sterilization    End Method Female Sterilization    Contraception Counseling Provided No            Examination chaperoned by 14/01/21 LPN  Assessment:      1. Routine medical exam  2. Encounter for gynecological examination with Papanicolaou smear of cervix Pap sent Pap in 3 years if normal Physical and labs with  PCP Mammogram at 39  3. Depression, unspecified depression type Will rx lexapro 10 mg 1 daily  Meds ordered this encounter  Medications  . escitalopram (LEXAPRO) 10 MG tablet    Sig: Take 1 tablet (10 mg total) by mouth daily.    Dispense:  30 tablet    Refill:  2    Order Specific Question:   Supervising Provider    Answer:   [name] [[id number]]  Call me after Christmas in follow up on meds     Plan:    Call me after Christmas for follow up on starting lexapro  Pap in 3 years of normal

## 2020-05-06 LAB — CYTOLOGY - PAP
Chlamydia: NEGATIVE
Comment: NEGATIVE
Comment: NEGATIVE
Comment: NORMAL
Diagnosis: NEGATIVE
High risk HPV: NEGATIVE
Neisseria Gonorrhea: NEGATIVE

## 2020-06-13 ENCOUNTER — Telehealth (INDEPENDENT_AMBULATORY_CARE_PROVIDER_SITE_OTHER): Payer: Medicaid Other | Admitting: Internal Medicine

## 2020-06-13 ENCOUNTER — Encounter: Payer: Self-pay | Admitting: Internal Medicine

## 2020-06-13 ENCOUNTER — Other Ambulatory Visit: Payer: Self-pay

## 2020-06-13 VITALS — Ht 64.0 in | Wt 285.0 lb

## 2020-06-13 DIAGNOSIS — E0590 Thyrotoxicosis, unspecified without thyrotoxic crisis or storm: Secondary | ICD-10-CM | POA: Diagnosis not present

## 2020-06-13 DIAGNOSIS — F320 Major depressive disorder, single episode, mild: Secondary | ICD-10-CM

## 2020-06-13 DIAGNOSIS — E0500 Thyrotoxicosis with diffuse goiter without thyrotoxic crisis or storm: Secondary | ICD-10-CM | POA: Diagnosis not present

## 2020-06-13 MED ORDER — ESCITALOPRAM OXALATE 10 MG PO TABS: 15.0000 mg | ORAL_TABLET | Freq: Every day | ORAL | 5 refills | Status: DC

## 2020-06-13 NOTE — Progress Notes (Signed)
Virtual Visit via Telephone Note   This visit type was conducted due to national recommendations for restrictions regarding the COVID-19 Pandemic (e.g. social distancing) in an effort to limit this patient's exposure and mitigate transmission in our community.  Due to her co-morbid illnesses, this patient is at least at moderate risk for complications without adequate follow up.  This format is felt to be most appropriate for this patient at this time.  The patient did not have access to video technology/had technical difficulties with video requiring transitioning to audio format only (telephone).  All issues noted in this document were discussed and addressed.  No physical exam could be performed with this format.  Evaluation Performed:  Follow-up visit  Date:  06/13/2020   ID:  Linda Skinner, DOB Mar 13, 1991, MRN [id number]  Patient Location: Home Provider Location: Office/Clinic  Location of Patient: Home Location of Provider: Telehealth Consent was obtain for visit to be over via telehealth. I verified that I am speaking with the correct person using two identifiers.  PCP:  Anabel Halon, MD   Chief Complaint:  Follow up of chronic medical conditions  History of Present Illness:    Linda Skinner is a 30 y.o. female with PMH of Grave's disease and major depressive disorder who has a televisit for follow up of her chronic medical conditions.  She had radioactive iodine treatment in 04/2020, and has not been on Methimazole since then. She reports less appetite, but denies weight changes, hair loss, skin or nail changes, tremors or palpitations. She follows up with Dr Fransico Him.  She had been started on Lexapro by her OB/GYN about 6 weeks ago and states that she has been feeling little better. She still has depressed mood on some days, but denies any suicidal ideation.  The patient does not have symptoms concerning for COVID-19 infection (fever, chills, cough, or new shortness of  breath).   Past Medical, Surgical, Social History, Allergies, and Medications have been Reviewed.  Past Medical History:  Diagnosis Date  . Encounter for tubal ligation 09/16/2017  . Gestational diabetes    metformin  . Graves disease   . Maternal varicella, non-immune 02/18/2017   Immunize p delivery  . Medical history non-contributory   . Supervision of high risk pregnancy, antepartum 02/17/2017    [facility] at  [facility] FOB Casim[facility] Needle [place] Dating By LMP c/w 1st trimester U/S 7wk Pap 05/23/15 neg [facility] (got records) GC/CT Initial:   -/-             36+wks:-/- Genetic Screen NT/IT: neg CF screen declined Anatomic [place] Normal female Flu vaccine 02/17/17  Tdap Recommended ~ 28wks Glucose Screen  2 hr   99/122/114  GDM GBS pos Feed Preference both Contraception BTL, consent 02/06/1  . SVD (spontaneous vaginal delivery) 09/15/2017   Past Surgical History:  Procedure Laterality Date  . MOUTH SURGERY    . TUBAL LIGATION N/A 09/15/2017   Procedure: POST PARTUM TUBAL LIGATION;  Surgeon: Kathrynn Running, MD;  Location: [facility] BIRTHING SUITES;  Service: Gynecology;  Laterality: N/A;  . WISDOM TOOTH EXTRACTION       Current Meds  Medication Sig  . [DISCONTINUED] escitalopram (LEXAPRO) 10 MG tablet Take 1 tablet (10 mg total) by mouth daily.     Allergies:   Patient has no known allergies.   ROS:   Please see the history of present illness.    Review of Systems  Constitutional: Negative for chills, fever and weight  loss.  Eyes: Negative for pain and redness.  Respiratory: Negative for cough and shortness of breath.   Cardiovascular: Negative for chest pain and palpitations.  Gastrointestinal: Negative for constipation, diarrhea, nausea and vomiting.  Genitourinary: Negative for dysuria and hematuria.  Musculoskeletal: Negative for neck pain.  Neurological: Negative for dizziness and focal weakness.  Psychiatric/Behavioral: Positive for depression. Negative for suicidal ideas.  The patient has insomnia.      Labs/Other Tests and Data Reviewed:    Recent Labs: 03/08/2020: ALT 18; BUN 6; Creatinine, Ser 0.67; Hemoglobin 12.0; Platelets 264; Potassium 4.6; Sodium 136; TSH <0.005; TSH <0.005   Recent Lipid Panel Lab Results  Component Value Date/Time   CHOL 186 03/08/2020 09:19 AM   TRIG 158 (H) 03/08/2020 09:19 AM   HDL 53 03/08/2020 09:19 AM   CHOLHDL 3.5 03/08/2020 09:19 AM   LDLCALC 105 (H) 03/08/2020 09:19 AM    Wt Readings from Last 3 Encounters:  06/13/20 285 lb (129.3 kg)  05/01/20 287 lb 8 oz (130.4 kg)  04/12/20 285 lb 3.2 oz (129.4 kg)      ASSESSMENT & PLAN:    Hyperthyroidism Lab Results  Component Value Date   TSH <0.005 (L) 03/08/2020   TSH <0.005 (L) 03/08/2020   Was on Methimazole Has radioactive iodine treatment in 04/2020. Denies any weight changes, but reports less appetite Check TSH and free T4 Follow up with Endocrinologist  Depression, major, single episode, mild (HCC) Was started on Lexapro by her OB/GYN States she feels little better, but still has anhedonia at times Increased Lexapro to 15 mg QD    Time:   Today, I have spent 23 minutes reviewing the chart, including problem list, medications, and with the patient with telehealth technology discussing the above problems.   Medication Adjustments/Labs and Tests Ordered: Current medicines are reviewed at length with the patient today.  Concerns regarding medicines are outlined above.   Tests Ordered: No orders of the defined types were placed in this encounter.   Medication Changes: Meds ordered this encounter  Medications  . escitalopram (LEXAPRO) 10 MG tablet    Sig: Take 1.5 tablets (15 mg total) by mouth daily.    Dispense:  45 tablet    Refill:  5     Note: This dictation was prepared with Dragon dictation along with smaller phrase technology. Similar sounding words can be transcribed inadequately or may not be corrected upon review. Any  transcriptional errors that result from this process are unintentional.      Disposition:  Follow up  Signed, Anabel Halon, MD  06/13/2020 10:12 AM     [facility]

## 2020-06-13 NOTE — Assessment & Plan Note (Signed)
Was started on Lexapro by her OB/GYN States she feels little better, but still has anhedonia at times Increased Lexapro to 15 mg QD

## 2020-06-13 NOTE — Assessment & Plan Note (Signed)
Lab Results  Component Value Date   TSH <0.005 (L) 03/08/2020   TSH <0.005 (L) 03/08/2020   Was on Methimazole Has radioactive iodine treatment in 04/2020. Denies any weight changes, but reports less appetite Check TSH and free T4 Follow up with Endocrinologist

## 2020-06-13 NOTE — Patient Instructions (Signed)
Please start taking Lexapro 15 mg once daily.  Please continue to follow up with Dr Fransico Him for thyroid treatment.  Please follow heart healthy diet and perform moderate exercise/walking at least 150 mins/week.

## 2020-06-14 ENCOUNTER — Ambulatory Visit: Payer: Medicaid Other | Admitting: "Endocrinology

## 2020-09-20 ENCOUNTER — Other Ambulatory Visit: Payer: Self-pay | Admitting: Adult Health

## 2020-09-20 DIAGNOSIS — F320 Major depressive disorder, single episode, mild: Secondary | ICD-10-CM

## 2020-12-12 ENCOUNTER — Ambulatory Visit: Payer: Medicaid Other | Admitting: Internal Medicine

## 2021-01-13 ENCOUNTER — Encounter: Payer: Self-pay | Admitting: Internal Medicine

## 2021-01-13 ENCOUNTER — Ambulatory Visit: Payer: Medicaid Other | Admitting: Internal Medicine

## 2021-01-13 ENCOUNTER — Other Ambulatory Visit: Payer: Self-pay

## 2021-01-13 VITALS — BP 140/79 | HR 82 | Temp 98.8°F | Resp 18 | Ht 64.0 in | Wt 305.0 lb

## 2021-01-13 DIAGNOSIS — F320 Major depressive disorder, single episode, mild: Secondary | ICD-10-CM

## 2021-01-13 DIAGNOSIS — E0590 Thyrotoxicosis, unspecified without thyrotoxic crisis or storm: Secondary | ICD-10-CM

## 2021-01-13 DIAGNOSIS — B360 Pityriasis versicolor: Secondary | ICD-10-CM

## 2021-01-13 MED ORDER — KETOCONAZOLE 2 % EX CREA: 1.0000 "application " | TOPICAL_CREAM | Freq: Two times a day (BID) | CUTANEOUS | 0 refills | Status: DC

## 2021-01-13 MED ORDER — ESCITALOPRAM OXALATE 20 MG PO TABS: 20.0000 mg | ORAL_TABLET | Freq: Every day | ORAL | 1 refills | Status: DC

## 2021-01-13 NOTE — Patient Instructions (Signed)
Please start taking Lexapro 20 mg once daily.  You are being referred to Behavioral health therapist.  Please schedule an appointment with Dr Fransico Him to discuss treatment of hyperthyroidism.  Please apply Ketoconazole cream for skin rash.

## 2021-01-13 NOTE — Assessment & Plan Note (Signed)
PHQ9 SCORE ONLY 01/13/2021 06/13/2020 05/01/2020  PHQ-9 Total Score 20 1 9    Some symptoms of mood changes would be because of hyperthyroidism On Lexapro 15 mg, increased dose to 20 mg QD Referred to [facility] therapy Needs to discuss about emotional support animal with therapist

## 2021-01-13 NOTE — Assessment & Plan Note (Signed)
Lab Results  Component Value Date   TSH <0.005 (L) 03/08/2020   TSH <0.005 (L) 03/08/2020   Was on Methimazole Had radioactive iodine treatment in 04/2020. Denies any weight changes, but reports less appetite Check TSH, free T3 and T4 Needs follow up with Endocrinologist

## 2021-01-13 NOTE — Assessment & Plan Note (Signed)
Rash appears to be related to Tinea Started Ketoconazole cream

## 2021-01-13 NOTE — Progress Notes (Signed)
Established Patient Office Visit  Subjective:  Patient ID: Linda Skinner, female    DOB: 1991-04-26  Age: 30 y.o. MRN: [id number]  CC:  Chief Complaint  Patient presents with   Follow-up    6 month follow up has been dealing with depression for about a week started on 01-05-21 also for about a month has patches on skin popping up in large circles    HPI Linda Skinner is a 30 y.o. female with PMH of Grave's disease and major depressive disorder who presents for follow up of her chronic medical conditions.  She had radioactive iodine therapy for Graves' disease in 04/2020. She has not had any thyroid function testing or Endocrinology visit since then. She denies weight changes, hair loss, skin or nail changes, tremors or palpitations.  She still c/o anhedonia and lack of interest in doing activities. She has been taking Lexapro 15 mg, but misses doses at times. She denies any SI or HI. She asks for emotional support animal.  She c/o rash over her upper chest wall area and underneath the abdominal folds, which have been present for a long time. Denies any itching or discharge from the area.  Past Medical History:  Diagnosis Date   Encounter for tubal ligation 09/16/2017   Gestational diabetes    metformin   Graves disease    Maternal varicella, non-immune 02/18/2017   Immunize p delivery   Medical history non-contributory    Supervision of high risk pregnancy, antepartum 02/17/2017    [facility] at  [facility] FOB Casimiro Needle [place] Dating By LMP c/w 1st trimester U/S 7wk Pap 05/23/15 neg [facility] (got records) GC/CT Initial:   -/-             36+wks:-/- Genetic Screen NT/IT: neg CF screen declined Anatomic [place] Normal female Flu vaccine 02/17/17  Tdap Recommended ~ 28wks Glucose Screen  2 hr   99/122/114  GDM GBS pos Feed Preference both Contraception BTL, consent 02/06/1   SVD (spontaneous vaginal delivery) 09/15/2017    Past Surgical History:  Procedure Laterality Date   MOUTH  SURGERY     TUBAL LIGATION N/A 09/15/2017   Procedure: POST PARTUM TUBAL LIGATION;  Surgeon: Kathrynn Running, MD;  [facility];  Service: Gynecology;  Laterality: N/A;   WISDOM TOOTH EXTRACTION      Family History  Problem Relation Age of Onset   Lupus Mother    Cancer Paternal Grandfather    Atrial fibrillation Maternal Grandmother     Social History   Socioeconomic History   Marital status: Single    Spouse name: Not on file   Number of children: Not on file   Years of education: Not on file   Highest education level: Not on file  Occupational History   Not on file  Tobacco Use   Smoking status: Former    Packs/day: 0.01    Years: 4.00    Pack years: 0.04    Types: Cigars, Cigarettes   Smokeless tobacco: Never  Vaping Use   Vaping Use: Never used  Substance and Sexual Activity   Alcohol use: No   Drug use: No   Sexual activity: Not Currently    Birth control/protection: Surgical    Comment: tubal  Other Topics Concern   Not on file  Social History Narrative   Not on file   Social Determinants of Health   Financial Resource Strain: Low Risk    Difficulty of Paying Living Expenses: Not  hard at all  Food Insecurity: No Food Insecurity   Worried About Charity fundraiser in the Last Year: Never true   Ran Out of Food in the Last Year: Never true  Transportation Needs: No Transportation Needs   Lack of Transportation (Medical): No   Lack of Transportation (Non-Medical): No  Physical Activity: Insufficiently Active   Days of Exercise per Week: 3 days   Minutes of Exercise per Session: 20 min  Stress: Stress Concern Present   Feeling of Stress : To some extent  Social Connections: Socially Isolated   Frequency of Communication with Friends and Family: Twice a week   Frequency of Social Gatherings with Friends and Family: Twice a week   Attends Religious Services: Never   Marine scientist or Organizations: No   Attends Programme researcher, broadcasting/film/video: Never   Marital Status: Never married  Human resources officer Violence: Not At Risk   Fear of Current or Ex-Partner: No   Emotionally Abused: No   Physically Abused: No   Sexually Abused: No    Outpatient Medications Prior to Visit  Medication Sig Dispense Refill   escitalopram (LEXAPRO) 10 MG tablet Take 1.5 tablets (15 mg total) by mouth daily. 45 tablet 5   No facility-administered medications prior to visit.    No Known Allergies  ROS Review of Systems  Constitutional:  Negative for chills and fever.  HENT:  Negative for congestion, sinus pressure, sinus pain and sore throat.   Eyes:  Negative for pain and discharge.  Respiratory:  Negative for cough and shortness of breath.   Cardiovascular:  Negative for chest pain and palpitations.  Gastrointestinal:  Negative for abdominal pain, constipation, diarrhea, nausea and vomiting.  Endocrine: Negative for polydipsia and polyuria.  Genitourinary:  Negative for dysuria and hematuria.  Musculoskeletal:  Negative for neck pain and neck stiffness.  Skin:  Positive for rash.  Neurological:  Negative for dizziness and weakness.  Psychiatric/Behavioral:  Positive for decreased concentration and dysphoric mood. Negative for agitation, sleep disturbance and suicidal ideas.      Objective:    Physical Exam Vitals reviewed.  Constitutional:      General: She is not in acute distress.    Appearance: She is obese. She is not diaphoretic.  HENT:     Head: Normocephalic and atraumatic.     Nose: Nose normal.     Mouth/Throat:     Mouth: Mucous membranes are moist.  Eyes:     General: No scleral icterus.    Extraocular Movements: Extraocular movements intact.     Pupils: Pupils are equal, round, and reactive to light.  Cardiovascular:     Rate and Rhythm: Normal rate and regular rhythm.     Pulses: Normal pulses.     Heart sounds: No murmur heard. Pulmonary:     Breath sounds: Normal breath sounds. No wheezing  or rales.  Abdominal:     Palpations: Abdomen is soft.     Tenderness: There is no abdominal tenderness.  Musculoskeletal:     Cervical back: Normal range of motion and neck supple. No rigidity or tenderness.     Right lower leg: No edema.     Left lower leg: No edema.  Skin:    General: Skin is warm.     Coloration: Skin is not pale.     Findings: Rash (Round erythematous patches noted over upper chest wall area and underneath abdominal fold) present.  Neurological:     General: No  focal deficit present.     Mental Status: She is alert and oriented to person, place, and time.     Cranial Nerves: No cranial nerve deficit.     Sensory: No sensory deficit.  Psychiatric:        Mood and Affect: Mood normal.        Behavior: Behavior normal.    BP 140/79 (BP Location: Right Arm, Patient Position: Sitting, Cuff Size: Normal)   Pulse 82   Temp 98.8 F (37.1 C) (Oral)   Resp 18   Ht $R'5\' 4"'mv$  (1.626 m)   Wt (!) 305 lb 0.6 oz (138.4 kg)   SpO2 98%   BMI 52.36 kg/m  Wt Readings from Last 3 Encounters:  01/13/21 (!) 305 lb 0.6 oz (138.4 kg)  06/13/20 285 lb (129.3 kg)  05/01/20 287 lb 8 oz (130.4 kg)     Health Maintenance Due  Topic Date Due   Hepatitis C Screening  Never done   COVID-19 Vaccine (3 - Booster for Pfizer series) 07/30/2020   INFLUENZA VACCINE  12/30/2020    There are no preventive care reminders to display for this patient.  Lab Results  Component Value Date   TSH <0.005 (L) 03/08/2020   TSH <0.005 (L) 03/08/2020   Lab Results  Component Value Date   WBC 7.2 03/08/2020   HGB 12.0 03/08/2020   HCT 36.4 03/08/2020   MCV 80 03/08/2020   PLT 264 03/08/2020   Lab Results  Component Value Date   NA 136 03/08/2020   K 4.6 03/08/2020   CO2 22 03/08/2020   GLUCOSE 99 03/08/2020   BUN 6 03/08/2020   CREATININE 0.67 03/08/2020   BILITOT <0.2 03/08/2020   ALKPHOS 65 03/08/2020   AST 13 03/08/2020   ALT 18 03/08/2020   PROT 6.8 03/08/2020   ALBUMIN 3.9  03/08/2020   CALCIUM 9.1 03/08/2020   ANIONGAP 9 05/05/2018   Lab Results  Component Value Date   CHOL 186 03/08/2020   Lab Results  Component Value Date   HDL 53 03/08/2020   Lab Results  Component Value Date   LDLCALC 105 (H) 03/08/2020   Lab Results  Component Value Date   TRIG 158 (H) 03/08/2020   Lab Results  Component Value Date   CHOLHDL 3.5 03/08/2020   Lab Results  Component Value Date   HGBA1C 5.8 (H) 03/08/2020      Assessment & Plan:   Problem List Items Addressed This Visit       Endocrine   Hyperthyroidism - Primary    Lab Results  Component Value Date   TSH <0.005 (L) 03/08/2020   TSH <0.005 (L) 03/08/2020  Was on Methimazole Had radioactive iodine treatment in 04/2020. Denies any weight changes, but reports less appetite Check TSH, free T3 and T4 Needs follow up with Endocrinologist      Relevant Orders   TSH+T4F+T3Free   CMP14+EGFR     Musculoskeletal and Integument   Tinea versicolor    Rash appears to be related to Tinea Started Ketoconazole cream      Relevant Medications   ketoconazole (NIZORAL) 2 % cream     Other   Depression, major, single episode, mild (HCC)    PHQ9 SCORE ONLY 01/13/2021 06/13/2020 05/01/2020  PHQ-9 Total Score $RemoveBef'20 1 9  'AIKHaCsbvZ$ Some symptoms of mood changes would be because of hyperthyroidism On Lexapro 15 mg, increased dose to 20 mg QD Referred to [facility] therapy Needs to discuss about emotional support animal  with therapist      Relevant Medications   escitalopram (LEXAPRO) 20 MG tablet   Other Relevant Orders   B12   Vitamin D (25 hydroxy)   Ambulatory referral to Psychiatry    Meds ordered this encounter  Medications   escitalopram (LEXAPRO) 20 MG tablet    Sig: Take 1 tablet (20 mg total) by mouth daily.    Dispense:  90 tablet    Refill:  1   ketoconazole (NIZORAL) 2 % cream    Sig: Apply 1 application topically 2 (two) times daily.    Dispense:  30 g    Refill:  0    Follow-up: Return in about 3  months (around 04/15/2021) for Annual physical.    Lindell Spar, MD

## 2021-01-14 ENCOUNTER — Other Ambulatory Visit: Payer: Self-pay | Admitting: Internal Medicine

## 2021-01-14 DIAGNOSIS — E0500 Thyrotoxicosis with diffuse goiter without thyrotoxic crisis or storm: Secondary | ICD-10-CM

## 2021-01-14 DIAGNOSIS — E559 Vitamin D deficiency, unspecified: Secondary | ICD-10-CM

## 2021-01-14 MED ORDER — VITAMIN D (ERGOCALCIFEROL) 1.25 MG (50000 UNIT) PO CAPS: 50000.0000 [IU] | ORAL_CAPSULE | ORAL | 5 refills | Status: DC

## 2021-01-14 MED ORDER — LEVOTHYROXINE SODIUM 25 MCG PO TABS: 25.0000 ug | ORAL_TABLET | Freq: Every day | ORAL | 0 refills | Status: DC

## 2021-01-15 ENCOUNTER — Other Ambulatory Visit: Payer: Self-pay | Admitting: Internal Medicine

## 2021-01-15 DIAGNOSIS — E0500 Thyrotoxicosis with diffuse goiter without thyrotoxic crisis or storm: Secondary | ICD-10-CM

## 2021-01-15 LAB — CMP14+EGFR
ALT: 11 IU/L (ref 0–32)
AST: 19 IU/L (ref 0–40)
Albumin/Globulin Ratio: 1.4 (ref 1.2–2.2)
Albumin: 4.3 g/dL (ref 3.8–4.8)
Alkaline Phosphatase: 61 IU/L (ref 44–121)
BUN/Creatinine Ratio: 10 (ref 9–23)
BUN: 7 mg/dL (ref 6–20)
Bilirubin Total: <0.2 mg/dL (ref 0.0–1.2)
CO2: 21 mmol/L (ref 20–29)
Calcium: 9.6 mg/dL (ref 8.7–10.2)
Chloride: 104 mmol/L (ref 96–106)
Creatinine, Ser: 0.72 mg/dL (ref 0.57–1.00)
Globulin, Total: 3 g/dL (ref 1.5–4.5)
Glucose: 96 mg/dL (ref 65–99)
Potassium: 4.4 mmol/L (ref 3.5–5.2)
Sodium: 140 mmol/L (ref 134–144)
Total Protein: 7.3 g/dL (ref 6.0–8.5)
eGFR: 115 mL/min/{1.73_m2} (ref >59)

## 2021-01-15 LAB — TSH+T4F+T3FREE
Free T4: 0.74 ng/dL — ABNORMAL LOW (ref 0.82–1.77)
T3, Free: 2.3 pg/mL (ref 2.0–4.4)
TSH: 11.9 u[IU]/mL — ABNORMAL HIGH (ref 0.450–4.500)

## 2021-01-15 LAB — VITAMIN B12: Vitamin B-12: 497 pg/mL (ref 232–1245)

## 2021-01-15 LAB — VITAMIN D 25 HYDROXY (VIT D DEFICIENCY, FRACTURES): Vit D, 25-Hydroxy: 18.8 ng/mL — ABNORMAL LOW (ref 30.0–100.0)

## 2021-01-21 ENCOUNTER — Other Ambulatory Visit: Payer: Self-pay

## 2021-01-21 ENCOUNTER — Telehealth (INDEPENDENT_AMBULATORY_CARE_PROVIDER_SITE_OTHER): Payer: Medicaid Other | Admitting: Licensed Clinical Social Worker

## 2021-01-21 DIAGNOSIS — F320 Major depressive disorder, single episode, mild: Secondary | ICD-10-CM

## 2021-01-22 ENCOUNTER — Telehealth (INDEPENDENT_AMBULATORY_CARE_PROVIDER_SITE_OTHER): Payer: Medicaid Other | Admitting: Licensed Clinical Social Worker

## 2021-01-22 ENCOUNTER — Telehealth: Payer: Self-pay | Admitting: Licensed Clinical Social Worker

## 2021-01-22 DIAGNOSIS — F320 Major depressive disorder, single episode, mild: Secondary | ICD-10-CM

## 2021-01-22 NOTE — BH Specialist Note (Signed)
Virtual Behavioral Health Treatment Plan Team Note  MRN: [id number] NAME: Linda Skinner  DATE: 01/22/21  Start time:   350pEnd time:  355p Total time:  5 min  Total number of Virtual BH Treatment Team Plan encounters: 1/4  Treatment Team Attendees: Nolon Rod, LCSW & Dr. Vanetta Shawl, Psychiatrist  Diagnoses: No diagnosis found.  Goals, Interventions and Follow-up Plan Goals: Increase healthy adjustment to current life circumstances Increase adequate support systems for patient/family Interventions: Solution-Focused Strategies Mindfulness or Relaxation Training Medication Management Recommendations: no change Follow-up Plan: Bi weekly VBH sessions  History of the present illness Presenting Problem/Current Symptoms: symptoms of DX  Psychiatric History  Depression: No Anxiety: No Mania: No Psychosis: No PTSD symptoms: No  Past Psychiatric History/Hospitalization(s): Hospitalization for psychiatric illness: No Prior Suicide Attempts: No Prior Self-injurious behavior: No  Psychosocial stressors  bereavement Self-harm Behaviors Risk Assessment n/a  Screenings PHQ-9 Assessments:  Depression screen [facility] 2/9 01/22/2021 01/13/2021 06/13/2020  Decreased Interest 2 0 0  Down, Depressed, Hopeless 2 3 1   PHQ - 2 Score 4 3 1   Altered sleeping 2 3 -  Tired, decreased energy 2 3 -  Change in appetite 2 3 -  Feeling bad or failure about yourself  2 3 -  Trouble concentrating 2 2 -  Moving slowly or fidgety/restless 2 3 -  Suicidal thoughts 2 0 -  PHQ-9 Score 18 20 -  Difficult doing work/chores Very difficult Somewhat difficult -   GAD-7 Assessments:  GAD 7 : Generalized Anxiety Score 01/22/2021 05/01/2020  Nervous, Anxious, on Edge 0 1  Control/stop worrying 0 2  Worry too much - different things 2 2  Trouble relaxing 2 1  Restless 2 1  Easily annoyed or irritable 0 3  Afraid - awful might happen 0 1  Total GAD 7 Score 6 11  Anxiety Difficulty Not difficult at all -     Past Medical History Past Medical History:  Diagnosis Date   Encounter for tubal ligation 09/16/2017   Gestational diabetes    metformin   Graves disease    Maternal varicella, non-immune 02/18/2017   Immunize p delivery   Medical history non-contributory    Supervision of high risk pregnancy, antepartum 02/17/2017    Clinic [facility] Initiated Care at  [facility] FOB 02/19/2017 [place] Dating By LMP c/w 1st trimester U/S 7wk Pap 05/23/15 neg [facility] (got records) GC/CT Initial:   -/-             36+wks:-/- Genetic Screen NT/IT: neg CF screen declined Anatomic Casimiro Needle Normal female Flu vaccine 02/17/17  Tdap Recommended ~ 28wks Glucose Screen  2 hr   99/122/114  GDM GBS pos Feed Preference both Contraception BTL, consent 02/06/1   SVD (spontaneous vaginal delivery) 09/15/2017    Vital signs: There were no vitals filed for this visit.  Allergies:  Allergies as of 01/22/2021   (No Known Allergies)    Medication History Current medications:  Outpatient Encounter Medications as of 01/22/2021  Medication Sig   escitalopram (LEXAPRO) 20 MG tablet Take 1 tablet (20 mg total) by mouth daily.   ketoconazole (NIZORAL) 2 % cream Apply 1 application topically 2 (two) times daily.   levothyroxine (SYNTHROID) 25 MCG tablet Take 1 tablet (25 mcg total) by mouth daily.   Vitamin D, Ergocalciferol, (DRISDOL) 1.25 MG (50000 UNIT) CAPS capsule Take 1 capsule (50,000 Units total) by mouth every 7 (seven) days.   No facility-administered encounter medications on file as of 01/22/2021.     Scribe  for Treatment Team: Marinda Elk, LCSW

## 2021-01-22 NOTE — BH Specialist Note (Signed)
[facility] Initial Clinical Assessment  MRN: [id number] NAME: Linda Skinner Date: 01/21/21  Start time:   935aEnd time:  1005a Total time:  30 min   Type of Contact:  video Patient location: home Provider location: home office Patient consent obtained:  yes Reason for Visit today:  initiate VBH service  Treatment History Patient recently received Inpatient Treatment:  n/a  Facility/Program:    Date of discharge:   Patient currently being seen by therapist/psychiatrist:   Patient currently receiving the following services:    Past Psychiatric History/Hospitalization(s): Anxiety: No Bipolar Disorder: No Depression: Yes Mania: No Psychosis: No Schizophrenia: No Personality Disorder: No Hospitalization for psychiatric illness: No History of Electroconvulsive Shock Therapy: No Prior Suicide Attempts: No  Clinical Assessment:  PHQ-9 Assessments: Depression screen [facility] 2/9 01/22/2021 01/13/2021 06/13/2020  Decreased Interest 2 0 0  Down, Depressed, Hopeless 2 3 1   PHQ - 2 Score 4 3 1   Altered sleeping 2 3 -  Tired, decreased energy 2 3 -  Change in appetite 2 3 -  Feeling bad or failure about yourself  2 3 -  Trouble concentrating 2 2 -  Moving slowly or fidgety/restless 2 3 -  Suicidal thoughts 2 0 -  PHQ-9 Score 18 20 -  Difficult doing work/chores Very difficult Somewhat difficult -    GAD-7 Assessments: GAD 7 : Generalized Anxiety Score 01/22/2021 05/01/2020  Nervous, Anxious, on Edge 0 1  Control/stop worrying 0 2  Worry too much - different things 2 2  Trouble relaxing 2 1  Restless 2 1  Easily annoyed or irritable 0 3  Afraid - awful might happen 0 1  Total GAD 7 Score 6 11  Anxiety Difficulty Not difficult at all -     Social Functioning Social maturity:  WNL Social judgement:  WNL  Stress Current stressors:  poor relationships, grief of mother's death Familial stressors:  caring for her children when in "a funk" Sleep:  poor Appetite:   fair Coping ability:  overwhelmed Patient taking medications as prescribed:  yes, Escitalopram  Current medications:  Outpatient Encounter Medications as of 01/22/2021  Medication Sig   escitalopram (LEXAPRO) 20 MG tablet Take 1 tablet (20 mg total) by mouth daily.   ketoconazole (NIZORAL) 2 % cream Apply 1 application topically 2 (two) times daily.   levothyroxine (SYNTHROID) 25 MCG tablet Take 1 tablet (25 mcg total) by mouth daily.   Vitamin D, Ergocalciferol, (DRISDOL) 1.25 MG (50000 UNIT) CAPS capsule Take 1 capsule (50,000 Units total) by mouth every 7 (seven) days.   No facility-administered encounter medications on file as of 01/22/2021.    Self-harm Behaviors Risk Assessment Self-harm risk factors:  n/a Patient endorses recent thoughts of harming self:    01/24/2021 Suicide Severity Rating Scale: No flowsheet data found.  Danger to Others Risk Assessment Danger to others risk factors:  n/a Patient endorses recent thoughts of harming others:    Dynamic Appraisal of Situational Aggression (DASA): No flowsheet data found.  Substance Use Assessment Patient recently consumed alcohol:  n/a  Alcohol Use Disorder Identification Test (AUDIT):  Alcohol Use Disorder Test (AUDIT) 05/01/2020  1. How often do you have a drink containing alcohol? 1  2. How many drinks containing alcohol do you have on a typical day when you are drinking? 0  3. How often do you have six or more drinks on one occasion? 0  AUDIT-C Score 1   Patient recently used drugs:  n/a  Opioid Risk Assessment:  Patient is concerned about dependence or abuse of substances:    ASAM Multidimensional Assessment Summary:  Dimension 1:    Dimension 1 Rating:    Dimension 2:    Dimension 2 Rating:    Dimension 3:    Dimension 3 Rating:    Dimension 4:    Dimension 4 Rating:    Dimension 5:    Dimension 5 Rating:    Dimension 6:    Dimension 6 Rating:   ASAM's Severity Rating Score:   ASAM Recommended Level of  Treatment:     Goals, Interventions and Follow-up Plan Goals: Increase healthy adjustment to current life circumstances and Increase adequate support systems for patient/family Interventions: Solution-Focused Strategies and Mindfulness or Relaxation Training Follow-up Plan:  Bi weekly VBH sessions  Summary of Clinical Assessment Summary: Linda Skinner is a 30 yr old woman who was referred by Dr. Allena Katz for depression. Linda Skinner reports that she moved to [facility] from [place], [place] about 9 yrs ago after her mother died. She continues to have unresolved grief and was tearful throughout the session. She has 3 children that she cares for and describes her depressive episodes as "a funk." She reports that she has "a funk" every few days and her sister will care for her children during that time.  She will call in at work when she does not feel well in order to isolate herself and has poor motivation. Provided homework assignment to "try something new such as an exercise, activity with her children or taking a different route to work." Will return in 2 weeks   Linda Elk, LCSW

## 2021-01-22 NOTE — Progress Notes (Signed)
[facility]) Psychiatric Consultant Case Review   Linda Skinner is a 30 y.o. year old female with a history of depression, hyperthyroidism. She was observed to be tearful during the intake with [facility] specialist. Psychosocial stressors including loss of her mother in around 2013. She has three children (age 30 to 33), works at [employer]. Lexapro was uptitrated by her PCP.   Assessment/Provisional Diagnosis # MDD Will continue current dose of lexapro given it is recently uptitrated.   Recommendation  - continue lexapro 20 mg daily - BH specialist to offer supportive therapy for grief, behavioral activation  Thank you for your consult. We will continue to follow the patient. Please contact [facility]  for any questions or concerns.   The above treatment considerations and suggestions are based on consultation with the [facility] specialist and/or PCP and a review of information available in the shared registry and the patient's Electronic Health Record (EHR). I have not personally examined the patient. All recommendations should be implemented with consideration of the patient's relevant prior history and current clinical status. Please feel free to call me with any questions about the care of this patient.

## 2021-01-24 ENCOUNTER — Other Ambulatory Visit: Payer: Self-pay

## 2021-01-24 ENCOUNTER — Ambulatory Visit (INDEPENDENT_AMBULATORY_CARE_PROVIDER_SITE_OTHER): Payer: Medicaid Other | Admitting: "Endocrinology

## 2021-01-24 ENCOUNTER — Encounter: Payer: Self-pay | Admitting: "Endocrinology

## 2021-01-24 VITALS — BP 121/82 | HR 108 | Ht 64.0 in | Wt 303.0 lb

## 2021-01-24 DIAGNOSIS — E0500 Thyrotoxicosis with diffuse goiter without thyrotoxic crisis or storm: Secondary | ICD-10-CM | POA: Diagnosis not present

## 2021-01-24 DIAGNOSIS — E890 Postprocedural hypothyroidism: Secondary | ICD-10-CM | POA: Diagnosis not present

## 2021-01-24 MED ORDER — LEVOTHYROXINE SODIUM 100 MCG PO TABS: 100.0000 ug | ORAL_TABLET | Freq: Every day | ORAL | 2 refills | Status: DC

## 2021-01-24 NOTE — Progress Notes (Signed)
01/24/2021      Endocrinology follow-up note   Subjective:    Patient ID: Linda Skinner, female    DOB: 1990-09-08, PCP Anabel Halon, MD.   Past Medical History:  Diagnosis Date   Encounter for tubal ligation 09/16/2017   Gestational diabetes    metformin   Graves disease    Maternal varicella, non-immune 02/18/2017   Immunize p delivery   Medical history non-contributory    Supervision of high risk pregnancy, antepartum 02/17/2017    [facility] Initiated Care at  [facility] FOB [facility] [place] Dating By LMP c/w 1st trimester U/S 7wk Pap 05/23/15 neg [facility] (got records) GC/CT Initial:   -/-             36+wks:-/- Genetic Screen NT/IT: neg CF screen declined Anatomic [place] Normal female Flu vaccine 02/17/17  Tdap Recommended ~ 28wks Glucose Screen  2 hr   99/122/114  GDM GBS pos Feed Preference both Contraception BTL, consent 02/06/1   SVD (spontaneous vaginal delivery) 09/15/2017    Past Surgical History:  Procedure Laterality Date   MOUTH SURGERY     TUBAL LIGATION N/A 09/15/2017   Procedure: POST PARTUM TUBAL LIGATION;  Surgeon: Kathrynn Running, MD;  Location: [facility];  Service: Gynecology;  Laterality: N/A;   WISDOM TOOTH EXTRACTION      Social History   Socioeconomic History   Marital status: Single    Spouse name: Not on file   Number of children: Not on file   Years of education: Not on file   Highest education level: Not on file  Occupational History   Not on file  Tobacco Use   Smoking status: Former    Packs/day: 0.01    Years: 4.00    Pack years: 0.04    Types: Cigars, Cigarettes   Smokeless tobacco: Never  Vaping Use   Vaping Use: Never used  Substance and Sexual Activity   Alcohol use: No   Drug use: No   Sexual activity: Not Currently    Birth control/protection: Surgical    Comment: tubal  Other Topics Concern   Not on file  Social History Narrative   Not on file   Social Determinants of Health   Financial Resource Strain:  Low Risk    Difficulty of Paying Living Expenses: Not hard at all  Food Insecurity: No Food Insecurity   Worried About Programme researcher, broadcasting/film/video in the Last Year: Never true   Ran Out of Food in the Last Year: Never true  Transportation Needs: No Transportation Needs   Lack of Transportation (Medical): No   Lack of Transportation (Non-Medical): No  Physical Activity: Insufficiently Active   Days of Exercise per Week: 3 days   Minutes of Exercise per Session: 20 min  Stress: Stress Concern Present   Feeling of Stress : To some extent  Social Connections: Socially Isolated   Frequency of Communication with Friends and Family: Twice a week   Frequency of Social Gatherings with Friends and Family: Twice a week   Attends Religious Services: Never   Database administrator or Organizations: No   Attends Banker Meetings: Never   Marital Status: Never married    Family History  Problem Relation Age of Onset   Lupus Mother    Cancer Paternal Grandfather    Atrial fibrillation Maternal Grandmother     Outpatient Encounter Medications as of 01/24/2021  Medication Sig   escitalopram (LEXAPRO) 20 MG  tablet Take 1 tablet (20 mg total) by mouth daily.   ketoconazole (NIZORAL) 2 % cream Apply 1 application topically 2 (two) times daily.   levothyroxine (SYNTHROID) 100 MCG tablet Take 1 tablet (100 mcg total) by mouth daily before breakfast.   Vitamin D, Ergocalciferol, (DRISDOL) 1.25 MG (50000 UNIT) CAPS capsule Take 1 capsule (50,000 Units total) by mouth every 7 (seven) days.   [DISCONTINUED] levothyroxine (SYNTHROID) 25 MCG tablet Take 1 tablet (25 mcg total) by mouth daily.   No facility-administered encounter medications on file as of 01/24/2021.    ALLERGIES: No Known Allergies  VACCINATION STATUS: Immunization History  Administered Date(s) Administered   Influenza,inj,Quad PF,6+ Mos 02/17/2017, 03/01/2020   PFIZER(Purple Top)SARS-COV-2 Vaccination 02/07/2020, 03/01/2020    PPD Test 03/11/2020, 03/25/2020   Tdap 09/16/2017     HPI  Linda KannerStacey Rodrigues is 30 y.o. female who presents today with her new labs and uptake and scan of her thyroid.    she was recently seen in consultation for hyperthyroidism requested by Anabel HalonPatel, Rutwik K, MD. After appropriate work-up confirmed primary hyperthyroidism from Graves' disease, she was sent for radioactive iodine ablation of her thyroid.  Treatment was administered in November 2021.  She did not return for follow-up.  Recently, due to treatment effect showing hypothyroidism, she was started on low-dose levothyroxine by her PMD.  She returns with gain of 15 pounds that she lost during hypothyroidism.    She denies palpitations, tremor, nor heat intolerance at this time.  she denies family history of thyroid dysfunction. -She denies family hx of thyroid cancer. she denies personal history of goiter.she  is willing to proceed with appropriate work up and therapy for thyrotoxicosis.                           Review of systems  Constitutional: + Weight gain, + fatigue.      Objective:    BP 121/82   Pulse (!) 108   Ht 5\' 4"  (1.626 m)   Wt (!) 303 lb (137.4 kg)   BMI 52.01 kg/m   Wt Readings from Last 3 Encounters:  01/24/21 (!) 303 lb (137.4 kg)  01/13/21 (!) 305 lb 0.6 oz (138.4 kg)  06/13/20 285 lb (129.3 kg)                                                Physical exam  Constitutional: Body mass index is 52.01 kg/m., not in acute distress, + normal state of mind   CMP     Component Value Date/Time   NA 140 01/13/2021 1414   K 4.4 01/13/2021 1414   CL 104 01/13/2021 1414   CO2 21 01/13/2021 1414   GLUCOSE 96 01/13/2021 1414   GLUCOSE 108 (H) 05/05/2018 0750   BUN 7 01/13/2021 1414   CREATININE 0.72 01/13/2021 1414   CALCIUM 9.6 01/13/2021 1414   PROT 7.3 01/13/2021 1414   ALBUMIN 4.3 01/13/2021 1414   AST 19 01/13/2021 1414   ALT 11 01/13/2021 1414   ALKPHOS 61 01/13/2021 1414   BILITOT <0.2  01/13/2021 1414   GFRNONAA 118 03/08/2020 0919   GFRAA 136 03/08/2020 0919     CBC    Component Value Date/Time   WBC 7.2 03/08/2020 0919   WBC 10.3 05/05/2018 0750   RBC 4.54 03/08/2020 0919  RBC 4.48 05/05/2018 0750   HGB 12.0 03/08/2020 0919   HCT 36.4 03/08/2020 0919   PLT 264 03/08/2020 0919   MCV 80 03/08/2020 0919   MCH 26.4 (L) 03/08/2020 0919   MCH 25.4 (L) 05/05/2018 0750   MCHC 33.0 03/08/2020 0919   MCHC 31.8 05/05/2018 0750   RDW 13.2 03/08/2020 0919   LYMPHSABS 3.8 (H) 03/08/2020 0919   MONOABS 0.9 05/05/2018 0750   EOSABS 0.2 03/08/2020 0919   BASOSABS 0.0 03/08/2020 0919     Diabetic Labs (most recent): Lab Results  Component Value Date   HGBA1C 5.8 (H) 03/08/2020    Lipid Panel     Component Value Date/Time   CHOL 186 03/08/2020 0919   TRIG 158 (H) 03/08/2020 0919   HDL 53 03/08/2020 0919   CHOLHDL 3.5 03/08/2020 0919   LDLCALC 105 (H) 03/08/2020 0919   LABVLDL 28 03/08/2020 0919     Lab Results  Component Value Date   TSH 11.900 (H) 01/13/2021   TSH <0.005 (L) 03/08/2020   TSH <0.005 (L) 03/08/2020   FREET4 0.74 (L) 01/13/2021   FREET4 1.41 03/08/2020    Thyroid uptake and scan on April 10, 2020 FINDINGS: Homogeneous tracer distribution in both thyroid lobes.  No focal areas of increased or decreased tracer localization.  4 hour I-123 uptake = 23.7% (normal 5-20%)  ,  24 hour I-123 uptake = 46.9% (normal 10-30%)   IMPRESSION: Normal thyroid scan.  Elevated 4 hour and 24 hour radio iodine uptakes.  Findings consistent with Graves disease.   Assessment & Plan:   1.  RAI induced hypothyroidism 2.  Graves' disease-resolved  -She failed to return for follow-up after her treatment was administered in November 2021.  Her presentation is consistent with treatment effect with RAI induced hypothyroidism.   Based on her recent thyroid function test, she will benefit from a higher dose of levothyroxine.  I discussed and increase her  levothyroxine to 100 mcg p.o. daily before breakfast.   - We discussed about the correct intake of her thyroid hormone, on empty stomach at fasting, with water, separated by at least 30 minutes from breakfast and other medications,  and separated by more than 4 hours from calcium, iron, multivitamins, acid reflux medications (PPIs). -Patient is made aware of the fact that thyroid hormone replacement is needed for life, dose to be adjusted by periodic monitoring of thyroid function tests.     -Patient is advised to maintain close follow up with Anabel Halon, MD for primary care needs.    I spent 21 minutes in the care of the patient today including review of labs from Thyroid Function, CMP, and other relevant labs ; imaging/biopsy records (current and previous including abstractions from other facilities); face-to-face time discussing  her lab results and symptoms, medications doses, her options of short and long term treatment based on the latest standards of care / guidelines;   and documenting the encounter.  Linda Skinner  participated in the discussions, expressed understanding, and voiced agreement with the above plans.  All questions were answered to her satisfaction. she is encouraged to contact clinic should she have any questions or concerns prior to her return visit.   Follow up plan: Return in about 5 weeks (around 02/28/2021) for F/U with Pre-visit Labs.   Thank you for involving me in the care of this pleasant patient, and I will continue to update you with her progress.  Marquis Lunch, MD [facility]  Group Phone: [phone]  Fax: [phone]   01/24/2021, 12:19 PM  This note was partially dictated with voice recognition software. Similar sounding words can be transcribed inadequately or may not  be corrected upon review.

## 2021-01-28 ENCOUNTER — Other Ambulatory Visit: Payer: Self-pay

## 2021-01-28 ENCOUNTER — Telehealth: Payer: Medicaid Other

## 2021-02-11 ENCOUNTER — Other Ambulatory Visit: Payer: Self-pay

## 2021-02-11 ENCOUNTER — Telehealth (INDEPENDENT_AMBULATORY_CARE_PROVIDER_SITE_OTHER): Payer: Medicaid Other | Admitting: Licensed Clinical Social Worker

## 2021-02-11 DIAGNOSIS — F320 Major depressive disorder, single episode, mild: Secondary | ICD-10-CM

## 2021-02-12 ENCOUNTER — Telehealth: Payer: Self-pay | Admitting: Licensed Clinical Social Worker

## 2021-02-12 DIAGNOSIS — F320 Major depressive disorder, single episode, mild: Secondary | ICD-10-CM

## 2021-02-12 NOTE — BH Specialist Note (Signed)
Virtual Behavioral Health Treatment Plan Team Note  MRN: [id number] NAME: Linda Skinner  DATE: 02/12/21  Start time:  225p End time:  230p Total time:  5 min  Total number of Virtual BH Treatment Team Plan encounters: 2/4  Treatment Team Attendees: Nolon Rod, LCSW & Dr. Vanetta Shawl, Psychiatrist  Diagnoses: No diagnosis found.  Goals, Interventions and Follow-up Plan Goals: Increase healthy adjustment to current life circumstances Interventions: Mindfulness or Relaxation Training Behavioral Activation Medication Management Recommendations: no change Follow-up Plan: 2 weeks follow up  History of the present illness Presenting Problem/Current Symptoms: symptoms of depression  Psychiatric History  Depression: No Anxiety: No Mania: No Psychosis: No PTSD symptoms: No  Past Psychiatric History/Hospitalization(s): Hospitalization for psychiatric illness: No Prior Suicide Attempts: No Prior Self-injurious behavior: No  Psychosocial stressors grief & loss  Self-harm Behaviors Risk Assessment   Screenings PHQ-9 Assessments:  Depression screen [facility] 2/9 02/12/2021 01/22/2021 01/13/2021  Decreased Interest 2 2 0  Down, Depressed, Hopeless 3 2 3   PHQ - 2 Score 5 4 3   Altered sleeping 3 2 3   Tired, decreased energy 2 2 3   Change in appetite 2 2 3   Feeling bad or failure about yourself  1 2 3   Trouble concentrating 2 2 2   Moving slowly or fidgety/restless 2 2 3   Suicidal thoughts 0 2 0  PHQ-9 Score 17 18 20   Difficult doing work/chores Somewhat difficult Very difficult Somewhat difficult   GAD-7 Assessments:  GAD 7 : Generalized Anxiety Score 02/12/2021 01/22/2021 05/01/2020  Nervous, Anxious, on Edge 2 0 1  Control/stop worrying 2 0 2  Worry too much - different things 2 2 2   Trouble relaxing 2 2 1   Restless 2 2 1   Easily annoyed or irritable 1 0 3  Afraid - awful might happen 1 0 1  Total GAD 7 Score 12 6 11   Anxiety Difficulty Somewhat difficult Not difficult at all -     Past Medical History Past Medical History:  Diagnosis Date   Encounter for tubal ligation 09/16/2017   Gestational diabetes    metformin   Graves disease    Maternal varicella, non-immune 02/18/2017   Immunize p delivery   Medical history non-contributory    Supervision of high risk pregnancy, antepartum 02/17/2017    Clinic [facility] Initiated Care at  9wk FOB [place] Dating By LMP c/w 1st trimester U/S 7wk Pap 05/23/15 neg [facility] (got records) GC/CT Initial:   -/-             36+wks:-/- Genetic Screen NT/IT: neg CF screen declined Anatomic Normal female Flu vaccine 02/17/17  Tdap Recommended ~ 28wks Glucose Screen  2 hr   99/122/114  GDM GBS pos Feed Preference both Contraception BTL, consent 02/06/1   SVD (spontaneous vaginal delivery) 09/15/2017    Vital signs: There were no vitals filed for this visit.  Allergies:  Allergies as of 02/12/2021   (No Known Allergies)    Medication History Current medications:  Outpatient Encounter Medications as of 02/12/2021  Medication Sig   escitalopram (LEXAPRO) 20 MG tablet Take 1 tablet (20 mg total) by mouth daily.   ketoconazole (NIZORAL) 2 % cream Apply 1 application topically 2 (two) times daily.   levothyroxine (SYNTHROID) 100 MCG tablet Take 1 tablet (100 mcg total) by mouth daily before breakfast.   Vitamin D, Ergocalciferol, (DRISDOL) 1.25 MG (50000 UNIT) CAPS capsule Take 1 capsule (50,000 Units total) by mouth every 7 (seven) days.   No facility-administered encounter medications  on file as of 02/12/2021.     Scribe for Treatment Team: Marinda Elk, LCSW

## 2021-02-12 NOTE — BH Specialist Note (Signed)
 [facility] Follow Up Assessment  MRN: [id number] NAME: Linda Skinner Date: 02/12/21  Start time: 10a End time: 1020a Total time: 20  Type of Contact: Follow up Call  Current concerns/stressors: depression  Screens/Assessment Tools: GAD 7 : Generalized Anxiety Score 02/12/2021 01/22/2021 05/01/2020  Nervous, Anxious, on Edge 2 0 1  Control/stop worrying 2 0 2  Worry too much - different things 2 2 2   Trouble relaxing 2 2 1   Restless 2 2 1   Easily annoyed or irritable 1 0 3  Afraid - awful might happen 1 0 1  Total GAD 7 Score 12 6 11   Anxiety Difficulty Somewhat difficult Not difficult at all -     PHQ9 SCORE ONLY 02/12/2021 01/22/2021 01/13/2021  PHQ-9 Total Score 17 18 20      Functional Assessment:  Sleep: poor Appetite: fair Coping ability: overwhelmed Patient taking medications as prescribed:  yes  Current medications:  Outpatient Encounter Medications as of 02/11/2021  Medication Sig   escitalopram (LEXAPRO) 20 MG tablet Take 1 tablet (20 mg total) by mouth daily.   ketoconazole (NIZORAL) 2 % cream Apply 1 application topically 2 (two) times daily.   levothyroxine (SYNTHROID) 100 MCG tablet Take 1 tablet (100 mcg total) by mouth daily before breakfast.   Vitamin D, Ergocalciferol, (DRISDOL) 1.25 MG (50000 UNIT) CAPS capsule Take 1 capsule (50,000 Units total) by mouth every 7 (seven) days.   No facility-administered encounter medications on file as of 02/11/2021.    Self-harm and/or Suicidal Behaviors Risk Assessment Self-harm risk factors: no Patient endorses recent self injurious thoughts and/or behaviors: No   Suicide ideations: No plan to harm self or others   Danger to Others Risk Assessment Danger to others risk factors: no Patient endorses recent thoughts of harming others: No    Substance Use Assessment Patient recently consumed alcohol: No  Patient recently used drugs: No  Patient is concerned about dependence or abuse of substances: No     Goals, Interventions and Follow-up Plan Goals: Increase healthy adjustment to current life circumstances Interventions: Mindfulness or Relaxation Training and Behavioral Activation   Summary of Clinical Assessment  Linda Skinner is a 30 yr old woman for follow up.  Factors that contribute to client's ongoing depressive symptoms were discussed and include real and perceived feelings of isolation, criticism, rejection, shame and guilt. She has accepted a new job working from which will assist with structure.  She reports feeling slightly better than her last visit.  She has been able to get out of the home about once weekly and has made a few changes in her daily pattern.  She continues to have depressive symptoms such as increase sleep, lack of motivation and sadness/crying spells   Follow-up Plan:  2 weeks follow up 02/13/2021, LCSW

## 2021-02-22 LAB — T4, FREE: Free T4: 1.16 ng/dL (ref 0.82–1.77)

## 2021-02-22 LAB — TSH: TSH: 1.66 u[IU]/mL (ref 0.450–4.500)

## 2021-02-25 ENCOUNTER — Telehealth (INDEPENDENT_AMBULATORY_CARE_PROVIDER_SITE_OTHER): Payer: Medicaid Other | Admitting: Licensed Clinical Social Worker

## 2021-02-25 ENCOUNTER — Other Ambulatory Visit: Payer: Self-pay

## 2021-02-25 DIAGNOSIS — F320 Major depressive disorder, single episode, mild: Secondary | ICD-10-CM

## 2021-02-27 ENCOUNTER — Ambulatory Visit (INDEPENDENT_AMBULATORY_CARE_PROVIDER_SITE_OTHER): Payer: Medicaid Other | Admitting: "Endocrinology

## 2021-02-27 ENCOUNTER — Encounter: Payer: Self-pay | Admitting: "Endocrinology

## 2021-02-27 ENCOUNTER — Other Ambulatory Visit: Payer: Self-pay

## 2021-02-27 VITALS — BP 122/78 | HR 80 | Ht 64.0 in | Wt 303.8 lb

## 2021-02-27 DIAGNOSIS — E890 Postprocedural hypothyroidism: Secondary | ICD-10-CM

## 2021-02-27 DIAGNOSIS — E0500 Thyrotoxicosis with diffuse goiter without thyrotoxic crisis or storm: Secondary | ICD-10-CM | POA: Diagnosis not present

## 2021-02-27 MED ORDER — LEVOTHYROXINE SODIUM 100 MCG PO TABS: 100.0000 ug | ORAL_TABLET | Freq: Every day | ORAL | 1 refills | Status: DC

## 2021-02-27 NOTE — Progress Notes (Signed)
02/27/2021    Endocrinology follow-up note  Subjective:    Patient ID: Linda Skinner, female    DOB: Jan 25, 1991, PCP Linda Halon, MD.   Past Medical History:  Diagnosis Date   Encounter for tubal ligation 09/16/2017   Gestational diabetes    metformin   Graves disease    Maternal varicella, non-immune 02/18/2017   Immunize p delivery   Medical history non-contributory    Supervision of high risk pregnancy, antepartum 02/17/2017    [facility] at  [facility] FOB Linda Skinner Dating By LMP c/w 1st trimester U/S 7wk Pap 05/23/15 neg [facility]CHD (got records) GC/CT Initial:   -/-             36+wks:-/- Genetic Screen NT/IT: neg CF screen declined Anatomic [place] Normal female Flu vaccine 02/17/17  Tdap Recommended ~ 28wks Glucose Screen  2 hr   99/122/114  GDM GBS pos Feed Preference both Contraception BTL, consent 02/06/1   SVD (spontaneous vaginal delivery) 09/15/2017    Past Surgical History:  Procedure Laterality Date   MOUTH SURGERY     TUBAL LIGATION N/A 09/15/2017   Procedure: POST PARTUM TUBAL LIGATION;  Surgeon: Linda Running, MD;  Location: [facility] BIRTHING SUITES;  Service: Gynecology;  Laterality: N/A;   WISDOM TOOTH EXTRACTION      Social History   Socioeconomic History   Marital status: Single    Spouse name: Not on file   Number of children: Not on file   Years of education: Not on file   Highest education level: Not on file  Occupational History   Not on file  Tobacco Use   Smoking status: Former    Packs/day: 0.01    Years: 4.00    Pack years: 0.04    Types: Cigars, Cigarettes   Smokeless tobacco: Never  Vaping Use   Vaping Use: Never used  Substance and Sexual Activity   Alcohol use: No   Drug use: No   Sexual activity: Not Currently    Birth control/protection: Surgical    Comment: tubal  Other Topics Concern   Not on file  Social History Narrative   Not on file   Social Determinants of Health   Financial Resource Strain: Low  Risk    Difficulty of Paying Living Expenses: Not hard at all  Food Insecurity: No Food Insecurity   Worried About Programme researcher, broadcasting/film/video in the Last Year: Never true   Ran Out of Food in the Last Year: Never true  Transportation Needs: No Transportation Needs   Lack of Transportation (Medical): No   Lack of Transportation (Non-Medical): No  Physical Activity: Insufficiently Active   Days of Exercise per Week: 3 days   Minutes of Exercise per Session: 20 min  Stress: Stress Concern Present   Feeling of Stress : To some extent  Social Connections: Socially Isolated   Frequency of Communication with Friends and Family: Twice a week   Frequency of Social Gatherings with Friends and Family: Twice a week   Attends Religious Services: Never   Database administrator or Organizations: No   Attends Engineer, structural: Never   Marital Status: Never married    Family History  Problem Relation Age of Onset   Lupus Mother    Cancer Paternal Grandfather    Atrial fibrillation Maternal Grandmother     Outpatient Encounter Medications as of 02/27/2021  Medication Sig   escitalopram (LEXAPRO) 20 MG tablet Take 1  tablet (20 mg total) by mouth daily.   ketoconazole (NIZORAL) 2 % cream Apply 1 application topically 2 (two) times daily. (Patient not taking: Reported on 02/27/2021)   levothyroxine (SYNTHROID) 100 MCG tablet Take 1 tablet (100 mcg total) by mouth daily before breakfast.   Vitamin D, Ergocalciferol, (DRISDOL) 1.25 MG (50000 UNIT) CAPS capsule Take 1 capsule (50,000 Units total) by mouth every 7 (seven) days.   [DISCONTINUED] levothyroxine (SYNTHROID) 100 MCG tablet Take 1 tablet (100 mcg total) by mouth daily before breakfast.   No facility-administered encounter medications on file as of 02/27/2021.    ALLERGIES: No Known Allergies  VACCINATION STATUS: Immunization History  Administered Date(s) Administered   Influenza,inj,Quad PF,6+ Mos 02/17/2017, 03/01/2020    PFIZER(Purple Top)SARS-COV-2 Vaccination 02/07/2020, 03/01/2020   PPD Test 03/11/2020, 03/25/2020   Tdap 09/16/2017     HPI  Linda Skinner is 30 y.o. female who presents today with her new labs and uptake and scan of her thyroid.    she was recently seen in consultation for hyperthyroidism requested by Linda Halon, MD. After appropriate work-up confirmed primary hyperthyroidism from Graves' disease, she was sent for radioactive iodine ablation of her thyroid.  Treatment was administered in November 2021.  Subsequently she developed hypothyroidism and was started on levothyroxine 100 mcg p.o. daily before breakfast.  She reports compliance with medication.  She has no new complaints today.  Her previsit thyroid function tests are consistent with appropriate replacement. She denies palpitations, tremor, nor heat intolerance at this time.  she denies family history of thyroid dysfunction. -She denies family hx of thyroid cancer. she denies personal history of goiter.she  is willing to proceed with appropriate work up and therapy for thyrotoxicosis.                           Review of systems  Constitutional: + Weight gain, + fatigue.      Objective:    BP 122/78   Pulse 80   Ht 5\' 4"  (1.626 m)   Wt (!) 303 lb 12.8 oz (137.8 kg)   BMI 52.15 kg/m   Wt Readings from Last 3 Encounters:  02/27/21 (!) 303 lb 12.8 oz (137.8 kg)  01/24/21 (!) 303 lb (137.4 kg)  01/13/21 (!) 305 lb 0.6 oz (138.4 kg)                                                Physical exam  Constitutional: Body mass index is 52.15 kg/m., not in acute distress, + normal state of mind   CMP     Component Value Date/Time   NA 140 01/13/2021 1414   K 4.4 01/13/2021 1414   CL 104 01/13/2021 1414   CO2 21 01/13/2021 1414   GLUCOSE 96 01/13/2021 1414   GLUCOSE 108 (H) 05/05/2018 0750   BUN 7 01/13/2021 1414   CREATININE 0.72 01/13/2021 1414   CALCIUM 9.6 01/13/2021 1414   PROT 7.3 01/13/2021 1414    ALBUMIN 4.3 01/13/2021 1414   AST 19 01/13/2021 1414   ALT 11 01/13/2021 1414   ALKPHOS 61 01/13/2021 1414   BILITOT <0.2 01/13/2021 1414   GFRNONAA 118 03/08/2020 0919   GFRAA 136 03/08/2020 0919     CBC    Component Value Date/Time   WBC 7.2 03/08/2020 0919   WBC  10.3 05/05/2018 0750   RBC 4.54 03/08/2020 0919   RBC 4.48 05/05/2018 0750   HGB 12.0 03/08/2020 0919   HCT 36.4 03/08/2020 0919   PLT 264 03/08/2020 0919   MCV 80 03/08/2020 0919   MCH 26.4 (L) 03/08/2020 0919   MCH 25.4 (L) 05/05/2018 0750   MCHC 33.0 03/08/2020 0919   MCHC 31.8 05/05/2018 0750   RDW 13.2 03/08/2020 0919   LYMPHSABS 3.8 (H) 03/08/2020 0919   MONOABS 0.9 05/05/2018 0750   EOSABS 0.2 03/08/2020 0919   BASOSABS 0.0 03/08/2020 0919    Diabetic Labs (most recent): Lab Results  Component Value Date   HGBA1C 5.8 (H) 03/08/2020    Lipid Panel     Component Value Date/Time   CHOL 186 03/08/2020 0919   TRIG 158 (H) 03/08/2020 0919   HDL 53 03/08/2020 0919   CHOLHDL 3.5 03/08/2020 0919   LDLCALC 105 (H) 03/08/2020 0919   LABVLDL 28 03/08/2020 0919     Lab Results  Component Value Date   TSH 1.660 02/21/2021   TSH 11.900 (H) 01/13/2021   TSH <0.005 (L) 03/08/2020   TSH <0.005 (L) 03/08/2020   FREET4 1.16 02/21/2021   FREET4 0.74 (L) 01/13/2021   FREET4 1.41 03/08/2020    Thyroid uptake and scan on April 10, 2020 FINDINGS: Homogeneous tracer distribution in both thyroid lobes.  No focal areas of increased or decreased tracer localization.  4 hour I-123 uptake = 23.7% (normal 5-20%)  ,  24 hour I-123 uptake = 46.9% (normal 10-30%)   IMPRESSION: Normal thyroid scan.  Elevated 4 hour and 24 hour radio iodine uptakes.  Findings consistent with Graves disease.   Assessment & Plan:   1.  RAI induced hypothyroidism 2.  Graves' disease-resolved  -She failed to return for follow-up after her treatment was administered in November 2021.  Her previsit thyroid function tests are  consistent with appropriate replacement for hypothyroidism following RAI thyroid ablation  She is advised to continue levothyroxine  100 mcg p.o. daily before breakfast.   - We discussed about the correct intake of her thyroid hormone, on empty stomach at fasting, with water, separated by at least 30 minutes from breakfast and other medications,  and separated by more than 4 hours from calcium, iron, multivitamins, acid reflux medications (PPIs). -Patient is made aware of the fact that thyroid hormone replacement is needed for life, dose to be adjusted by periodic monitoring of thyroid function tests.    -Patient is advised to maintain close follow up with Linda Halon, MD for primary care needs.   I spent 21 minutes in the care of the patient today including review of labs from Thyroid Function, CMP, and other relevant labs ; imaging/biopsy records (current and previous including abstractions from other facilities); face-to-face time discussing  her lab results and symptoms, medications doses, her options of short and long term treatment based on the latest standards of care / guidelines;   and documenting the encounter.  Linda Skinner  participated in the discussions, expressed understanding, and voiced agreement with the above plans.  All questions were answered to her satisfaction. she is encouraged to contact clinic should she have any questions or concerns prior to her return visit.   Follow up plan: Return in about 4 months (around 06/29/2021) for F/U with Pre-visit Labs.   Thank you for involving me in the care of this pleasant patient, and I will continue to update you with her progress.  Marquis Lunch, MD [place] [facility] Phone: [phone]  Fax: [phone]   02/27/2021, 11:43 AM  This note was partially dictated with voice recognition software. Similar sounding words can be transcribed inadequately or may not  be corrected upon  review.

## 2021-02-28 ENCOUNTER — Ambulatory Visit: Payer: Medicaid Other | Admitting: "Endocrinology

## 2021-02-28 NOTE — Progress Notes (Signed)
reschedule

## 2021-03-02 NOTE — Progress Notes (Addendum)
Patient: Home  Provider: Remote Office                 Virtual Visit via Telephone Note   I connected with Linda Skinner on 01/21/21 by telephone and verified that I am speaking with the correct person using two identifiers.     [place] [facility] Follow Up Assessment  MRN: [id number] NAME: Linda Skinner Date: 03/02/21  Start time: 930 End time: 10 Total time: 30  Type of Contact: Follow up Call  Current concerns/stressors: depression  Screens/Assessment Tools: GAD 7 : Generalized Anxiety Score 02/12/2021 01/22/2021 05/01/2020  Nervous, Anxious, on Edge 2 0 1  Control/stop worrying 2 0 2  Worry too much - different things 2 2 2   Trouble relaxing 2 2 1   Restless 2 2 1   Easily annoyed or irritable 1 0 3  Afraid - awful might happen 1 0 1  Total GAD 7 Score 12 6 11   Anxiety Difficulty Somewhat difficult Not difficult at all -     PHQ9 SCORE ONLY 02/12/2021 01/22/2021 01/13/2021  PHQ-9 Total Score 17 18 20      How difficult have these problems made it for you to do your work, take care of things at home, or get along with other people? somewhat  Functional Assessment:  Sleep: poor  Appetite: poor Coping ability: overwhelmed Patient taking medications as prescribed:  yes  Current medications:  Outpatient Encounter Medications as of 01/21/2021  Medication Sig   escitalopram (LEXAPRO) 20 MG tablet Take 1 tablet (20 mg total) by mouth daily.   ketoconazole (NIZORAL) 2 % cream Apply 1 application topically 2 (two) times daily. (Patient not taking: Reported on 02/27/2021)   Vitamin D, Ergocalciferol, (DRISDOL) 1.25 MG (50000 UNIT) CAPS capsule Take 1 capsule (50,000 Units total) by mouth every 7 (seven) days.   [DISCONTINUED] levothyroxine (SYNTHROID) 25 MCG tablet Take 1 tablet (25 mcg total) by mouth daily.   No facility-administered encounter medications on file as of 01/21/2021.    Self-harm and/or Suicidal Behaviors Risk Assessment Self-harm risk factors:  no Patient endorses recent self injurious thoughts and/or behaviors: No   Suicide ideations: No plan to harm self or others   Danger to Others Risk Assessment Danger to others risk factors: no Patient endorses recent thoughts of harming others: No    Substance Use Assessment Patient recently consumed alcohol: No  Patient recently used drugs: No  Patient is concerned about dependence or abuse of substances: No    Goals, Interventions and Follow-up Plan Goals: Increase healthy adjustment to current life circumstances Interventions: Behavioral Activation and CBT Cognitive Behavioral Therapy   Summary of Clinical Assessment  Linda Skinner is a follow up for depression. She reports that she continues to have difficulty with caring for herself and her children.  She continues to call out of work and have her sister care for her children when she is "down." Factors that contribute to client's ongoing depressive symptoms were discussed and include real and perceived feelings of isolation, criticism, rejection, shame and guilt.    Follow-up Plan:  biweekly follow up , LCSW

## 2021-03-05 ENCOUNTER — Ambulatory Visit (INDEPENDENT_AMBULATORY_CARE_PROVIDER_SITE_OTHER): Payer: Medicaid Other | Admitting: Licensed Clinical Social Worker

## 2021-03-05 ENCOUNTER — Other Ambulatory Visit: Payer: Self-pay

## 2021-03-05 DIAGNOSIS — F320 Major depressive disorder, single episode, mild: Secondary | ICD-10-CM

## 2021-03-19 ENCOUNTER — Other Ambulatory Visit: Payer: Self-pay

## 2021-03-19 ENCOUNTER — Ambulatory Visit (INDEPENDENT_AMBULATORY_CARE_PROVIDER_SITE_OTHER): Payer: Medicaid Other | Admitting: Licensed Clinical Social Worker

## 2021-03-19 DIAGNOSIS — F320 Major depressive disorder, single episode, mild: Secondary | ICD-10-CM

## 2021-03-19 NOTE — Progress Notes (Signed)
Unable to leave vmail; will call back

## 2021-03-19 NOTE — BH Specialist Note (Signed)
[place] [facility] Follow Up Assessment  MRN: [id number] NAME: Linda Skinner Date: 03/19/21  Start time: 1230p End time: 1245p Total time: 15  Type of Contact: Follow up Call  Current concerns/stressors: depression  Screens/Assessment Tools:   Functional Assessment:  Sleep: fair Appetite: fair Coping ability: overwhelmed Patient taking medications as prescribed:    Current medications:  Outpatient Encounter Medications as of 03/05/2021  Medication Sig   escitalopram (LEXAPRO) 20 MG tablet Take 1 tablet (20 mg total) by mouth daily.   ketoconazole (NIZORAL) 2 % cream Apply 1 application topically 2 (two) times daily. (Patient not taking: Reported on 02/27/2021)   levothyroxine (SYNTHROID) 100 MCG tablet Take 1 tablet (100 mcg total) by mouth daily before breakfast.   Vitamin D, Ergocalciferol, (DRISDOL) 1.25 MG (50000 UNIT) CAPS capsule Take 1 capsule (50,000 Units total) by mouth every 7 (seven) days.   No facility-administered encounter medications on file as of 03/05/2021.    Self-harm and/or Suicidal Behaviors Risk Assessment Self-harm risk factors: no Patient endorses recent self injurious thoughts and/or behaviors: No   Suicide ideations: No plan to harm self or others   Danger to Others Risk Assessment Danger to others risk factors: no Patient endorses recent thoughts of harming others: No    Substance Use Assessment Patient recently consumed alcohol: No  Patient recently used drugs: No  Patient is concerned about dependence or abuse of substances: No    Goals, Interventions and Follow-up Plan Goals: Increase healthy adjustment to current life circumstances Interventions: Behavioral Activation and CBT Cognitive Behavioral Therapy   Summary of Clinical Assessment  Linda Skinner is a 30 yr old woman who was present for a follow up.  Patient was open about her triggers, stressors and coping skills. Will continue to provide psychoeducation   Follow-up Plan:   bi weekly vbh sessions Linda Elk, LCSW

## 2021-03-27 ENCOUNTER — Ambulatory Visit (INDEPENDENT_AMBULATORY_CARE_PROVIDER_SITE_OTHER): Payer: Medicaid Other | Admitting: Licensed Clinical Social Worker

## 2021-03-27 ENCOUNTER — Other Ambulatory Visit: Payer: Self-pay

## 2021-03-27 DIAGNOSIS — F320 Major depressive disorder, single episode, mild: Secondary | ICD-10-CM

## 2021-04-02 ENCOUNTER — Telehealth (INDEPENDENT_AMBULATORY_CARE_PROVIDER_SITE_OTHER): Payer: Medicaid Other | Admitting: Licensed Clinical Social Worker

## 2021-04-02 DIAGNOSIS — F320 Major depressive disorder, single episode, mild: Secondary | ICD-10-CM

## 2021-04-02 NOTE — BH Specialist Note (Signed)
Virtual Behavioral Health Treatment Plan Team Note  MRN: [id number] NAME: Linda Skinner  DATE: 04/02/21  Start time:  235 End time:  240 Total time:  5 min  Total number of Virtual BH Treatment Team Plan encounters: 3/4  Treatment Team Attendees: Nolon Rod, LCSW & Dr. Vanetta Shawl  Diagnoses: No diagnosis found.  Goals, Interventions and Follow-up Plan Goals: Increase healthy adjustment to current life circumstances Interventions: Biweekly vbh sessions Medication Management Recommendations: no change in medication Follow-up Plan: Biweekly VBH sessions  History of the present illness Presenting Problem/Current Symptoms: depressive symptoms  Psychiatric History  Depression: No Anxiety: No Mania: No Psychosis: No PTSD symptoms: No  Past Psychiatric History/Hospitalization(s): Hospitalization for psychiatric illness: No Prior Suicide Attempts: No Prior Self-injurious behavior: No  Psychosocial stressors poor sleep, recurrent thoughts  Self-harm Behaviors Risk Assessment   Screenings PHQ-9 Assessments:  Depression screen [facility] 2/9 04/02/2021 02/12/2021 01/22/2021  Decreased Interest 2 2 2   Down, Depressed, Hopeless 3 3 2   PHQ - 2 Score 5 5 4   Altered sleeping 2 3 2   Tired, decreased energy 2 2 2   Change in appetite 1 2 2   Feeling bad or failure about yourself  2 1 2   Trouble concentrating 2 2 2   Moving slowly or fidgety/restless 2 2 2   Suicidal thoughts 0 0 2  PHQ-9 Score 16 17 18   Difficult doing work/chores Very difficult Somewhat difficult Very difficult   GAD-7 Assessments:  GAD 7 : Generalized Anxiety Score 04/02/2021 02/12/2021 01/22/2021 05/01/2020  Nervous, Anxious, on Edge 0 2 0 1  Control/stop worrying 0 2 0 2  Worry too much - different things 0 2 2 2   Trouble relaxing 0 2 2 1   Restless 0 2 2 1   Easily annoyed or irritable 0 1 0 3  Afraid - awful might happen 0 1 0 1  Total GAD 7 Score 0 12 6 11   Anxiety Difficulty Not difficult at all Somewhat  difficult Not difficult at all -    Past Medical History Past Medical History:  Diagnosis Date   Encounter for tubal ligation 09/16/2017   Gestational diabetes    metformin   Graves disease    Maternal varicella, non-immune 02/18/2017   Immunize p delivery   Medical history non-contributory    Supervision of high risk pregnancy, antepartum 02/17/2017    [facility] at  9wk FOB [place] Dating By LMP c/w 1st trimester U/S 7wk Pap 05/23/15 neg [facility]CHD (got records) GC/CT Initial:   -/-             36+wks:-/- Genetic Screen NT/IT: neg CF screen declined Anatomic Normal female Flu vaccine 02/17/17  Tdap Recommended ~ 28wks Glucose Screen  2 hr   99/122/114  GDM GBS pos Feed Preference both Contraception BTL, consent 02/06/1   SVD (spontaneous vaginal delivery) 09/15/2017    Vital signs: There were no vitals filed for this visit.  Allergies:  Allergies as of 04/02/2021   (No Known Allergies)    Medication History Current medications:  Outpatient Encounter Medications as of 04/02/2021  Medication Sig   escitalopram (LEXAPRO) 20 MG tablet Take 1 tablet (20 mg total) by mouth daily.   ketoconazole (NIZORAL) 2 % cream Apply 1 application topically 2 (two) times daily. (Patient not taking: Reported on 02/27/2021)   levothyroxine (SYNTHROID) 100 MCG tablet Take 1 tablet (100 mcg total) by mouth daily before breakfast.   Vitamin D, Ergocalciferol, (DRISDOL) 1.25 MG (50000 UNIT) CAPS capsule Take 1  capsule (50,000 Units total) by mouth every 7 (seven) days.   No facility-administered encounter medications on file as of 04/02/2021.     Scribe for Treatment Team: Lubertha South, LCSW

## 2021-04-02 NOTE — BH Specialist Note (Signed)
[place] [facility] Follow Up Assessment  MRN: [id number] NAME: Linda Skinner Date: 04/02/21  Start time: 12 End time: 1220 Total time: 20  Type of Contact: Follow up Call  Current concerns/stressors: depressed mood  Screens/Assessment Tools: PHQ9 SCORE ONLY 02/12/2021 01/22/2021 01/13/2021  PHQ-9 Total Score 17 18 20     Functional Assessment:  Sleep: poor Appetite: fair Coping ability: overwhelmed Patient taking medications as prescribed:    Current medications:  Outpatient Encounter Medications as of 03/27/2021  Medication Sig   escitalopram (LEXAPRO) 20 MG tablet Take 1 tablet (20 mg total) by mouth daily.   ketoconazole (NIZORAL) 2 % cream Apply 1 application topically 2 (two) times daily. (Patient not taking: Reported on 02/27/2021)   levothyroxine (SYNTHROID) 100 MCG tablet Take 1 tablet (100 mcg total) by mouth daily before breakfast.   Vitamin D, Ergocalciferol, (DRISDOL) 1.25 MG (50000 UNIT) CAPS capsule Take 1 capsule (50,000 Units total) by mouth every 7 (seven) days.   No facility-administered encounter medications on file as of 03/27/2021.    Self-harm and/or Suicidal Behaviors Risk Assessment Self-harm risk factors: no Patient endorses recent self injurious thoughts and/or behaviors: No   Suicide ideations: No plan to harm self or others   Danger to Others Risk Assessment Danger to others risk factors: no Patient endorses recent thoughts of harming others: No    Substance Use Assessment Patient recently consumed alcohol: No  Patient recently used drugs: No  Patient is concerned about dependence or abuse of substances: No    Goals, Interventions and Follow-up Plan Goals: Increase healthy adjustment to current life circumstances Interventions:  Biweekly vbh sessions   Summary of Clinical Assessment  Linda Skinner was present for her followup appt.  She continues to express depressive symptoms such as: anhedonia, increase sleep, lack of motivation,  decreased concentration.  Sravya currently works fulltime and attends work regularly.  She has 3 children that family helps her with due to her getting off work at 6p.  She continues to lay in the bed when depressive thoughts arrive.  She has frequent thoughts of her past relationships and which triggers her low mood. She takes medication as directed.  Danamarie reports that when she has bad days she will go to work to "get my mind off of the thoughts."   Follow-up Plan:  Biweekly VBH sessions Misty Stanley, LCSW

## 2021-04-08 NOTE — Progress Notes (Signed)
Patient: Home  Provider: Home office Telephone visit   

## 2021-04-10 ENCOUNTER — Other Ambulatory Visit: Payer: Self-pay

## 2021-04-10 ENCOUNTER — Ambulatory Visit: Payer: Medicaid Other | Admitting: Licensed Clinical Social Worker

## 2021-04-15 ENCOUNTER — Encounter: Payer: Self-pay | Admitting: Internal Medicine

## 2021-04-15 ENCOUNTER — Other Ambulatory Visit: Payer: Self-pay

## 2021-04-15 ENCOUNTER — Ambulatory Visit (INDEPENDENT_AMBULATORY_CARE_PROVIDER_SITE_OTHER): Payer: Medicaid Other | Admitting: Internal Medicine

## 2021-04-15 VITALS — BP 137/85 | HR 81 | Temp 99.2°F | Resp 18 | Ht 64.0 in | Wt 289.1 lb

## 2021-04-15 DIAGNOSIS — Z0001 Encounter for general adult medical examination with abnormal findings: Secondary | ICD-10-CM

## 2021-04-15 DIAGNOSIS — R7303 Prediabetes: Secondary | ICD-10-CM

## 2021-04-15 DIAGNOSIS — E559 Vitamin D deficiency, unspecified: Secondary | ICD-10-CM

## 2021-04-15 DIAGNOSIS — E039 Hypothyroidism, unspecified: Secondary | ICD-10-CM | POA: Insufficient documentation

## 2021-04-15 DIAGNOSIS — F320 Major depressive disorder, single episode, mild: Secondary | ICD-10-CM | POA: Diagnosis not present

## 2021-04-15 DIAGNOSIS — E890 Postprocedural hypothyroidism: Secondary | ICD-10-CM | POA: Diagnosis not present

## 2021-04-15 DIAGNOSIS — Z23 Encounter for immunization: Secondary | ICD-10-CM

## 2021-04-15 NOTE — Patient Instructions (Addendum)
Please continue to follow low carb diet and perform moderate exercise/walking at least 150 mins/week.  Please continue to take medications as prescribed.  Please get fasting blood tests done before the next visit.

## 2021-04-15 NOTE — Progress Notes (Signed)
Established Patient Office Visit  Subjective:  Patient ID: Linda Skinner, female    DOB: Oct 17, 1990  Age: 30 y.o. MRN: [id number]  CC:  Chief Complaint  Patient presents with   Annual Exam    Annual exam     HPI Linda Skinner is a 30 y.o. female with past medical history of Grave's disease and major depressive disorder who presents for annual physical.  She has been doing well overall.  She has lost about 16 pounds from the last visit.  She had radioactive iodine therapy for Graves' disease in 04/2020.  She is on Levothyroxine 100 mcg QD.  She received flu vaccine in the office today.    Past Medical History:  Diagnosis Date   Encounter for tubal ligation 09/16/2017   Gestational diabetes    metformin   Graves disease    Maternal varicella, non-immune 02/18/2017   Immunize p delivery   Medical history non-contributory    Supervision of high risk pregnancy, antepartum 02/17/2017    [place] Initiated Care at  [facility] [place] Dating By LMP c/w 1st trimester U/S 7wk Pap 05/23/15 neg [facility]CHD (got records) GC/CT Initial:   -/-             36+wks:-/- Genetic Screen NT/IT: neg CF screen declined Anatomic [place] Normal female Flu vaccine 02/17/17  Tdap Recommended ~ 28wks Glucose Screen  2 hr   99/122/114  GDM GBS pos Feed Preference both Contraception BTL, consent 02/06/1   SVD (spontaneous vaginal delivery) 09/15/2017    Past Surgical History:  Procedure Laterality Date   MOUTH SURGERY     TUBAL LIGATION N/A 09/15/2017   Procedure: POST PARTUM TUBAL LIGATION;  Surgeon: Gwynne Edinger, MD;  Location: [facility];  Service: Gynecology;  Laterality: N/A;   WISDOM TOOTH EXTRACTION      Family History  Problem Relation Age of Onset   Lupus Mother    Cancer Paternal Grandfather    Atrial fibrillation Maternal Grandmother     Social History   Socioeconomic History   Marital status: Single    Spouse name: Not on file   Number of children: Not on file   Years  of education: Not on file   Highest education level: Not on file  Occupational History   Not on file  Tobacco Use   Smoking status: Former    Packs/day: 0.01    Years: 4.00    Pack years: 0.04    Types: Cigars, Cigarettes   Smokeless tobacco: Never  Vaping Use   Vaping Use: Never used  Substance and Sexual Activity   Alcohol use: No   Drug use: No   Sexual activity: Not Currently    Birth control/protection: Surgical    Comment: tubal  Other Topics Concern   Not on file  Social History Narrative   Not on file   Social Determinants of Health   Financial Resource Strain: Low Risk    Difficulty of Paying Living Expenses: Not hard at all  Food Insecurity: No Food Insecurity   Worried About Charity fundraiser in the Last Year: Never true   Ran Out of Food in the Last Year: Never true  Transportation Needs: No Transportation Needs   Lack of Transportation (Medical): No   Lack of Transportation (Non-Medical): No  Physical Activity: Insufficiently Active   Days of Exercise per Week: 3 days   Minutes of Exercise per Session: 20 min  Stress: Stress Concern Present  Feeling of Stress : To some extent  Social Connections: Socially Isolated   Frequency of Communication with Friends and Family: Twice a week   Frequency of Social Gatherings with Friends and Family: Twice a week   Attends Religious Services: Never   Marine scientist or Organizations: No   Attends Music therapist: Never   Marital Status: Never married  Human resources officer Violence: Not At Risk   Fear of Current or Ex-Partner: No   Emotionally Abused: No   Physically Abused: No   Sexually Abused: No    Outpatient Medications Prior to Visit  Medication Sig Dispense Refill   escitalopram (LEXAPRO) 20 MG tablet Take 1 tablet (20 mg total) by mouth daily. 90 tablet 1   levothyroxine (SYNTHROID) 100 MCG tablet Take 1 tablet (100 mcg total) by mouth daily before breakfast. 90 tablet 1   Vitamin D,  Ergocalciferol, (DRISDOL) 1.25 MG (50000 UNIT) CAPS capsule Take 1 capsule (50,000 Units total) by mouth every 7 (seven) days. 5 capsule 5   ketoconazole (NIZORAL) 2 % cream Apply 1 application topically 2 (two) times daily. (Patient not taking: No sig reported) 30 g 0   No facility-administered medications prior to visit.    No Known Allergies  ROS Review of Systems  Constitutional:  Negative for chills and fever.  HENT:  Negative for congestion, sinus pressure, sinus pain and sore throat.   Eyes:  Negative for pain and discharge.  Respiratory:  Negative for cough and shortness of breath.   Cardiovascular:  Negative for chest pain and palpitations.  Gastrointestinal:  Negative for abdominal pain, constipation, diarrhea, nausea and vomiting.  Endocrine: Negative for polydipsia and polyuria.  Genitourinary:  Negative for dysuria and hematuria.  Musculoskeletal:  Negative for neck pain and neck stiffness.  Skin:  Negative for rash.  Neurological:  Negative for dizziness and weakness.  Psychiatric/Behavioral:  Negative for agitation, sleep disturbance and suicidal ideas.      Objective:    Physical Exam Vitals reviewed.  Constitutional:      General: She is not in acute distress.    Appearance: She is obese. She is not diaphoretic.  HENT:     Head: Normocephalic and atraumatic.     Nose: Nose normal.     Mouth/Throat:     Mouth: Mucous membranes are moist.  Eyes:     General: No scleral icterus.    Extraocular Movements: Extraocular movements intact.  Cardiovascular:     Rate and Rhythm: Normal rate and regular rhythm.     Pulses: Normal pulses.     Heart sounds: No murmur heard. Pulmonary:     Breath sounds: Normal breath sounds. No wheezing or rales.  Abdominal:     Palpations: Abdomen is soft.     Tenderness: There is no abdominal tenderness.  Musculoskeletal:     Cervical back: Normal range of motion and neck supple. No rigidity or tenderness.     Right lower leg:  No edema.     Left lower leg: No edema.  Skin:    General: Skin is warm.     Coloration: Skin is not pale.  Neurological:     General: No focal deficit present.     Mental Status: She is alert and oriented to person, place, and time.     Cranial Nerves: No cranial nerve deficit.     Sensory: No sensory deficit.     Motor: No weakness.  Psychiatric:  Mood and Affect: Mood normal.        Behavior: Behavior normal.    BP 137/85 (BP Location: Left Arm, Patient Position: Sitting, Cuff Size: Normal)   Pulse 81   Temp 99.2 F (37.3 C) (Oral)   Resp 18   Ht $R'5\' 4"'AW$  (1.626 m)   Wt 289 lb 1.9 oz (131.1 kg)   SpO2 99%   BMI 49.63 kg/m  Wt Readings from Last 3 Encounters:  04/15/21 289 lb 1.9 oz (131.1 kg)  02/27/21 (!) 303 lb 12.8 oz (137.8 kg)  01/24/21 (!) 303 lb (137.4 kg)    Lab Results  Component Value Date   TSH 1.660 02/21/2021   Lab Results  Component Value Date   WBC 7.2 03/08/2020   HGB 12.0 03/08/2020   HCT 36.4 03/08/2020   MCV 80 03/08/2020   PLT 264 03/08/2020   Lab Results  Component Value Date   NA 140 01/13/2021   K 4.4 01/13/2021   CO2 21 01/13/2021   GLUCOSE 96 01/13/2021   BUN 7 01/13/2021   CREATININE 0.72 01/13/2021   BILITOT <0.2 01/13/2021   ALKPHOS 61 01/13/2021   AST 19 01/13/2021   ALT 11 01/13/2021   PROT 7.3 01/13/2021   ALBUMIN 4.3 01/13/2021   CALCIUM 9.6 01/13/2021   ANIONGAP 9 05/05/2018   EGFR 115 01/13/2021   Lab Results  Component Value Date   CHOL 186 03/08/2020   Lab Results  Component Value Date   HDL 53 03/08/2020   Lab Results  Component Value Date   LDLCALC 105 (H) 03/08/2020   Lab Results  Component Value Date   TRIG 158 (H) 03/08/2020   Lab Results  Component Value Date   CHOLHDL 3.5 03/08/2020   Lab Results  Component Value Date   HGBA1C 5.8 (H) 03/08/2020      Assessment & Plan:   Problem List Items Addressed This Visit       Encounter for general adult medical examination with abnormal  findings - Primary   Physical exam as mentioned above. Flu vaccine today. PAP smear in 2021     Relevant Orders  Lipid panel  CMP14+EGFR  CBC with Differential/Platelet    Endocrine   Hypothyroidism    S/p radioactive ablation On Levothyroxine 100 mcg QD Followed by Endocrinology      Relevant Orders   TSH + free T4     Other   Depression, major, single episode, mild (HCC)    On Lexapro 20 mg QD, well-controlled now         Prediabetes   Relevant Orders   Hemoglobin A1c   Other Visit Diagnoses     Need for immunization against influenza       Relevant Orders   Flu Vaccine QUAD 71mo+IM (Fluarix, Fluzone & Alfiuria Quad PF) (Completed)   Vitamin D deficiency       Relevant Orders   VITAMIN D 25 Hydroxy (Vit-D Deficiency, Fractures)       No orders of the defined types were placed in this encounter.   Follow-up: Return in 6 months (on 10/13/2021).    Lindell Spar, MD

## 2021-04-15 NOTE — Assessment & Plan Note (Signed)
On Lexapro 20 mg QD, well-controlled now

## 2021-04-15 NOTE — Assessment & Plan Note (Signed)
Physical exam as mentioned above. Flu vaccine today. PAP smear in 2021

## 2021-04-15 NOTE — Assessment & Plan Note (Signed)
S/p radioactive ablation On Levothyroxine 100 mcg QD Followed by Endocrinology

## 2021-05-08 ENCOUNTER — Other Ambulatory Visit: Payer: Self-pay

## 2021-05-08 ENCOUNTER — Ambulatory Visit (INDEPENDENT_AMBULATORY_CARE_PROVIDER_SITE_OTHER): Payer: Medicaid Other | Admitting: Licensed Clinical Social Worker

## 2021-05-08 DIAGNOSIS — F320 Major depressive disorder, single episode, mild: Secondary | ICD-10-CM

## 2021-06-13 NOTE — BH Specialist Note (Signed)
[facility] Follow Up Assessment  MRN: [id number] NAME: Linda Skinner Date: 06/13/21  Start time: 1230 End time: 1245 Total time: 30  Type of Contact: Follow up Call  Current concerns/stressors: depression   Functional Assessment:  Sleep: poor Appetite: fair Coping ability: overwhelmed Patient taking medications as prescribed:    Current medications:  Outpatient Encounter Medications as of 05/08/2021  Medication Sig   escitalopram (LEXAPRO) 20 MG tablet Take 1 tablet (20 mg total) by mouth daily.   ketoconazole (NIZORAL) 2 % cream Apply 1 application topically 2 (two) times daily. (Patient not taking: No sig reported)   levothyroxine (SYNTHROID) 100 MCG tablet Take 1 tablet (100 mcg total) by mouth daily before breakfast.   Vitamin D, Ergocalciferol, (DRISDOL) 1.25 MG (50000 UNIT) CAPS capsule Take 1 capsule (50,000 Units total) by mouth every 7 (seven) days.   No facility-administered encounter medications on file as of 05/08/2021.    Self-harm and/or Suicidal Behaviors Risk Assessment Self-harm risk factors: no Patient endorses recent self injurious thoughts and/or behaviors: No   Suicide ideations: No plan to harm self or others   Danger to Others Risk Assessment Danger to others risk factors: no Patient endorses recent thoughts of harming others: No    Substance Use Assessment Patient recently consumed alcohol: No  Patient recently used drugs: No  Patient is concerned about dependence or abuse of substances: No    Goals, Interventions and Follow-up Plan Goals:  biweekly VBH sessions Interventions: Mindfulness or Relaxation Training, Behavioral Activation, and CBT Cognitive Behavioral Therapy   Summary of Clinical Assessment  Linda Skinner is a 31 yr old woman that was present for her session.  She reports that she has been doing well at work and has family support with her children.  She currently has a boyfriend and is unsure about the relationship.  She  continues to have depressive symptoms lack of motivation, poor energy, poor sleep habits. Provided support for Patient as she discussed her current mood. Stressed the importance of mood stabilization through learned coping skills and medication management.  Encouraged Patient to focus on her strengths and the positive aspects.    Follow-up Plan:  biweekly VBH sessions Marinda Elk, LCSW

## 2021-06-25 LAB — TSH: TSH: <0.005 u[IU]/mL — ABNORMAL LOW (ref 0.450–4.500)

## 2021-06-25 LAB — T4, FREE: Free T4: 1.91 ng/dL — ABNORMAL HIGH (ref 0.82–1.77)

## 2021-07-01 ENCOUNTER — Encounter: Payer: Self-pay | Admitting: "Endocrinology

## 2021-07-01 ENCOUNTER — Ambulatory Visit (INDEPENDENT_AMBULATORY_CARE_PROVIDER_SITE_OTHER): Payer: Medicaid Other | Admitting: "Endocrinology

## 2021-07-01 ENCOUNTER — Other Ambulatory Visit: Payer: Self-pay

## 2021-07-01 VITALS — BP 104/76 | HR 72 | Ht 64.0 in | Wt 283.8 lb

## 2021-07-01 DIAGNOSIS — E890 Postprocedural hypothyroidism: Secondary | ICD-10-CM | POA: Diagnosis not present

## 2021-07-01 DIAGNOSIS — E0500 Thyrotoxicosis with diffuse goiter without thyrotoxic crisis or storm: Secondary | ICD-10-CM | POA: Diagnosis not present

## 2021-07-01 MED ORDER — LEVOTHYROXINE SODIUM 75 MCG PO TABS: 75.0000 ug | ORAL_TABLET | Freq: Every day | ORAL | 2 refills | Status: DC

## 2021-07-01 NOTE — Progress Notes (Signed)
07/01/2021    Endocrinology follow-up note  Subjective:    Patient ID: Linda Skinner, female    DOB: July 25, 1990, PCP Anabel Halon, MD.   Past Medical History:  Diagnosis Date   Encounter for tubal ligation 09/16/2017   Gestational diabetes    metformin   Graves disease    Maternal varicella, non-immune 02/18/2017   Immunize p delivery   Medical history non-contributory    Supervision of high risk pregnancy, antepartum 02/17/2017    [facility] [place] Initiated Care at  [facility] [place] Dating By LMP c/w 1st trimester U/S 7wk Pap 05/23/15 neg [facility]CHD (got records) GC/CT Initial:   -/-             36+wks:-/- Genetic Screen NT/IT: neg CF screen declined Anatomic [place] Normal female Flu vaccine 02/17/17  Tdap Recommended ~ 28wks Glucose Screen  2 hr   99/122/114  GDM GBS pos Feed Preference both Contraception BTL, consent 02/06/1   SVD (spontaneous vaginal delivery) 09/15/2017    Past Surgical History:  Procedure Laterality Date   MOUTH SURGERY     TUBAL LIGATION N/A 09/15/2017   Procedure: POST PARTUM TUBAL LIGATION;  Surgeon: Kathrynn Running, MD;  Location: [facility];  Service: Gynecology;  Laterality: N/A;   WISDOM TOOTH EXTRACTION      Social History   Socioeconomic History   Marital status: Single    Spouse name: Not on file   Number of children: Not on file   Years of education: Not on file   Highest education level: Not on file  Occupational History   Not on file  Tobacco Use   Smoking status: Former    Packs/day: 0.01    Years: 4.00    Pack years: 0.04    Types: Cigars, Cigarettes   Smokeless tobacco: Never  Vaping Use   Vaping Use: Never used  Substance and Sexual Activity   Alcohol use: No   Drug use: No   Sexual activity: Not Currently    Birth control/protection: Surgical    Comment: tubal  Other Topics Concern   Not on file  Social History Narrative   Not on file   Social Determinants of Health   Financial Resource Strain: Not  on file  Food Insecurity: Not on file  Transportation Needs: Not on file  Physical Activity: Not on file  Stress: Not on file  Social Connections: Not on file    Family History  Problem Relation Age of Onset   Lupus Mother    Cancer Paternal Grandfather    Atrial fibrillation Maternal Grandmother     Outpatient Encounter Medications as of 07/01/2021  Medication Sig   escitalopram (LEXAPRO) 20 MG tablet Take 1 tablet (20 mg total) by mouth daily.   levothyroxine (SYNTHROID) 75 MCG tablet Take 1 tablet (75 mcg total) by mouth daily before breakfast.   Vitamin D, Ergocalciferol, (DRISDOL) 1.25 MG (50000 UNIT) CAPS capsule Take 1 capsule (50,000 Units total) by mouth every 7 (seven) days.   [DISCONTINUED] ketoconazole (NIZORAL) 2 % cream Apply 1 application topically 2 (two) times daily. (Patient not taking: No sig reported)   [DISCONTINUED] levothyroxine (SYNTHROID) 100 MCG tablet Take 1 tablet (100 mcg total) by mouth daily before breakfast.   No facility-administered encounter medications on file as of 07/01/2021.    ALLERGIES: No Known Allergies  VACCINATION STATUS: Immunization History  Administered Date(s) Administered   Influenza,inj,Quad PF,6+ Mos 02/17/2017, 03/01/2020, 04/15/2021   PFIZER(Purple Top)SARS-COV-2 Vaccination  02/07/2020, 03/01/2020   PPD Test 03/11/2020, 03/25/2020   Tdap 09/16/2017     HPI  Linda KannerStacey Lavery is 31 y.o. female who presents today with her new labs and uptake and scan of her thyroid.    she was recently seen in consultation for hyperthyroidism requested by Anabel HalonPatel, Rutwik K, MD. 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  She is currently on 100 mcg of levothyroxine p.o. daily.    She reports compliance with medication.  She has no new  complaints today.  She presents with some weight loss, previsit thyroid function test consistent with slight over replacement.    She denies palpitations, tremor, nor heat intolerance at this time.  she denies family history of thyroid dysfunction. -She denies family hx of thyroid cancer. she denies personal history of goiter.she  is willing to proceed with appropriate work up and therapy for thyrotoxicosis.                           Review of systems  Constitutional: + Weight gain, + fatigue.      Objective:    BP 104/76    Pulse 72    Ht 5\' 4"  (1.626 m)    Wt 283 lb 12.8 oz (128.7 kg)    BMI 48.71 kg/m   Wt Readings from Last 3 Encounters:  07/01/21 283 lb 12.8 oz (128.7 kg)  04/15/21 289 lb 1.9 oz (131.1 kg)  02/27/21 (!) 303 lb 12.8 oz (137.8 kg)                                                Physical exam  Constitutional: Body mass index is 48.71 kg/m., not in acute distress, + normal state of mind   CMP     Component Value Date/Time   NA 140 01/13/2021 1414   K 4.4 01/13/2021 1414   CL 104 01/13/2021 1414   CO2 21 01/13/2021 1414   GLUCOSE 96 01/13/2021 1414   GLUCOSE 108 (H) 05/05/2018 0750   BUN 7 01/13/2021 1414   CREATININE 0.72 01/13/2021 1414   CALCIUM 9.6 01/13/2021 1414   PROT 7.3 01/13/2021 1414   ALBUMIN 4.3 01/13/2021 1414   AST 19 01/13/2021 1414   ALT 11 01/13/2021 1414   ALKPHOS 61 01/13/2021 1414   BILITOT <0.2 01/13/2021 1414   GFRNONAA 118 03/08/2020 0919   GFRAA 136 03/08/2020 0919     CBC    Component Value Date/Time   WBC 7.2 03/08/2020 0919   WBC 10.3 05/05/2018 0750   RBC 4.54 03/08/2020 0919   RBC 4.48 05/05/2018 0750   HGB 12.0 03/08/2020 0919   HCT 36.4 03/08/2020 0919   PLT 264 03/08/2020 0919   MCV 80 03/08/2020 0919   MCH 26.4 (L) 03/08/2020 0919   MCH 25.4 (L) 05/05/2018 0750   MCHC 33.0 03/08/2020 0919   MCHC 31.8 05/05/2018 0750   RDW 13.2 03/08/2020 0919   LYMPHSABS 3.8 (H) 03/08/2020 0919   MONOABS 0.9  05/05/2018 0750   EOSABS 0.2 03/08/2020 0919   BASOSABS 0.0 03/08/2020 0919    Diabetic Labs (most recent): Lab Results  Component Value Date   HGBA1C  5.8 (H) 03/08/2020    Lipid Panel     Component Value Date/Time   CHOL 186 03/08/2020 0919   TRIG 158 (H) 03/08/2020 0919   HDL 53 03/08/2020 0919   CHOLHDL 3.5 03/08/2020 0919   LDLCALC 105 (H) 03/08/2020 0919   LABVLDL 28 03/08/2020 0919     Lab Results  Component Value Date   TSH <0.005 (L) 06/24/2021   TSH 1.660 02/21/2021   TSH 11.900 (H) 01/13/2021   TSH <0.005 (L) 03/08/2020   TSH <0.005 (L) 03/08/2020   FREET4 1.91 (H) 06/24/2021   FREET4 1.16 02/21/2021   FREET4 0.74 (L) 01/13/2021   FREET4 1.41 03/08/2020    Thyroid uptake and scan on April 10, 2020 FINDINGS: Homogeneous tracer distribution in both thyroid lobes.  No focal areas of increased or decreased tracer localization.  4 hour I-123 uptake = 23.7% (normal 5-20%)  ,  24 hour I-123 uptake = 46.9% (normal 10-30%)   IMPRESSION: Normal thyroid scan.  Elevated 4 hour and 24 hour radio iodine uptakes.  Findings consistent with Graves disease.   Assessment & Plan:   1.  RAI induced hypothyroidism 2.  Graves' disease-resolved Her previsit thyroid function tests are consistent with slight over replacement.  I discussed and lowered her levothyroxine to 75 mcg p.o. daily before breakfast.   - We discussed about the correct intake of her thyroid hormone, on empty stomach at fasting, with water, separated by at least 30 minutes from breakfast and other medications,  and separated by more than 4 hours from calcium, iron, multivitamins, acid reflux medications (PPIs). -Patient is made aware of the fact that thyroid hormone replacement is needed for life, dose to be adjusted by periodic monitoring of thyroid function tests.  She will need continued follow-up to make sure she is not failing treatment.    -Patient is advised to maintain close follow up with  Anabel Halon, MD for primary care needs.   I spent 20 minutes in the care of the patient today including review of labs from Thyroid Function, CMP, and other relevant labs ; imaging/biopsy records (current and previous including abstractions from other facilities); face-to-face time discussing  her lab results and symptoms, medications doses, her options of short and long term treatment based on the latest standards of care / guidelines;   and documenting the encounter.  Linda Skinner  participated in the discussions, expressed understanding, and voiced agreement with the above plans.  All questions were answered to her satisfaction. she is encouraged to contact clinic should she have any questions or concerns prior to her return visit.    Follow up plan: Return in about 3 months (around 09/28/2021) for F/U with Pre-visit Labs.   Thank you for involving me in the care of this pleasant patient, and I will continue to update you with her progress.  Marquis Lunch, MD [facility] Phone: [phone]  Fax: [phone]   07/01/2021, 9:47 AM  This note was partially dictated with voice recognition software. Similar sounding words can be transcribed inadequately or may not  be corrected upon review.

## 2021-07-10 ENCOUNTER — Encounter: Payer: Medicaid Other | Admitting: Licensed Clinical Social Worker

## 2021-07-10 ENCOUNTER — Other Ambulatory Visit: Payer: Self-pay

## 2021-07-11 ENCOUNTER — Other Ambulatory Visit: Payer: Self-pay | Admitting: Internal Medicine

## 2021-07-11 DIAGNOSIS — F320 Major depressive disorder, single episode, mild: Secondary | ICD-10-CM

## 2021-07-17 ENCOUNTER — Ambulatory Visit (INDEPENDENT_AMBULATORY_CARE_PROVIDER_SITE_OTHER): Payer: Medicaid Other | Admitting: Licensed Clinical Social Worker

## 2021-07-17 DIAGNOSIS — F320 Major depressive disorder, single episode, mild: Secondary | ICD-10-CM

## 2021-07-23 NOTE — BH Specialist Note (Signed)
Rescheduled to Feb 23 at noon

## 2021-08-27 ENCOUNTER — Other Ambulatory Visit: Payer: Self-pay | Admitting: "Endocrinology

## 2021-08-27 DIAGNOSIS — E0500 Thyrotoxicosis with diffuse goiter without thyrotoxic crisis or storm: Secondary | ICD-10-CM

## 2021-09-08 ENCOUNTER — Other Ambulatory Visit: Payer: Self-pay | Admitting: Internal Medicine

## 2021-09-08 DIAGNOSIS — E559 Vitamin D deficiency, unspecified: Secondary | ICD-10-CM

## 2021-09-23 LAB — TSH: TSH: 0.047 u[IU]/mL — ABNORMAL LOW (ref 0.450–4.500)

## 2021-09-23 LAB — T3, FREE: T3, Free: 3.2 pg/mL (ref 2.0–4.4)

## 2021-09-23 LAB — T4, FREE: Free T4: 1.44 ng/dL (ref 0.82–1.77)

## 2021-09-29 ENCOUNTER — Encounter: Payer: Self-pay | Admitting: "Endocrinology

## 2021-09-29 ENCOUNTER — Ambulatory Visit (INDEPENDENT_AMBULATORY_CARE_PROVIDER_SITE_OTHER): Payer: Medicaid Other | Admitting: "Endocrinology

## 2021-09-29 VITALS — BP 122/72 | HR 72 | Ht 64.0 in | Wt 293.2 lb

## 2021-09-29 DIAGNOSIS — E890 Postprocedural hypothyroidism: Secondary | ICD-10-CM | POA: Diagnosis not present

## 2021-09-29 DIAGNOSIS — E782 Mixed hyperlipidemia: Secondary | ICD-10-CM | POA: Diagnosis not present

## 2021-09-29 DIAGNOSIS — R7303 Prediabetes: Secondary | ICD-10-CM | POA: Diagnosis not present

## 2021-09-29 NOTE — Patient Instructions (Signed)

## 2021-09-29 NOTE — Progress Notes (Signed)
? ? ? 09/29/2021   ? ?Endocrinology follow-up note ? ?Subjective:  ? ? Patient ID: Linda Skinner, female    DOB: 1990-08-25, PCP Lindell Spar, MD. ? ? ?Past Medical History:  ?Diagnosis Date  ? Encounter for tubal ligation 09/16/2017  ? Gestational diabetes   ? metformin  ? Graves disease   ? Maternal varicella, non-immune 02/18/2017  ? Immunize p delivery  ? Medical history non-contributory   ? Supervision of high risk pregnancy, antepartum 02/17/2017  ?  [place] Initiated Care at  [facility] FOB [place] Dating By LMP c/w 1st trimester U/S 7wk Pap 05/23/15 neg [facility] (got records) GC/CT Initial:   -/-             36+wks:-/- Genetic Screen NT/IT: neg CF screen declined Anatomic [place] Normal female Flu vaccine 02/17/17  Tdap Recommended ~ 28wks Glucose Screen  2 hr   99/122/114  GDM GBS pos Feed Preference both Contraception BTL, consent 02/06/1  ? SVD (spontaneous vaginal delivery) 09/15/2017  ? ? ?Past Surgical History:  ?Procedure Laterality Date  ? MOUTH SURGERY    ? TUBAL LIGATION N/A 09/15/2017  ? Procedure: POST PARTUM TUBAL LIGATION;  Surgeon: Gwynne Edinger, MD;  Location: [place];  Service: Gynecology;  Laterality: N/A;  ? WISDOM TOOTH EXTRACTION    ? ? ?Social History  ? ?Socioeconomic History  ? Marital status: Single  ?  Spouse name: Not on file  ? Number of children: Not on file  ? Years of education: Not on file  ? Highest education level: Not on file  ?Occupational History  ? Not on file  ?Tobacco Use  ? Smoking status: Former  ?  Packs/day: 0.01  ?  Years: 4.00  ?  Pack years: 0.04  ?  Types: Cigars, Cigarettes  ? Smokeless tobacco: Never  ?Vaping Use  ? Vaping Use: Never used  ?Substance and Sexual Activity  ? Alcohol use: No  ? Drug use: No  ? Sexual activity: Not Currently  ?  Birth control/protection: Surgical  ?  Comment: tubal  ?Other Topics Concern  ? Not on file  ?Social History Narrative  ? Not on file  ? ?Social Determinants of Health  ? ?Financial Resource Strain: Not on  file  ?Food Insecurity: Not on file  ?Transportation Needs: Not on file  ?Physical Activity: Not on file  ?Stress: Not on file  ?Social Connections: Not on file  ? ? ?Family History  ?Problem Relation Age of Onset  ? Lupus Mother   ? Cancer Paternal Grandfather   ? Atrial fibrillation Maternal Grandmother   ? ? ?Outpatient Encounter Medications as of 09/29/2021  ?Medication Sig  ? Lactobacillus (PROBIOTIC ACIDOPHILUS) TABS Take 1 tablet by mouth daily.  ? escitalopram (LEXAPRO) 20 MG tablet TAKE 1 TABLET BY MOUTH DAILY.  ? levothyroxine (SYNTHROID) 75 MCG tablet TAKE 1 TABLET DAILY BEFORE BREAKFAST.  ? Vitamin D, Ergocalciferol, (DRISDOL) 1.25 MG (50000 UNIT) CAPS capsule TAKE 1 CAPSULE BY MOUTH EVERY 7 DAYS.  ? ?No facility-administered encounter medications on file as of 09/29/2021.  ? ? ?ALLERGIES: ?No Known Allergies ? ?VACCINATION STATUS: ?Immunization History  ?Administered Date(s) Administered  ? Influenza,inj,Quad PF,6+ Mos 02/17/2017, 03/01/2020, 04/15/2021  ? PFIZER(Purple Top)SARS-COV-2 Vaccination 02/07/2020, 03/01/2020  ? PPD Test 03/11/2020, 03/25/2020  ? Tdap 09/16/2017  ? ? ? ?HPI ? ?Linda Skinner is 31 y.o. female who presents today with her new labs and uptake and scan of her thyroid.   ?  she was recently seen in consultation for hyperthyroidism requested by Lindell Spar, MD. ?After appropriate work-up confirmed primary hyperthyroidism from Graves' disease, she was treated with I-131 thyroid ablation in November 2021.  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  She is currently on levothyroxine 75 mcg p.o. daily before breakfast.  ? ? She reports compliance with medication.  She has no new complaints today, except she is unable to lose weight.  She presents with some weight loss, previsit thyroid function test consistent with slight over replacement.   ? ?She denies palpitations, tremor, nor heat intolerance at this time. ? ?she  denies family history of thyroid dysfunction. ?-She denies family hx of thyroid cancer. she denies personal history of goiter.she  is willing to proceed with appropriate work up and therapy for thyrotoxicosis. ? ? ?                        Review of systems ? ?Constitutional: + Weight gain, + fatigue.   ? ? ? ?Objective:  ?  ?BP 122/72   Pulse 72   Ht 5\' 4"  (1.626 m)   Wt 293 lb 3.2 oz (133 kg)   BMI 50.33 kg/m?   ?Wt Readings from Last 3 Encounters:  ?09/29/21 293 lb 3.2 oz (133 kg)  ?07/01/21 283 lb 12.8 oz (128.7 kg)  ?04/15/21 289 lb 1.9 oz (131.1 kg)  ?  ?                     ?                       Physical exam ? ?Constitutional: Body mass index is 50.33 kg/m?., not in acute distress, + normal state of mind ? ? ?CMP  ?   ?Component Value Date/Time  ? NA 140 01/13/2021 1414  ? K 4.4 01/13/2021 1414  ? CL 104 01/13/2021 1414  ? CO2 21 01/13/2021 1414  ? GLUCOSE 96 01/13/2021 1414  ? GLUCOSE 108 (H) 05/05/2018 0750  ? BUN 7 01/13/2021 1414  ? CREATININE 0.72 01/13/2021 1414  ? CALCIUM 9.6 01/13/2021 1414  ? PROT 7.3 01/13/2021 1414  ? ALBUMIN 4.3 01/13/2021 1414  ? AST 19 01/13/2021 1414  ? ALT 11 01/13/2021 1414  ? ALKPHOS 61 01/13/2021 1414  ? BILITOT <0.2 01/13/2021 1414  ? GFRNONAA 118 03/08/2020 0919  ? GFRAA 136 03/08/2020 0919  ? ? ? ?CBC ?   ?Component Value Date/Time  ? WBC 7.2 03/08/2020 0919  ? WBC 10.3 05/05/2018 0750  ? RBC 4.54 03/08/2020 0919  ? RBC 4.48 05/05/2018 0750  ? HGB 12.0 03/08/2020 0919  ? HCT 36.4 03/08/2020 0919  ? PLT 264 03/08/2020 0919  ? MCV 80 03/08/2020 0919  ? MCH 26.4 (L) 03/08/2020 0919  ? MCH 25.4 (L) 05/05/2018 0750  ? MCHC 33.0 03/08/2020 0919  ? MCHC 31.8 05/05/2018 0750  ? RDW 13.2 03/08/2020 0919  ? LYMPHSABS 3.8 (H) 03/08/2020 0919  ? MONOABS 0.9 05/05/2018 0750  ? EOSABS 0.2 03/08/2020 0919  ? BASOSABS 0.0 03/08/2020 0919  ? ? ?Diabetic Labs (most recent): ?Lab Results  ?Component Value Date  ? HGBA1C 5.8 (H) 03/08/2020  ? ? ?Lipid Panel  ?   ?Component Value  Date/Time  ? CHOL 186 03/08/2020 0919  ? TRIG 158 (H) 03/08/2020 0919  ? HDL 53 03/08/2020 0919  ? CHOLHDL 3.5  03/08/2020 0919  ? [place] 105 (H) 03/08/2020 0919  ? LABVLDL 28 03/08/2020 0919  ? ? ? ?Lab Results  ?Component Value Date  ? TSH 0.047 (L) 09/22/2021  ? TSH <0.005 (L) 06/24/2021  ? TSH 1.660 02/21/2021  ? TSH 11.900 (H) 01/13/2021  ? TSH <0.005 (L) 03/08/2020  ? TSH <0.005 (L) 03/08/2020  ? FREET4 1.44 09/22/2021  ? FREET4 1.91 (H) 06/24/2021  ? FREET4 1.16 02/21/2021  ? FREET4 0.74 (L) 01/13/2021  ? FREET4 1.41 03/08/2020  ?  ?Thyroid uptake and scan on April 10, 2020 ?FINDINGS: ?Homogeneous tracer distribution in both thyroid lobes.  ?No focal areas of increased or decreased tracer localization.  ?4 hour I-123 uptake = 23.7% (normal 5-20%)  ,  24 hour I-123 uptake = 46.9% (normal 10-30%) ?  ?IMPRESSION: ?Normal thyroid scan.  ?Elevated 4 hour and 24 hour radio iodine uptakes.  ?Findings consistent with Graves disease. ? ? ?Assessment & Plan:  ? ?1.  RAI induced hypothyroidism ?2.  Graves' disease-resolved ?Her previsit thyroid function tests are such that her current dose of levothyroxine 75 mcg is appropriate for now.   ?She is advised to continue the same dose. ? - We discussed about the correct intake of her thyroid hormone, on empty stomach at fasting, with water, separated by at least 30 minutes from breakfast and other medications,  and separated by more than 4 hours from calcium, iron, multivitamins, acid reflux medications (PPIs). ?-Patient is made aware of the fact that thyroid hormone replacement is needed for life, dose to be adjusted by periodic monitoring of thyroid function tests. ?In light of the fact that she has prediabetes, hyperlipidemia, morbid obesity, she has apparently candidate for lifestyle medicine. ? ?- she acknowledges that there is a room for improvement in her food and drink choices. ?- Suggestion is made for her to avoid simple carbohydrates  from her diet including  Cakes, Sweet Desserts, Ice Cream, Soda (diet and regular), Sweet Tea, Candies, Chips, Cookies, Store Bought Juices, Alcohol , Artificial Sweeteners,  Coffee Creamer, and "Sugar-free" Products, Lemonade. This wi

## 2021-10-09 LAB — CBC WITH DIFFERENTIAL/PLATELET
Basophils Absolute: 0 10*3/uL (ref 0.0–0.2)
Basos: 1 %
EOS (ABSOLUTE): 0.1 10*3/uL (ref 0.0–0.4)
Eos: 2 %
Hematocrit: 32.2 % — ABNORMAL LOW (ref 34.0–46.6)
Hemoglobin: 10 g/dL — ABNORMAL LOW (ref 11.1–15.9)
Immature Grans (Abs): 0 10*3/uL (ref 0.0–0.1)
Immature Granulocytes: 0 %
Lymphocytes Absolute: 2.4 10*3/uL (ref 0.7–3.1)
Lymphs: 48 %
MCH: 24 pg — ABNORMAL LOW (ref 26.6–33.0)
MCHC: 31.1 g/dL — ABNORMAL LOW (ref 31.5–35.7)
MCV: 77 fL — ABNORMAL LOW (ref 79–97)
Monocytes Absolute: 0.4 10*3/uL (ref 0.1–0.9)
Monocytes: 8 %
Neutrophils Absolute: 2.1 10*3/uL (ref 1.4–7.0)
Neutrophils: 41 %
Platelets: 294 10*3/uL (ref 150–450)
RBC: 4.16 x10E6/uL (ref 3.77–5.28)
RDW: 15.6 % — ABNORMAL HIGH (ref 11.7–15.4)
WBC: 5.1 10*3/uL (ref 3.4–10.8)

## 2021-10-09 LAB — CMP14+EGFR
ALT: 17 IU/L (ref 0–32)
AST: 21 IU/L (ref 0–40)
Albumin/Globulin Ratio: 1.5 (ref 1.2–2.2)
Albumin: 4.4 g/dL (ref 3.8–4.8)
Alkaline Phosphatase: 55 IU/L (ref 44–121)
BUN/Creatinine Ratio: 11 (ref 9–23)
BUN: 8 mg/dL (ref 6–20)
Bilirubin Total: 0.3 mg/dL (ref 0.0–1.2)
CO2: 21 mmol/L (ref 20–29)
Calcium: 9 mg/dL (ref 8.7–10.2)
Chloride: 104 mmol/L (ref 96–106)
Creatinine, Ser: 0.71 mg/dL (ref 0.57–1.00)
Globulin, Total: 3 g/dL (ref 1.5–4.5)
Glucose: 98 mg/dL (ref 70–99)
Potassium: 4.3 mmol/L (ref 3.5–5.2)
Sodium: 137 mmol/L (ref 134–144)
Total Protein: 7.4 g/dL (ref 6.0–8.5)
eGFR: 117 mL/min/{1.73_m2} (ref >59)

## 2021-10-09 LAB — LIPID PANEL
Chol/HDL Ratio: 3 ratio (ref 0.0–4.4)
Cholesterol, Total: 151 mg/dL (ref 100–199)
HDL: 51 mg/dL (ref >39)
LDL Chol Calc (NIH): 84 mg/dL (ref 0–99)
Triglycerides: 81 mg/dL (ref 0–149)
VLDL Cholesterol Cal: 16 mg/dL (ref 5–40)

## 2021-10-09 LAB — VITAMIN D 25 HYDROXY (VIT D DEFICIENCY, FRACTURES): Vit D, 25-Hydroxy: 39.8 ng/mL (ref 30.0–100.0)

## 2021-10-09 LAB — TSH+FREE T4
Free T4: 1.55 ng/dL (ref 0.82–1.77)
TSH: 0.022 u[IU]/mL — ABNORMAL LOW (ref 0.450–4.500)

## 2021-10-09 LAB — HEMOGLOBIN A1C
Est. average glucose Bld gHb Est-mCnc: 123 mg/dL
Hgb A1c MFr Bld: 5.9 % — ABNORMAL HIGH (ref 4.8–5.6)

## 2021-10-12 ENCOUNTER — Encounter: Payer: Self-pay | Admitting: Emergency Medicine

## 2021-10-12 ENCOUNTER — Ambulatory Visit
Admission: EM | Admit: 2021-10-12 | Discharge: 2021-10-12 | Disposition: A | Discharge status: Home / Self Care | Payer: Medicaid Other | Attending: Internal Medicine | Admitting: Internal Medicine

## 2021-10-12 DIAGNOSIS — Z202 Contact with and (suspected) exposure to infections with a predominantly sexual mode of transmission: Secondary | ICD-10-CM | POA: Diagnosis not present

## 2021-10-12 DIAGNOSIS — Z113 Encounter for screening for infections with a predominantly sexual mode of transmission: Secondary | ICD-10-CM | POA: Diagnosis present

## 2021-10-12 NOTE — ED Provider Notes (Signed)
?[facility] ? ? ? ?CSN: [id number] ?Arrival date & time: 10/12/21  1117 ? ? ?  ? ?History   ?Chief Complaint ?No chief complaint on file. ? ? ?HPI ?Alysse Rathe is a 31 y.o. female comes to the urgent care for STD screening.  Patient has a new sexual partner.  No symptoms.  ? ?HPI ? ?Past Medical History:  ?Diagnosis Date  ? Encounter for tubal ligation 09/16/2017  ? Gestational diabetes   ? metformin  ? Graves disease   ? Maternal varicella, non-immune 02/18/2017  ? Immunize p delivery  ? Medical history non-contributory   ? Supervision of high risk pregnancy, antepartum 02/17/2017  ?  [facility] [place] Initiated Care at  [facility] FOB [place] Dating By LMP c/w 1st trimester U/S 7wk Pap 05/23/15 neg [facility] (got records) GC/CT Initial:   -/-             36+wks:-/- Genetic Screen NT/IT: neg CF screen declined Anatomic [place] Normal female Flu vaccine 02/17/17  Tdap Recommended ~ 28wks Glucose Screen  2 hr   99/122/114  GDM GBS pos Feed Preference both Contraception BTL, consent 02/06/1  ? SVD (spontaneous vaginal delivery) 09/15/2017  ? ? ?Patient Active Problem List  ? Diagnosis Date Noted  ? Hypothyroidism following radioiodine therapy 09/29/2021  ? Mixed hyperlipidemia 09/29/2021  ? Hypothyroidism 04/15/2021  ? Encounter for general adult medical examination with abnormal findings 04/15/2021  ? Prediabetes 04/15/2021  ? Tinea versicolor 01/13/2021  ? Graves disease 04/12/2020  ? Depression, major, single episode, mild (HCC) 03/08/2020  ? Morbid obesity (HCC) 03/08/2020  ? Gestational diabetes mellitus, class A2 06/17/2017  ? Ovarian cyst 05/26/2017  ? ? ?Past Surgical History:  ?Procedure Laterality Date  ? MOUTH SURGERY    ? TUBAL LIGATION N/A 09/15/2017  ? Procedure: POST PARTUM TUBAL LIGATION;  Surgeon: Kathrynn Running, MD;  Location: [facility];  Service: Gynecology;  Laterality: N/A;  ? WISDOM TOOTH EXTRACTION    ? ? ?OB History   ? ? Gravida  ?6  ? Para  ?4  ? Term  ?4  ? Preterm  ?   ? AB  ?2  ?  Living  ?4  ?  ? ? SAB  ?   ? IAB  ?   ? Ectopic  ?   ? Multiple  ?0  ? Live Births  ?4  ?   ?  ?  ? ? ? ?Home Medications   ? ?Prior to Admission medications   ?Medication Sig Start Date End Date Taking? Authorizing Provider  ?escitalopram (LEXAPRO) 20 MG tablet TAKE 1 TABLET BY MOUTH DAILY. 07/11/21   Anabel Halon, MD  ?Lactobacillus (PROBIOTIC ACIDOPHILUS) TABS Take 1 tablet by mouth daily.    [provider]  ?levothyroxine (SYNTHROID) 75 MCG tablet TAKE 1 TABLET DAILY BEFORE BREAKFAST. 08/28/21   Roma Kayser, MD  ?Vitamin D, Ergocalciferol, (DRISDOL) 1.25 MG (50000 UNIT) CAPS capsule TAKE 1 CAPSULE BY MOUTH EVERY 7 DAYS. 09/08/21   Anabel Halon, MD  ? ? ?Family History ?Family History  ?Problem Relation Age of Onset  ? Lupus Mother   ? Cancer Paternal Grandfather   ? Atrial fibrillation Maternal Grandmother   ? ? ?Social History ?Social History  ? ?Tobacco Use  ? Smoking status: Former  ?  Packs/day: 0.01  ?  Years: 4.00  ?  Pack years: 0.04  ?  Types: Cigars, Cigarettes  ? Smokeless tobacco: Never  ?Vaping Use  ?  Vaping Use: Never used  ?Substance Use Topics  ? Alcohol use: No  ? Drug use: No  ? ? ? ?Allergies   ?Patient has no known allergies. ? ? ?Review of Systems ?Review of Systems  ?All other systems reviewed and are negative. ? ? ?Physical Exam ?Triage Vital Signs ?ED Triage Vitals  ?Enc Vitals Group  ?   BP 10/12/21 1224 123/82  ?   Pulse Rate 10/12/21 1224 81  ?   Resp 10/12/21 1224 18  ?   Temp 10/12/21 1224 97.8 ?F (36.6 ?C)  ?   Temp Source 10/12/21 1224 Oral  ?   SpO2 10/12/21 1224 100 %  ?   Weight --   ?   Height --   ?   Head Circumference --   ?   Peak Flow --   ?   Pain Score 10/12/21 1225 0  ?   Pain Loc --   ?   Pain Edu? --   ?   Excl. in GC? --   ? ?No data found. ? ?Updated Vital Signs ?BP 123/82 (BP Location: Right Arm)   Pulse 81   Temp 97.8 ?F (36.6 ?C) (Oral)   Resp 18   LMP 09/14/2021 (Exact Date)   SpO2 100%  ? ?Visual Acuity ?Right Eye Distance:   ?Left  Eye Distance:   ?Bilateral Distance:   ? ?Right Eye Near:   ?Left Eye Near:    ?Bilateral Near:    ? ?Physical Exam ?Vitals and nursing note reviewed.  ?Constitutional:   ?   General: She is not in acute distress. ?   Appearance: She is not ill-appearing.  ?Cardiovascular:  ?   Rate and Rhythm: Normal rate and regular rhythm.  ?   Pulses: Normal pulses.  ?   Heart sounds: Normal heart sounds.  ?Skin: ?   General: Skin is warm.  ?Neurological:  ?   General: No focal deficit present.  ?   Mental Status: She is alert and oriented to person, place, and time.  ? ? ? ?UC Treatments / Results  ?Labs ?(all labs ordered are listed, but only abnormal results are displayed) ?Labs Reviewed  ?HIV ANTIBODY (ROUTINE TESTING W REFLEX)  ?CERVICOVAGINAL ANCILLARY ONLY  ? ? ?EKG ? ? ?Radiology ?No results found. ? ?Procedures ?Procedures (including critical care time) ? ?Medications Ordered in UC ?Medications - No data to display ? ?Initial Impression / Assessment and Plan / UC Course  ?I have reviewed the triage vital signs and the nursing notes. ? ?Pertinent labs & imaging results that were available during my care of the patient were reviewed by me and considered in my medical decision making (see chart for details). ? ?  ? ?1.  Screen for STDs: ?Cervical vaginal swab for GC/chlamydia/trichomonas ?HIV, RPR ?We will call patient with recommendations if labs are abnormal ?Return to urgent care if there are any other concerns. ?Final Clinical Impressions(s) / UC Diagnoses  ? ?Final diagnoses:  ?Possible exposure to STD  ?Screen for STD (sexually transmitted disease)  ? ?Discharge Instructions   ?None ?  ? ?ED Prescriptions   ?None ?  ? ?PDMP not reviewed this encounter. ?  ?Merrilee Jansky, MD ?10/12/21 1304 ? ?

## 2021-10-12 NOTE — Discharge Instructions (Addendum)
We will call you with recommendations if labs are abnormal ?Please abstain from sexual intercourse until the lab results are available. ?

## 2021-10-12 NOTE — ED Triage Notes (Signed)
Wants to get checked for STD. States she has a new partner.  Denies discharge or pain. ?

## 2021-10-13 LAB — CERVICOVAGINAL ANCILLARY ONLY
Bacterial Vaginitis (gardnerella): POSITIVE — AB
Candida Glabrata: NEGATIVE
Candida Vaginitis: NEGATIVE
Chlamydia: NEGATIVE
Comment: NEGATIVE
Comment: NEGATIVE
Comment: NEGATIVE
Comment: NEGATIVE
Comment: NEGATIVE
Comment: NORMAL
Neisseria Gonorrhea: NEGATIVE
Trichomonas: NEGATIVE

## 2021-10-14 ENCOUNTER — Telehealth (HOSPITAL_COMMUNITY): Payer: Self-pay | Admitting: Emergency Medicine

## 2021-10-14 LAB — HIV ANTIBODY (ROUTINE TESTING W REFLEX): HIV Screen 4th Generation wRfx: NONREACTIVE

## 2021-10-14 LAB — RPR: RPR Ser Ql: NONREACTIVE

## 2021-10-14 MED ORDER — METRONIDAZOLE 500 MG PO TABS: 500.0000 mg | ORAL_TABLET | Freq: Two times a day (BID) | ORAL | 0 refills | Status: DC

## 2021-10-15 ENCOUNTER — Encounter: Payer: Self-pay | Admitting: Internal Medicine

## 2021-10-15 ENCOUNTER — Ambulatory Visit: Payer: Medicaid Other | Admitting: Internal Medicine

## 2021-10-15 VITALS — BP 118/88 | HR 79 | Resp 18 | Ht 64.0 in | Wt 292.0 lb

## 2021-10-15 DIAGNOSIS — E890 Postprocedural hypothyroidism: Secondary | ICD-10-CM | POA: Diagnosis not present

## 2021-10-15 DIAGNOSIS — G4733 Obstructive sleep apnea (adult) (pediatric): Secondary | ICD-10-CM | POA: Diagnosis not present

## 2021-10-15 DIAGNOSIS — R7303 Prediabetes: Secondary | ICD-10-CM

## 2021-10-15 DIAGNOSIS — F321 Major depressive disorder, single episode, moderate: Secondary | ICD-10-CM | POA: Diagnosis not present

## 2021-10-15 MED ORDER — BUPROPION HCL ER (XL) 150 MG PO TB24: 150.0000 mg | ORAL_TABLET | Freq: Every day | ORAL | 5 refills | Status: DC

## 2021-10-15 NOTE — Assessment & Plan Note (Signed)
S/p radioactive ablation ?On Levothyroxine 75 mcg QD ?Followed by Endocrinology ?

## 2021-10-15 NOTE — Progress Notes (Signed)
? ?Established Patient Office Visit ? ?Subjective:  ?Patient ID: Linda Skinner, female    DOB: 1991-05-14  Age: 31 y.o. MRN: [id number] ? ?CC:  ?Chief Complaint  ?Patient presents with  ? Follow-up  ?  6 month follow up would like to discuss weight loss   ? ? ?HPI ?Linda Skinner is a 31 y.o. female with past medical history of Grave's disease and major depressive disorder who presents for f/u of her chronic medical conditions. ? ?Hypothyroidism: She had radioactive iodine therapy for Graves' disease in 04/2020.  She is on Levothyroxine 75 mcg QD. ? ?Depression: She has been taking Lexapro for depression.  She still has anhedonia, insomnia and lack of interest in her routine activities.  Denies any SI or HI currently. ? ?She has been struggling to lose weight.  She agrees to strictly follow low-carb diet for now. ? ?She reports hypersomnia, snoring and daytime fatigue.  Denies any apneic episode. ? ? ?Past Medical History:  ?Diagnosis Date  ? Encounter for tubal ligation 09/16/2017  ? Gestational diabetes   ? metformin  ? Graves disease   ? Maternal varicella, non-immune 02/18/2017  ? Immunize p delivery  ? Medical history non-contributory   ? Supervision of high risk pregnancy, antepartum 02/17/2017  ?  [place] Initiated Care at  [facility] FOB [place] Dating By LMP c/w 1st trimester U/S 7wk Pap 05/23/15 neg [facility] (got records) GC/CT Initial:   -/-             36+wks:-/- Genetic Screen NT/IT: neg CF screen declined Anatomic [place] Normal female Flu vaccine 02/17/17  Tdap Recommended ~ 28wks Glucose Screen  2 hr   99/122/114  GDM GBS pos Feed Preference both Contraception BTL, consent 02/06/1  ? SVD (spontaneous vaginal delivery) 09/15/2017  ? ? ?Past Surgical History:  ?Procedure Laterality Date  ? MOUTH SURGERY    ? TUBAL LIGATION N/A 09/15/2017  ? Procedure: POST PARTUM TUBAL LIGATION;  Surgeon: Gwynne Edinger, MD;  Location: [place];  Service: Gynecology;  Laterality: N/A;  ? WISDOM TOOTH EXTRACTION     ? ? ?Family History  ?Problem Relation Age of Onset  ? Lupus Mother   ? Cancer Paternal Grandfather   ? Atrial fibrillation Maternal Grandmother   ? ? ?Social History  ? ?Socioeconomic History  ? Marital status: Single  ?  Spouse name: Not on file  ? Number of children: Not on file  ? Years of education: Not on file  ? Highest education level: Not on file  ?Occupational History  ? Not on file  ?Tobacco Use  ? Smoking status: Former  ?  Packs/day: 0.01  ?  Years: 4.00  ?  Pack years: 0.04  ?  Types: Cigars, Cigarettes  ? Smokeless tobacco: Never  ?Vaping Use  ? Vaping Use: Never used  ?Substance and Sexual Activity  ? Alcohol use: No  ? Drug use: No  ? Sexual activity: Not Currently  ?  Birth control/protection: Surgical  ?  Comment: tubal  ?Other Topics Concern  ? Not on file  ?Social History Narrative  ? Not on file  ? ?Social Determinants of Health  ? ?Financial Resource Strain: Not on file  ?Food Insecurity: Not on file  ?Transportation Needs: Not on file  ?Physical Activity: Not on file  ?Stress: Not on file  ?Social Connections: Not on file  ?Intimate Partner Violence: Not on file  ? ? ?Outpatient Medications Prior to Visit  ?Medication Sig Dispense  Refill  ? escitalopram (LEXAPRO) 20 MG tablet TAKE 1 TABLET BY MOUTH DAILY. 90 tablet 0  ? Lactobacillus (PROBIOTIC ACIDOPHILUS) TABS Take 1 tablet by mouth daily.    ? levothyroxine (SYNTHROID) 75 MCG tablet TAKE 1 TABLET DAILY BEFORE BREAKFAST. 90 tablet 0  ? metroNIDAZOLE (FLAGYL) 500 MG tablet Take 1 tablet (500 mg total) by mouth 2 (two) times daily. 14 tablet 0  ? Vitamin D, Ergocalciferol, (DRISDOL) 1.25 MG (50000 UNIT) CAPS capsule TAKE 1 CAPSULE BY MOUTH EVERY 7 DAYS. 12 capsule 0  ? ?No facility-administered medications prior to visit.  ? ? ?No Known Allergies ? ?ROS ?Review of Systems  ?Constitutional:  Negative for chills and fever.  ?HENT:  Negative for congestion, sinus pressure, sinus pain and sore throat.   ?Eyes:  Negative for pain and discharge.   ?Respiratory:  Negative for cough and shortness of breath.   ?Cardiovascular:  Negative for chest pain and palpitations.  ?Gastrointestinal:  Negative for abdominal pain, constipation, diarrhea, nausea and vomiting.  ?Endocrine: Negative for polydipsia and polyuria.  ?Genitourinary:  Negative for dysuria and hematuria.  ?Musculoskeletal:  Negative for neck pain and neck stiffness.  ?Skin:  Negative for rash.  ?Neurological:  Negative for dizziness and weakness.  ?Psychiatric/Behavioral:  Negative for agitation, sleep disturbance and suicidal ideas.   ? ?  ?Objective:  ?  ?Physical Exam ?Vitals reviewed.  ?Constitutional:   ?   General: She is not in acute distress. ?   Appearance: She is obese. She is not diaphoretic.  ?HENT:  ?   Head: Normocephalic and atraumatic.  ?   Nose: Nose normal.  ?   Mouth/Throat:  ?   Mouth: Mucous membranes are moist.  ?Eyes:  ?   General: No scleral icterus. ?   Extraocular Movements: Extraocular movements intact.  ?Cardiovascular:  ?   Rate and Rhythm: Normal rate and regular rhythm.  ?   Pulses: Normal pulses.  ?   Heart sounds: No murmur heard. ?Pulmonary:  ?   Breath sounds: Normal breath sounds. No wheezing or rales.  ?Abdominal:  ?   Palpations: Abdomen is soft.  ?   Tenderness: There is no abdominal tenderness.  ?Musculoskeletal:  ?   Cervical back: Normal range of motion and neck supple. No rigidity or tenderness.  ?   Right lower leg: No edema.  ?   Left lower leg: No edema.  ?Skin: ?   General: Skin is warm.  ?   Coloration: Skin is not pale.  ?Neurological:  ?   General: No focal deficit present.  ?   Mental Status: She is alert and oriented to person, place, and time.  ?   Cranial Nerves: No cranial nerve deficit.  ?   Sensory: No sensory deficit.  ?   Motor: No weakness.  ?Psychiatric:     ?   Mood and Affect: Mood normal.     ?   Behavior: Behavior normal.  ? ? ?BP 118/88 (BP Location: Left Arm, Patient Position: Sitting, Cuff Size: Normal)   Pulse 79   Resp 18   Ht  _0  (1.626 m)   Wt 292 lb (132.5 kg)   SpO2 98%   BMI 50.12 kg/m?  ?Wt Readings from Last 3 Encounters:  ?10/15/21 292 lb (132.5 kg)  ?09/29/21 293 lb 3.2 oz (133 kg)  ?07/01/21 283 lb 12.8 oz (128.7 kg)  ? ? ?Lab Results  ?Component Value Date  ? TSH 0.022 (L) 10/08/2021  ? ?Lab  Results  ?Component Value Date  ? WBC 5.1 10/08/2021  ? HGB 10.0 (L) 10/08/2021  ? HCT 32.2 (L) 10/08/2021  ? MCV 77 (L) 10/08/2021  ? PLT 294 10/08/2021  ? ?Lab Results  ?Component Value Date  ? NA 137 10/08/2021  ? K 4.3 10/08/2021  ? CO2 21 10/08/2021  ? GLUCOSE 98 10/08/2021  ? BUN 8 10/08/2021  ? CREATININE 0.71 10/08/2021  ? BILITOT 0.3 10/08/2021  ? ALKPHOS 55 10/08/2021  ? AST 21 10/08/2021  ? ALT 17 10/08/2021  ? PROT 7.4 10/08/2021  ? ALBUMIN 4.4 10/08/2021  ? CALCIUM 9.0 10/08/2021  ? ANIONGAP 9 05/05/2018  ? EGFR 117 10/08/2021  ? ?Lab Results  ?Component Value Date  ? CHOL 151 10/08/2021  ? ?Lab Results  ?Component Value Date  ? HDL 51 10/08/2021  ? ?Lab Results  ?Component Value Date  ? [place] 84 10/08/2021  ? ?Lab Results  ?Component Value Date  ? TRIG 81 10/08/2021  ? ?Lab Results  ?Component Value Date  ? CHOLHDL 3.0 10/08/2021  ? ?Lab Results  ?Component Value Date  ? HGBA1C 5.9 (H) 10/08/2021  ? ? ?  ?Assessment & Plan:  ? ?Problem List Items Addressed This Visit   ? ?  ? Respiratory  ? OSA (obstructive sleep apnea)  ?  Reports snoring, hypersomnia and daytime fatigue ?Check home sleep study ? ?  ?  ? Relevant Orders  ? Home sleep test  ?  ? Endocrine  ? Hypothyroidism  ?  S/p radioactive ablation ?On Levothyroxine 75 mcg QD ?Followed by Endocrinology ? ?  ?  ?  ? Other  ? Depression, major, single episode, moderate ([place]) - Primary  ?   ?  10/15/2021  ?  8:45 AM 04/15/2021  ?  8:53 AM 04/02/2021  ?  9:01 AM  ?PHQ9 SCORE ONLY  ?PHQ-9 Total Score _0 ?Some symptoms of mood changes would be because of hypothyroidism ?On Lexapro 20 mg QD ?Added Wellbutrin 150 mg QD ?  ?  ? Relevant Medications  ? buPROPion  (WELLBUTRIN XL) 150 MG 24 hr tablet  ? Morbid obesity ([place])  ?  Emphasized on diet modification and moderate exercise ? ?  ?  ? Prediabetes  ?  Lab Results  ?Component Value Date  ? HGBA1C 5.9 (H) 10/08/2021  ?Advise

## 2021-10-15 NOTE — Assessment & Plan Note (Signed)
Emphasized on diet modification and moderate exercise 

## 2021-10-15 NOTE — Patient Instructions (Signed)
Please start taking Wellbutrin in addition to Lexapro. ? ?Please continue taking other medications as prescribed. ? ?Please follow low carb diet and perform moderate exercise/walking at least 150 mins/week. ?

## 2021-10-15 NOTE — Assessment & Plan Note (Signed)
?    10/15/2021  ?  8:45 AM 04/15/2021  ?  8:53 AM 04/02/2021  ?  9:01 AM  ?PHQ9 SCORE ONLY  ?PHQ-9 Total Score 12 10 16   ? ?Some symptoms of mood changes would be because of hypothyroidism ?On Lexapro 20 mg QD ?Added Wellbutrin 150 mg QD ?

## 2021-10-15 NOTE — Assessment & Plan Note (Signed)
Reports snoring, hypersomnia and daytime fatigue ?Check home sleep study ?

## 2021-10-15 NOTE — Assessment & Plan Note (Signed)
Lab Results  ?Component Value Date  ? HGBA1C 5.9 (H) 10/08/2021  ? ?Advised to follow low carb diet ?

## 2021-10-23 ENCOUNTER — Other Ambulatory Visit: Payer: Self-pay | Admitting: "Endocrinology

## 2021-10-23 DIAGNOSIS — E0500 Thyrotoxicosis with diffuse goiter without thyrotoxic crisis or storm: Secondary | ICD-10-CM

## 2021-11-11 ENCOUNTER — Other Ambulatory Visit (INDEPENDENT_AMBULATORY_CARE_PROVIDER_SITE_OTHER): Payer: Medicaid Other | Admitting: *Deleted

## 2021-11-11 ENCOUNTER — Other Ambulatory Visit (HOSPITAL_COMMUNITY)
Admission: RE | Admit: 2021-11-11 | Discharge: 2021-11-11 | Disposition: A | Discharge status: Home / Self Care | Payer: Medicaid Other | Source: Ambulatory Visit | Attending: Obstetrics & Gynecology | Admitting: Obstetrics & Gynecology

## 2021-11-11 DIAGNOSIS — N898 Other specified noninflammatory disorders of vagina: Secondary | ICD-10-CM

## 2021-11-11 NOTE — Progress Notes (Signed)
   NURSE VISIT- VAGINITIS/STD  SUBJECTIVE:  Linda Skinner is a 31 y.o. [id number] GYN patientfemale here for a vaginal swab for vaginitis screening, STD screen.  She reports the following symptoms:  vaginal irritation  for 3-4 days. Denies abnormal vaginal bleeding, significant pelvic pain, fever, or UTI symptoms.  OBJECTIVE:  LMP 09/14/2021 (Exact Date)   Appears well, in no apparent distress  ASSESSMENT: Vaginal swab for vaginitis screening & STD screening.  PLAN: Self-collected vaginal probe for Gonorrhea, Chlamydia, Trichomonas, Bacterial Vaginosis, Yeast sent to lab Treatment: to be determined once results are received Follow-up as needed if symptoms persist/worsen, or new symptoms develop  [name]  11/11/2021 10:41 AM

## 2021-11-12 ENCOUNTER — Encounter: Payer: Self-pay | Admitting: Adult Health

## 2021-11-12 ENCOUNTER — Other Ambulatory Visit: Payer: Self-pay | Admitting: Adult Health

## 2021-11-12 DIAGNOSIS — B9689 Other specified bacterial agents as the cause of diseases classified elsewhere: Secondary | ICD-10-CM

## 2021-11-12 DIAGNOSIS — N760 Acute vaginitis: Secondary | ICD-10-CM | POA: Insufficient documentation

## 2021-11-12 HISTORY — DX: Other specified bacterial agents as the cause of diseases classified elsewhere: B96.89

## 2021-11-12 LAB — CERVICOVAGINAL ANCILLARY ONLY
Bacterial Vaginitis (gardnerella): POSITIVE — AB
Candida Glabrata: POSITIVE — AB
Candida Vaginitis: POSITIVE — AB
Chlamydia: NEGATIVE
Comment: NEGATIVE
Comment: NEGATIVE
Comment: NEGATIVE
Comment: NEGATIVE
Comment: NEGATIVE
Comment: NORMAL
Neisseria Gonorrhea: NEGATIVE
Trichomonas: NEGATIVE

## 2021-11-12 MED ORDER — METRONIDAZOLE 500 MG PO TABS: 500.0000 mg | ORAL_TABLET | Freq: Two times a day (BID) | ORAL | 0 refills | Status: DC

## 2021-11-12 MED ORDER — FLUCONAZOLE 100 MG PO TABS: ORAL_TABLET | ORAL | 0 refills | Status: DC

## 2021-11-27 ENCOUNTER — Other Ambulatory Visit: Payer: Self-pay | Admitting: Internal Medicine

## 2021-11-27 DIAGNOSIS — E559 Vitamin D deficiency, unspecified: Secondary | ICD-10-CM

## 2021-12-03 ENCOUNTER — Ambulatory Visit
Admission: EM | Admit: 2021-12-03 | Discharge: 2021-12-03 | Disposition: A | Discharge status: Home / Self Care | Payer: Medicaid Other | Attending: Family Medicine | Admitting: Family Medicine

## 2021-12-03 ENCOUNTER — Encounter: Payer: Self-pay | Admitting: Emergency Medicine

## 2021-12-03 DIAGNOSIS — R509 Fever, unspecified: Secondary | ICD-10-CM | POA: Insufficient documentation

## 2021-12-03 DIAGNOSIS — J029 Acute pharyngitis, unspecified: Secondary | ICD-10-CM | POA: Insufficient documentation

## 2021-12-03 LAB — POCT RAPID STREP A (OFFICE): Rapid Strep A Screen: NEGATIVE

## 2021-12-03 MED ORDER — AMOXICILLIN 875 MG PO TABS: 875.0000 mg | ORAL_TABLET | Freq: Two times a day (BID) | ORAL | 0 refills | Status: DC

## 2021-12-03 MED ORDER — LIDOCAINE VISCOUS HCL 2 % MT SOLN: 10.0000 mL | OROMUCOSAL | 0 refills | Status: DC | PRN

## 2021-12-03 NOTE — ED Provider Notes (Signed)
[facility]    CSN: [id number] Arrival date & time: 12/03/21  1139      History   Chief Complaint No chief complaint on file.   HPI Linda Skinner is a 31 y.o. female.   Presenting today with 2-day history of sore throat, chills, body aches, difficulty swallowing due to pain.  Thinks she may be developing a cough but has not been coughing as the throat pain has been too severe.  So far taking DayQuil, Tylenol with minimal relief.  No known sick contacts recently.    Past Medical History:  Diagnosis Date   BV (bacterial vaginosis) 11/12/2021   11/12/21 rx flagyl   Encounter for tubal ligation 09/16/2017   Gestational diabetes    metformin   Graves disease    Maternal varicella, non-immune 02/18/2017   Immunize p delivery   Medical history non-contributory    Supervision of high risk pregnancy, antepartum 02/17/2017    [place] Initiated Care at  [place] Dating By LMP c/w 1st trimester U/S 7wk Pap 05/23/15 neg [facility] (got records) GC/CT Initial:   -/-             36+wks:-/- Genetic Screen NT/IT: neg CF screen declined Anatomic [place] Normal female Flu vaccine 02/17/17  Tdap Recommended ~ 28wks Glucose Screen  2 hr   99/122/114  GDM GBS pos Feed Preference both Contraception BTL, consent 02/06/1   SVD (spontaneous vaginal delivery) 09/15/2017    Patient Active Problem List   Diagnosis Date Noted   BV (bacterial vaginosis) 11/12/2021   OSA (obstructive sleep apnea) 10/15/2021   Hypothyroidism following radioiodine therapy 09/29/2021   Mixed hyperlipidemia 09/29/2021   Hypothyroidism 04/15/2021   Encounter for general adult medical examination with abnormal findings 04/15/2021   Prediabetes 04/15/2021   Tinea versicolor 01/13/2021   Graves disease 04/12/2020   Depression, major, single episode, moderate ([place]) 03/08/2020   Morbid obesity ([place]) 03/08/2020   Gestational diabetes mellitus, class A2 06/17/2017   Ovarian cyst 05/26/2017    Past Surgical  History:  Procedure Laterality Date   MOUTH SURGERY     TUBAL LIGATION N/A 09/15/2017   Procedure: POST PARTUM TUBAL LIGATION;  Surgeon: Gwynne Edinger, MD;  Location: [place];  Service: Gynecology;  Laterality: N/A;   WISDOM TOOTH EXTRACTION      OB History     Gravida  6   Para  4   Term  4   Preterm      AB  2   Living  4      SAB      IAB      Ectopic      Multiple  0   Live Births  4           Home Medications    Prior to Admission medications   Medication Sig Start Date End Date Taking? Authorizing Provider  amoxicillin (AMOXIL) 875 MG tablet Take 1 tablet (875 mg total) by mouth 2 (two) times daily. 12/03/21  Yes Volney American, PA-C  lidocaine (XYLOCAINE) 2 % solution Use as directed 10 mLs in the mouth or throat every 3 (three) hours as needed for mouth pain. 12/03/21  Yes Volney American, PA-C  buPROPion (WELLBUTRIN XL) 150 MG 24 hr tablet Take 1 tablet (150 mg total) by mouth daily. 10/15/21   Lindell Spar, MD  fluconazole (DIFLUCAN) 100 MG tablet Take 1 daily for 10 days 11/12/21   Estill Dooms,  NP  levothyroxine (SYNTHROID) 75 MCG tablet TAKE 1 TABLET DAILY BEFORE BREAKFAST. 10/23/21   Roma Kayser, MD  metroNIDAZOLE (FLAGYL) 500 MG tablet Take 1 tablet (500 mg total) by mouth 2 (two) times daily. 11/12/21   Adline Potter, NP  Vitamin D, Ergocalciferol, (DRISDOL) 1.25 MG (50000 UNIT) CAPS capsule TAKE 1 CAPSULE BY MOUTH EVERY 7 DAYS. 11/27/21   Anabel Halon, MD   Family History Family History  Problem Relation Age of Onset   Lupus Mother    Cancer Paternal Grandfather    Atrial fibrillation Maternal Grandmother    Social History Social History   Tobacco Use   Smoking status: Former    Packs/day: 0.01    Years: 4.00    Total pack years: 0.04    Types: Cigars, Cigarettes   Smokeless tobacco: Never  Vaping Use   Vaping Use: Never used  Substance Use Topics   Alcohol use: No   Drug use: No      Allergies   Patient has no known allergies.   Review of Systems Review of Systems PER HPI  Physical Exam Triage Vital Signs ED Triage Vitals  Enc Vitals Group     BP 12/03/21 1226 140/84     Pulse Rate 12/03/21 1226 78     Resp 12/03/21 1226 18     Temp 12/03/21 1226 98.6 F (37 C)     Temp Source 12/03/21 1226 Oral     SpO2 12/03/21 1226 98 %     Weight --      Height --      Head Circumference --      Peak Flow --      Pain Score 12/03/21 1227 8     Pain Loc --      Pain Edu? --      Excl. in GC? --    No data found.  Updated Vital Signs BP 140/84 (BP Location: Right Arm)   Pulse 78   Temp 98.6 F (37 C) (Oral)   Resp 18   LMP 11/18/2021 (Exact Date)   SpO2 98%   Visual Acuity Right Eye Distance:   Left Eye Distance:   Bilateral Distance:    Right Eye Near:   Left Eye Near:    Bilateral Near:     Physical Exam Vitals and nursing note reviewed.  Constitutional:      Appearance: Normal appearance.  HENT:     Head: Atraumatic.     Right Ear: Tympanic membrane and external ear normal.     Left Ear: Tympanic membrane and external ear normal.     Nose: Nose normal.     Mouth/Throat:     Mouth: Mucous membranes are moist.     Pharynx: Posterior oropharyngeal erythema present. No oropharyngeal exudate.     Comments: Moderate tonsillar erythema, edema bilaterally.  Uvula midline, oral airway patent Eyes:     Extraocular Movements: Extraocular movements intact.     Conjunctiva/sclera: Conjunctivae normal.  Cardiovascular:     Rate and Rhythm: Normal rate and regular rhythm.     Heart sounds: Normal heart sounds.  Pulmonary:     Effort: Pulmonary effort is normal.     Breath sounds: Normal breath sounds. No wheezing.  Musculoskeletal:        General: Normal range of motion.     Cervical back: Normal range of motion and neck supple.  Lymphadenopathy:     Cervical: Cervical adenopathy present.  Skin:  General: Skin is warm and dry.   Neurological:     Mental Status: She is alert and oriented to person, place, and time.     Motor: No weakness.     Gait: Gait normal.  Psychiatric:        Mood and Affect: Mood normal.        Thought Content: Thought content normal.    UC Treatments / Results  Labs (all labs ordered are listed, but only abnormal results are displayed) Labs Reviewed  CULTURE, GROUP A STREP ([facility])  NOVEL CORONAVIRUS, NAA  POCT RAPID STREP A (OFFICE)    EKG   Radiology No results found.  Procedures Procedures (including critical care time)  Medications Ordered in UC Medications - No data to display  Initial Impression / Assessment and Plan / UC Course  I have reviewed the triage vital signs and the nursing notes.  Pertinent labs & imaging results that were available during my care of the patient were reviewed by me and considered in my medical decision making (see chart for details).     Vital signs reassuring, rapid strep negative, throat culture and COVID testing pending.  Will cover for bacterial tonsillitis given symptoms and exam findings while awaiting remainder of results.  Discussed supportive care and return precautions additionally.  Final Clinical Impressions(s) / UC Diagnoses   Final diagnoses:  Sore throat  Fever, unspecified   Discharge Instructions   None    ED Prescriptions     Medication Sig Dispense Auth. Provider   amoxicillin (AMOXIL) 875 MG tablet Take 1 tablet (875 mg total) by mouth 2 (two) times daily. 20 tablet Particia Nearing, PA-C   lidocaine (XYLOCAINE) 2 % solution Use as directed 10 mLs in the mouth or throat every 3 (three) hours as needed for mouth pain. 100 mL Particia Nearing, [place]      PDMP not reviewed this encounter.   Particia Nearing, [place] 12/03/21 1649

## 2021-12-03 NOTE — ED Triage Notes (Signed)
Sore throat x 2 days

## 2021-12-04 LAB — NOVEL CORONAVIRUS, NAA: SARS-CoV-2, NAA: NOT DETECTED

## 2021-12-06 LAB — CULTURE, GROUP A STREP (THRC)

## 2021-12-26 LAB — COMPREHENSIVE METABOLIC PANEL
ALT: 13 IU/L (ref 0–32)
AST: 15 IU/L (ref 0–40)
Albumin/Globulin Ratio: 1.4 (ref 1.2–2.2)
Albumin: 4.2 g/dL (ref 3.9–4.9)
Alkaline Phosphatase: 50 IU/L (ref 44–121)
BUN/Creatinine Ratio: 11 (ref 9–23)
BUN: 8 mg/dL (ref 6–20)
Bilirubin Total: 0.2 mg/dL (ref 0.0–1.2)
CO2: 23 mmol/L (ref 20–29)
Calcium: 9.5 mg/dL (ref 8.7–10.2)
Chloride: 103 mmol/L (ref 96–106)
Creatinine, Ser: 0.7 mg/dL (ref 0.57–1.00)
Globulin, Total: 3.1 g/dL (ref 1.5–4.5)
Glucose: 99 mg/dL (ref 70–99)
Potassium: 4.5 mmol/L (ref 3.5–5.2)
Sodium: 138 mmol/L (ref 134–144)
Total Protein: 7.3 g/dL (ref 6.0–8.5)
eGFR: 119 mL/min/{1.73_m2} (ref >59)

## 2021-12-26 LAB — LIPID PANEL
Chol/HDL Ratio: 2.9 ratio (ref 0.0–4.4)
Cholesterol, Total: 159 mg/dL (ref 100–199)
HDL: 55 mg/dL (ref >39)
LDL Chol Calc (NIH): 88 mg/dL (ref 0–99)
Triglycerides: 83 mg/dL (ref 0–149)
VLDL Cholesterol Cal: 16 mg/dL (ref 5–40)

## 2021-12-26 LAB — T4, FREE: Free T4: 1.33 ng/dL (ref 0.82–1.77)

## 2021-12-26 LAB — TSH: TSH: 0.028 u[IU]/mL — ABNORMAL LOW (ref 0.450–4.500)

## 2021-12-30 ENCOUNTER — Encounter: Payer: Self-pay | Admitting: "Endocrinology

## 2021-12-30 ENCOUNTER — Ambulatory Visit: Payer: Medicaid Other | Admitting: "Endocrinology

## 2021-12-30 VITALS — BP 122/78 | HR 72 | Ht 64.0 in | Wt 286.8 lb

## 2021-12-30 DIAGNOSIS — E782 Mixed hyperlipidemia: Secondary | ICD-10-CM

## 2021-12-30 DIAGNOSIS — E0500 Thyrotoxicosis with diffuse goiter without thyrotoxic crisis or storm: Secondary | ICD-10-CM

## 2021-12-30 DIAGNOSIS — R7303 Prediabetes: Secondary | ICD-10-CM | POA: Diagnosis not present

## 2021-12-30 DIAGNOSIS — E890 Postprocedural hypothyroidism: Secondary | ICD-10-CM

## 2021-12-30 MED ORDER — LEVOTHYROXINE SODIUM 50 MCG PO TABS
75.0000 ug | ORAL_TABLET | Freq: Every day | ORAL | 0 refills | Status: DC
Start: 2021-12-30 — End: 2021-12-30

## 2021-12-30 MED ORDER — LEVOTHYROXINE SODIUM 50 MCG PO TABS: 50.0000 ug | ORAL_TABLET | Freq: Every day | ORAL | 0 refills | Status: DC

## 2021-12-30 NOTE — Patient Instructions (Signed)

## 2021-12-30 NOTE — Progress Notes (Signed)
12/30/2021    Endocrinology follow-up note  Subjective:    Patient ID: Linda Skinner, female    DOB: 1990-12-14, PCP Anabel Halon, MD.   Past Medical History:  Diagnosis Date   BV (bacterial vaginosis) 11/12/2021   11/12/21 rx flagyl   Encounter for tubal ligation 09/16/2017   Gestational diabetes    metformin   Graves disease    Maternal varicella, non-immune 02/18/2017   Immunize p delivery   Medical history non-contributory    Supervision of high risk pregnancy, antepartum 02/17/2017    [facility] at  [facility] [place] Dating By LMP c/w 1st trimester U/S 7wk Pap 05/23/15 neg [facility] (got records) GC/CT Initial:   -/-             36+wks:-/- Genetic Screen NT/IT: neg CF screen declined Anatomic [place] Normal female Flu vaccine 02/17/17  Tdap Recommended ~ 28wks Glucose Screen  2 hr   99/122/114  GDM GBS pos Feed Preference both Contraception BTL, consent 02/06/1   SVD (spontaneous vaginal delivery) 09/15/2017    Past Surgical History:  Procedure Laterality Date   MOUTH SURGERY     TUBAL LIGATION N/A 09/15/2017   Procedure: POST PARTUM TUBAL LIGATION;  Surgeon: Kathrynn Running, MD;  Location: [facility];  Service: Gynecology;  Laterality: N/A;   WISDOM TOOTH EXTRACTION      Social History   Socioeconomic History   Marital status: Single    Spouse name: Not on file   Number of children: Not on file   Years of education: Not on file   Highest education level: Not on file  Occupational History   Not on file  Tobacco Use   Smoking status: Former    Packs/day: 0.01    Years: 4.00    Total pack years: 0.04    Types: Cigars, Cigarettes   Smokeless tobacco: Never  Vaping Use   Vaping Use: Never used  Substance and Sexual Activity   Alcohol use: No   Drug use: No   Sexual activity: Not Currently    Birth control/protection: Surgical    Comment: tubal  Other Topics Concern   Not on file  Social History Narrative   Not on file    Social Determinants of Health   Financial Resource Strain: Low Risk  (05/01/2020)   Overall Financial Resource Strain (CARDIA)    Difficulty of Paying Living Expenses: Not hard at all  Food Insecurity: No Food Insecurity (05/01/2020)   Hunger Vital Sign    Worried About Running Out of Food in the Last Year: Never true    Ran Out of Food in the Last Year: Never true  Transportation Needs: No Transportation Needs (05/01/2020)   PRAPARE - Administrator, Civil Service (Medical): No    Lack of Transportation (Non-Medical): No  Physical Activity: Insufficiently Active (05/01/2020)   Exercise Vital Sign    Days of Exercise per Week: 3 days    Minutes of Exercise per Session: 20 min  Stress: Stress Concern Present (05/01/2020)   [name]    Feeling of Stress : To some extent  Social Connections: Socially Isolated (05/01/2020)   Social Connection and Isolation Panel [NHANES]    Frequency of Communication with Friends and Family: Twice a week    Frequency of Social Gatherings with Friends and Family: Twice a week    Attends Religious Services: Never  Active Member of Clubs or Organizations: No    Attends Banker Meetings: Never    Marital Status: Never married    Family History  Problem Relation Age of Onset   Lupus Mother    Cancer Paternal Grandfather    Atrial fibrillation Maternal Grandmother     Outpatient Encounter Medications as of 12/30/2021  Medication Sig   amoxicillin (AMOXIL) 875 MG tablet Take 1 tablet (875 mg total) by mouth 2 (two) times daily.   buPROPion (WELLBUTRIN XL) 150 MG 24 hr tablet Take 1 tablet (150 mg total) by mouth daily.   levothyroxine (SYNTHROID) 50 MCG tablet Take 1 tablet (50 mcg total) by mouth daily before breakfast.   Vitamin D, Ergocalciferol, (DRISDOL) 1.25 MG (50000 UNIT) CAPS capsule TAKE 1 CAPSULE BY MOUTH EVERY 7 DAYS.   [DISCONTINUED] fluconazole  (DIFLUCAN) 100 MG tablet Take 1 daily for 10 days   [DISCONTINUED] levothyroxine (SYNTHROID) 50 MCG tablet Take 1.5 tablets (75 mcg total) by mouth daily before breakfast.   [DISCONTINUED] levothyroxine (SYNTHROID) 75 MCG tablet TAKE 1 TABLET DAILY BEFORE BREAKFAST.   [DISCONTINUED] lidocaine (XYLOCAINE) 2 % solution Use as directed 10 mLs in the mouth or throat every 3 (three) hours as needed for mouth pain.   [DISCONTINUED] metroNIDAZOLE (FLAGYL) 500 MG tablet Take 1 tablet (500 mg total) by mouth 2 (two) times daily.   No facility-administered encounter medications on file as of 12/30/2021.    ALLERGIES: No Known Allergies  VACCINATION STATUS: Immunization History  Administered Date(s) Administered   Influenza,inj,Quad PF,6+ Mos 02/17/2017, 03/01/2020, 04/15/2021   PFIZER(Purple Top)SARS-COV-2 Vaccination 02/07/2020, 03/01/2020   PPD Test 03/11/2020, 03/25/2020   Tdap 09/16/2017     HPI  [name] is 31 y.o. female who presents today with her new labs and uptake and scan of her thyroid.    she was recently seen in consultation for hyperthyroidism requested by Anabel Halon, MD. 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  She is currently on levothyroxine 75 mcg p.o. daily before breakfast.  Her previsit labs are consistent with slight over-replacement.   She reports compliance with medication.  She has no new complaints today, presents with significant weight loss after she is engaging in some lifestyle medicine initiatives.      She denies palpitations, tremor, nor heat intolerance at this time.  she denies family history of thyroid dysfunction. -She denies family hx of thyroid cancer. she denies personal history of goiter.she  is willing to proceed with  appropriate work up and therapy for thyrotoxicosis.                           Review of systems  Constitutional: + Weight gain, + fatigue.      Objective:    BP 122/78   Pulse 72   Ht 5\' 4"  (1.626 m)   Wt 286 lb 12.8 oz (130.1 kg)   LMP 11/18/2021 (Exact Date)   BMI 49.23 kg/m   Wt Readings from Last 3 Encounters:  12/30/21 286 lb 12.8 oz (130.1 kg)  10/15/21 292 lb (132.5 kg)  09/29/21 293 lb 3.2 oz (133 kg)  Physical exam  Constitutional: Body mass index is 49.23 kg/m., not in acute distress, + normal state of mind   CMP     Component Value Date/Time   NA 138 12/25/2021 0824   K 4.5 12/25/2021 0824   CL 103 12/25/2021 0824   CO2 23 12/25/2021 0824   GLUCOSE 99 12/25/2021 0824   GLUCOSE 108 (H) 05/05/2018 0750   BUN 8 12/25/2021 0824   CREATININE 0.70 12/25/2021 0824   CALCIUM 9.5 12/25/2021 0824   PROT 7.3 12/25/2021 0824   ALBUMIN 4.2 12/25/2021 0824   AST 15 12/25/2021 0824   ALT 13 12/25/2021 0824   ALKPHOS 50 12/25/2021 0824   BILITOT 0.2 12/25/2021 0824   GFRNONAA 118 03/08/2020 0919   GFRAA 136 03/08/2020 0919     CBC    Component Value Date/Time   WBC 5.1 10/08/2021 0851   WBC 10.3 05/05/2018 0750   RBC 4.16 10/08/2021 0851   RBC 4.48 05/05/2018 0750   HGB 10.0 (L) 10/08/2021 0851   HCT 32.2 (L) 10/08/2021 0851   PLT 294 10/08/2021 0851   MCV 77 (L) 10/08/2021 0851   MCH 24.0 (L) 10/08/2021 0851   MCH 25.4 (L) 05/05/2018 0750   MCHC 31.1 (L) 10/08/2021 0851   MCHC 31.8 05/05/2018 0750   RDW 15.6 (H) 10/08/2021 0851   LYMPHSABS 2.4 10/08/2021 0851   MONOABS 0.9 05/05/2018 0750   EOSABS 0.1 10/08/2021 0851   BASOSABS 0.0 10/08/2021 0851    Diabetic Labs (most recent): Lab Results  Component Value Date   HGBA1C 5.9 (H) 10/08/2021   HGBA1C 5.8 (H) 03/08/2020   MICROALBUR 13.7 (H) 03/13/2020    Lipid Panel     Component Value Date/Time   CHOL 159 12/25/2021 0824   TRIG 83  12/25/2021 0824   HDL 55 12/25/2021 0824   CHOLHDL 2.9 12/25/2021 0824   LDLCALC 88 12/25/2021 0824   LABVLDL 16 12/25/2021 0824     Lab Results  Component Value Date   TSH 0.028 (L) 12/25/2021   TSH 0.022 (L) 10/08/2021   TSH 0.047 (L) 09/22/2021   TSH <0.005 (L) 06/24/2021   TSH 1.660 02/21/2021   TSH 11.900 (H) 01/13/2021   TSH <0.005 (L) 03/08/2020   TSH <0.005 (L) 03/08/2020   FREET4 1.33 12/25/2021   FREET4 1.55 10/08/2021   FREET4 1.44 09/22/2021   FREET4 1.91 (H) 06/24/2021   FREET4 1.16 02/21/2021   FREET4 0.74 (L) 01/13/2021   FREET4 1.41 03/08/2020    Thyroid uptake and scan on April 10, 2020 FINDINGS: Homogeneous tracer distribution in both thyroid lobes.  No focal areas of increased or decreased tracer localization.  4 hour I-123 uptake = 23.7% (normal 5-20%)  ,  24 hour I-123 uptake = 46.9% (normal 10-30%)   IMPRESSION: Normal thyroid scan.  Elevated 4 hour and 24 hour radio iodine uptakes.  Findings consistent with Graves disease.   Assessment & Plan:   1.  RAI induced hypothyroidism 2.  Graves' disease-resolved Her previsit thyroid function tests are consistent with slight over-replacement.  I discussed and lowered her levothyroxine to 50 mcg p.o. daily before breakfast.  It is likely that she will need a higher dose on subsequent visits.     - We discussed about the correct intake of her thyroid hormone, on empty stomach at fasting, with water, separated by at least 30 minutes from breakfast and other medications,  and separated by more than 4 hours from calcium, iron, multivitamins, acid reflux medications (PPIs). -  Patient is made aware of the fact that thyroid hormone replacement is needed for life, dose to be adjusted by periodic monitoring of thyroid function tests.  In light of the fact that she has prediabetes, hyperlipidemia, morbid obesity, she was approached for lifestyle medicine.  She has engaged somewhat and achieving some weight loss.   In light of her prediabetes, obesity, hyperlipidemia, she remains a candidate for lifestyle medicine.  - she acknowledges that there is a room for improvement in her food and drink choices. - Suggestion is made for her to avoid simple carbohydrates  from her diet including Cakes, Sweet Desserts, Ice Cream, Soda (diet and regular), Sweet Tea, Candies, Chips, Cookies, Store Bought Juices, Alcohol , Artificial Sweeteners,  Coffee Creamer, and "Sugar-free" Products, Lemonade. This will help patient to have more stable blood glucose profile and potentially avoid unintended weight gain.  The following Lifestyle Medicine recommendations according to American College of Lifestyle Medicine  [facility]) were discussed and and offered to patient and she  agrees to start the journey:  A. Whole Foods, Plant-Based Nutrition comprising of fruits and vegetables, plant-based proteins, whole-grain carbohydrates was discussed in detail with the patient.   A list for source of those nutrients were also provided to the patient.  Patient will use only water or unsweetened tea for hydration. B.  The need to stay away from risky substances including alcohol, smoking; obtaining 7 to 9 hours of restorative sleep, at least 150 minutes of moderate intensity exercise weekly, the importance of healthy social connections,  and stress management techniques were discussed. C.  A full color page of  Calorie density of various food groups per pound showing examples of each food groups was provided to the patient.   She will need continued follow-up to make sure she is not failing treatment.    -Patient is advised to maintain close follow up with Anabel Halon, MD for primary care needs.   I spent 32 minutes in the care of the patient today including review of labs from Thyroid Function, CMP, and other relevant labs ; imaging/biopsy records (current and previous including abstractions from other facilities); face-to-face time  discussing  her lab results and symptoms, medications doses, her options of short and long term treatment based on the latest standards of care / guidelines;   and documenting the encounter.  Linda Skinner  participated in the discussions, expressed understanding, and voiced agreement with the above plans.  All questions were answered to her satisfaction. she is encouraged to contact clinic should she have any questions or concerns prior to her return visit.    Follow up plan: Return in about 3 months (around 04/01/2022) for F/U with Pre-visit Labs, A1c -NV, NV with Perrin Eddleman.   Thank you for involving me in the care of this pleasant patient, and I will continue to update you with her progress.  Marquis Lunch, MD [facility] Phone: [phone]  Fax: [phone]   12/30/2021, 2:21 PM  This note was partially dictated with voice recognition software. Similar sounding words can be transcribed inadequately or may not  be corrected upon review.

## 2022-01-13 ENCOUNTER — Other Ambulatory Visit: Payer: Self-pay | Admitting: Adult Health

## 2022-01-15 ENCOUNTER — Ambulatory Visit (INDEPENDENT_AMBULATORY_CARE_PROVIDER_SITE_OTHER): Payer: Medicaid Other | Admitting: Internal Medicine

## 2022-01-15 ENCOUNTER — Encounter: Payer: Self-pay | Admitting: Internal Medicine

## 2022-01-15 DIAGNOSIS — F331 Major depressive disorder, recurrent, moderate: Secondary | ICD-10-CM | POA: Diagnosis not present

## 2022-01-15 MED ORDER — BUPROPION HCL ER (XL) 150 MG PO TB24: 150.0000 mg | ORAL_TABLET | Freq: Every day | ORAL | 5 refills | Status: DC

## 2022-01-15 NOTE — Progress Notes (Signed)
Virtual Visit via Telephone Note   This visit type was conducted due to national recommendations for restrictions regarding the COVID-19 Pandemic (e.g. social distancing) in an effort to limit this patient's exposure and mitigate transmission in our community.  Due to her co-morbid illnesses, this patient is at least at moderate risk for complications without adequate follow up.  This format is felt to be most appropriate for this patient at this time.  The patient did not have access to video technology/had technical difficulties with video requiring transitioning to audio format only (telephone).  All issues noted in this document were discussed and addressed.  No physical exam could be performed with this format.  Evaluation Performed:  Follow-up visit  Date:  01/15/2022   ID:  Linda Skinner, DOB [birth year], MRN [id number]  Patient Location: Home Provider Location: Office/Clinic  Participants: Patient Location of Patient: Home Location of Provider: Telehealth Consent was obtain for visit to be over via telehealth. I verified that I am speaking with the correct person using two identifiers.  PCP:  Anabel Halon, MD   Chief Complaint: Follow-up of depression  History of Present Illness:    Linda Skinner is a 31 y.o. female who has a televisit for follow-up of depression.  She was switched to Wellbutrin in the last visit for depression as she was not getting any improvement with Lexapro 20 mg daily.  She has noticed improvement in her anhedonia and sleep with Wellbutrin.  She still has mood fluctuations at times, but denies any SI or HI.  Of note, her dose of levothyroxine was also recently adjusted.  The patient does not have symptoms concerning for COVID-19 infection (fever, chills, cough, or new shortness of breath).   Past Medical, Surgical, Social History, Allergies, and Medications have been Reviewed.  Past Medical History:  Diagnosis Date   BV (bacterial vaginosis)  11/12/2021   11/12/21 rx flagyl   Encounter for tubal ligation 09/16/2017   Gestational diabetes    metformin   Graves disease    Maternal varicella, non-immune 02/18/2017   Immunize p delivery   Medical history non-contributory    Supervision of high risk pregnancy, antepartum 02/17/2017    [facility] [place] [facility] at  [facility] [place] Dating By LMP c/w 1st trimester U/S 7wk Pap 05/23/15 neg [facility] (got records) GC/CT Initial:   -/-             36+wks:-/- Genetic Screen NT/IT: neg CF screen declined Anatomic [place] Normal female Flu vaccine 02/17/17  Tdap Recommended ~ 28wks Glucose Screen  2 hr   99/122/114  GDM GBS pos Feed Preference both Contraception BTL, consent 02/06/1   SVD (spontaneous vaginal delivery) 09/15/2017   Past Surgical History:  Procedure Laterality Date   MOUTH SURGERY     TUBAL LIGATION N/A 09/15/2017   Procedure: POST PARTUM TUBAL LIGATION;  Surgeon: Kathrynn Running, MD;  Location: [facility]IR[facility] SU[facility];  Service: Gynecology;  Laterality: N/A;   WISDOM TOOTH EXTRACTION       No outpatient medications have been marked as taking for the 01/15/22 encounter (Office Visit) with Anabel Halon, MD.     Allergies:   Patient has no known allergies.   ROS:   Please see the history of present illness.     All other systems reviewed and are negative.   Labs/Other Tests and Data Reviewed:    Recent Labs: 10/08/2021: Hemoglobin 10.0; Platelets 294 12/25/2021: ALT 13; BUN 8; Creatinine, Ser  0.70; Potassium 4.5; Sodium 138; TSH 0.028   Recent Lipid Panel Lab Results  Component Value Date/Time   CHOL 159 12/25/2021 08:24 AM   TRIG 83 12/25/2021 08:24 AM   HDL 55 12/25/2021 08:24 AM   CHOLHDL 2.9 12/25/2021 08:24 AM   LDLCALC 88 12/25/2021 08:24 AM    Wt Readings from Last 3 Encounters:  12/30/21 286 lb 12.8 oz (130.1 kg)  10/15/21 292 lb (132.5 kg)  09/29/21 293 lb 3.2 oz (133 kg)     ASSESSMENT & PLAN:    Depression, major, recurrent, moderate (HCC)     01/15/2022    4:01 PM 10/15/2021    8:45 AM 04/15/2021    8:53 AM  PHQ9 SCORE ONLY  PHQ-9 Total Score 8 12 10    Some symptoms of mood changes would be because of hypothyroidism Was on Lexapro 20 mg QD Had added Wellbutrin 150 mg QD, symptoms have improved - will continue for now   Time:   Today, I have spent 15 minutes reviewing the chart, including problem list, medications, and with the patient with telehealth technology discussing the above problems.   Medication Adjustments/Labs and Tests Ordered: Current medicines are reviewed at length with the patient today.  Concerns regarding medicines are outlined above.   Tests Ordered: No orders of the defined types were placed in this encounter.   Medication Changes: No orders of the defined types were placed in this encounter.    Note: This dictation was prepared with Dragon dictation along with smaller phrase technology. Similar sounding words can be transcribed inadequately or may not be corrected upon review. Any transcriptional errors that result from this process are unintentional.      Disposition:  Follow up  Signed, , MD  01/15/2022 4:39 PM     01/17/2022 Primary Care [facility]

## 2022-01-15 NOTE — Assessment & Plan Note (Addendum)
    01/15/2022    4:01 PM 10/15/2021    8:45 AM 04/15/2021    8:53 AM  PHQ9 SCORE ONLY  PHQ-9 Total Score 8 12 10    Some symptoms of mood changes would be because of hypothyroidism Was on Lexapro 20 mg QD Had added Wellbutrin 150 mg QD, symptoms have improved - will continue for now

## 2022-01-15 NOTE — Patient Instructions (Signed)
Please continue taking medications as prescribed.  Please continue to follow low carb diet and perform moderate exercise/walking at least 150 mins/week. 

## 2022-02-15 ENCOUNTER — Other Ambulatory Visit: Payer: Self-pay | Admitting: Internal Medicine

## 2022-02-15 DIAGNOSIS — E559 Vitamin D deficiency, unspecified: Secondary | ICD-10-CM

## 2022-03-02 ENCOUNTER — Ambulatory Visit
Admission: EM | Admit: 2022-03-02 | Discharge: 2022-03-02 | Disposition: A | Discharge status: Home / Self Care | Payer: Medicaid Other | Attending: Nurse Practitioner | Admitting: Nurse Practitioner

## 2022-03-02 DIAGNOSIS — J069 Acute upper respiratory infection, unspecified: Secondary | ICD-10-CM | POA: Diagnosis present

## 2022-03-02 DIAGNOSIS — Z1152 Encounter for screening for COVID-19: Secondary | ICD-10-CM | POA: Diagnosis present

## 2022-03-02 MED ORDER — BENZONATATE 100 MG PO CAPS: 100.0000 mg | ORAL_CAPSULE | Freq: Three times a day (TID) | ORAL | 0 refills | Status: DC | PRN

## 2022-03-02 NOTE — ED Provider Notes (Signed)
[facility]    CSN: [id number] Arrival date & time: 03/02/22  1050      History   Chief Complaint Chief Complaint  Patient presents with   Sore Throat   Cough   Diarrhea    HPI Linda Skinner is a 31 y.o. female.   Patient presents for 1 day of feeling poorly including dry cough, pain in her chest with coughing, chest congestion, postnasal drainage, sneezing, sore throat, headache, nausea, diarrhea, decreased appetite, and fatigue.  She denies fever, body aches, chills, chest pain at rest, shortness of breath or wheezing, chest tightness, nasal congestion, runny nose, ear pain, abdominal pain, vomiting, and loss of taste or smell.  No new rash.  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  Has taken allergy medicine without much relief.    Past Medical History:  Diagnosis Date   BV (bacterial vaginosis) 11/12/2021   11/12/21 rx flagyl   Encounter for tubal ligation 09/16/2017   Gestational diabetes    metformin   Graves disease    Maternal varicella, non-immune 02/18/2017   Immunize p delivery   Medical history non-contributory    Supervision of high risk pregnancy, antepartum 02/17/2017    [place] Initiated Care at  [place] Dating By LMP c/w 1st trimester U/S 7wk Pap 05/23/15 neg [facility] (got records) GC/CT Initial:   -/-             36+wks:-/- Genetic Screen NT/IT: neg CF screen declined Anatomic [place] Normal female Flu vaccine 02/17/17  Tdap Recommended ~ 28wks Glucose Screen  2 hr   99/122/114  GDM GBS pos Feed Preference both Contraception BTL, consent 02/06/1   SVD (spontaneous vaginal delivery) 09/15/2017    Patient Active Problem List   Diagnosis Date Noted   BV (bacterial vaginosis) 11/12/2021   OSA (obstructive sleep apnea) 10/15/2021   Hypothyroidism following radioiodine therapy 09/29/2021   Mixed hyperlipidemia 09/29/2021   Hypothyroidism 04/15/2021   Encounter for general adult medical examination with abnormal findings 04/15/2021    Prediabetes 04/15/2021   Tinea versicolor 01/13/2021   Graves disease 04/12/2020   Depression, major, recurrent, moderate ([place]) 03/08/2020   Morbid obesity ([place]) 03/08/2020   Gestational diabetes mellitus, class A2 06/17/2017   Ovarian cyst 05/26/2017    Past Surgical History:  Procedure Laterality Date   MOUTH SURGERY     TUBAL LIGATION N/A 09/15/2017   Procedure: POST PARTUM TUBAL LIGATION;  Surgeon: Gwynne Edinger, MD;  Location: [place];  Service: Gynecology;  Laterality: N/A;   WISDOM TOOTH EXTRACTION      OB History     Gravida  6   Para  4   Term  4   Preterm      AB  2   Living  4      SAB      IAB      Ectopic      Multiple  0   Live Births  4            Home Medications    Prior to Admission medications   Medication Sig Start Date End Date Taking? Authorizing Provider  benzonatate (TESSALON) 100 MG capsule Take 1 capsule (100 mg total) by mouth 3 (three) times daily as needed for cough. Do not take with alcohol or while driving or operating heavy machinery.  May cause drowsiness. 03/02/22  Yes Noemi Chapel A, NP  buPROPion (WELLBUTRIN XL) 150 MG 24 hr tablet Take 1 tablet (  150 mg total) by mouth daily. 01/15/22   Lindell Spar, MD  levothyroxine (SYNTHROID) 50 MCG tablet Take 1 tablet (50 mcg total) by mouth daily before breakfast. 12/30/21   Nida, Marella Chimes, MD  Vitamin D, Ergocalciferol, (DRISDOL) 1.25 MG (50000 UNIT) CAPS capsule TAKE 1 CAPSULE BY MOUTH EVERY 7 DAYS. 02/16/22   Lindell Spar, MD    Family History Family History  Problem Relation Age of Onset   Lupus Mother    Cancer Paternal Grandfather    Atrial fibrillation Maternal Grandmother     Social History Social History   Tobacco Use   Smoking status: Former    Packs/day: 0.01    Years: 4.00    Total pack years: 0.04    Types: Cigars, Cigarettes   Smokeless tobacco: Never  Vaping Use   Vaping Use: Never used  Substance Use Topics   Alcohol  use: No   Drug use: No     Allergies   Patient has no known allergies.   Review of Systems Review of Systems Per HPI  Physical Exam Triage Vital Signs ED Triage Vitals  Enc Vitals Group     BP 03/02/22 1117 117/77     Pulse Rate 03/02/22 1117 77     Resp 03/02/22 1117 20     Temp 03/02/22 1117 99.2 F (37.3 C)     Temp src --      SpO2 03/02/22 1117 97 %     Weight --      Height --      Head Circumference --      Peak Flow --      Pain Score 03/02/22 1115 5     Pain Loc --      Pain Edu? --      Excl. in [place]? --    No data found.  Updated Vital Signs BP 117/77   Pulse 77   Temp 99.2 F (37.3 C)   Resp 20   LMP 02/16/2022   SpO2 97%   Visual Acuity Right Eye Distance:   Left Eye Distance:   Bilateral Distance:    Right Eye Near:   Left Eye Near:    Bilateral Near:     Physical Exam Vitals and nursing note reviewed.  Constitutional:      General: She is not in acute distress.    Appearance: Normal appearance. She is not ill-appearing or toxic-appearing.  HENT:     Head: Normocephalic and atraumatic.     Right Ear: Tympanic membrane, ear canal and external ear normal.     Left Ear: Tympanic membrane, ear canal and external ear normal.     Nose: Congestion and rhinorrhea present.     Mouth/Throat:     Mouth: Mucous membranes are moist.     Pharynx: Oropharynx is clear. Posterior oropharyngeal erythema present. No oropharyngeal exudate.  Eyes:     General: No scleral icterus.    Extraocular Movements: Extraocular movements intact.  Cardiovascular:     Rate and Rhythm: Normal rate and regular rhythm.  Pulmonary:     Effort: Pulmonary effort is normal. No respiratory distress.     Breath sounds: Normal breath sounds. No wheezing, rhonchi or rales.  Abdominal:     General: Abdomen is flat. Bowel sounds are normal. There is no distension.     Palpations: Abdomen is soft.     Tenderness: There is no abdominal tenderness.  Musculoskeletal:      Cervical back: Normal range  of motion and neck supple.  Lymphadenopathy:     Cervical: Cervical adenopathy present.  Skin:    General: Skin is warm and dry.     Coloration: Skin is not jaundiced or pale.     Findings: No erythema or rash.  Neurological:     Mental Status: She is alert and oriented to person, place, and time.  Psychiatric:        Behavior: Behavior is cooperative.      UC Treatments / Results  Labs (all labs ordered are listed, but only abnormal results are displayed) Labs Reviewed  SARS CORONAVIRUS 2 (TAT 6-24 HRS)    EKG   Radiology No results found.  Procedures Procedures (including critical care time)  Medications Ordered in UC Medications - No data to display  Initial Impression / Assessment and Plan / UC Course  I have reviewed the triage vital signs and the nursing notes.  Pertinent labs & imaging results that were available during my care of the patient were reviewed by me and considered in my medical decision making (see chart for details).    Patient is well-appearing, normotensive, afebrile, not tachycardic, not tachypneic, oxygenating well on room air.  Suspect viral etiology.  COVID-19 testing obtained.  She is a candidate for Paxlovid if she test positive; last GFR greater than 60.  Supportive care discussed, start cough suppressants every 8 hours as needed.  ER and return precautions discussed.  Note given for work.  The patient was given the opportunity to ask questions.  All questions answered to their satisfaction.  The patient is in agreement to this plan.    Final Clinical Impressions(s) / UC Diagnoses   Final diagnoses:  Encounter for screening for COVID-19  Viral URI with cough     Discharge Instructions      You have a viral upper respiratory infection.  This should improve within next week to 10 days.  If your symptoms do not improve over this timeframe or if they worsen, follow-up here with primary care provider.  With any  sudden onset of chest pain or shortness of breath, go to the emergency room.    We have tested you today for COVID-19.  You will see the results in Mychart and we will call you with positive results.    Please stay home and isolate until you are aware of the results.    Some things that can make you feel better are: - Increased rest. - Increasing fluid with water/sugar free electrolytes. - Acetaminophen and ibuprofen as needed for fever/pain.  - Salt water gargling, chloraseptic spray and throat lozenges. - OTC guaifenesin (Mucinex) 600 mg twice daily.  - Saline sinus flushes or a neti pot.  - Humidifying the air. - Cough perles during the day for dry cough every 8 hours.     ED Prescriptions     Medication Sig Dispense Auth. Provider   benzonatate (TESSALON) 100 MG capsule Take 1 capsule (100 mg total) by mouth 3 (three) times daily as needed for cough. Do not take with alcohol or while driving or operating heavy machinery.  May cause drowsiness. 21 capsule Eulogio Bear, NP      PDMP not reviewed this encounter.   Eulogio Bear, NP 03/02/22 1137

## 2022-03-02 NOTE — ED Triage Notes (Signed)
Pt presents with c/o cough, sore throat and diarrhea that began yesterday

## 2022-03-02 NOTE — Discharge Instructions (Addendum)
You have a viral upper respiratory infection.  This should improve within next week to 10 days.  If your symptoms do not improve over this timeframe or if they worsen, follow-up here with primary care provider.  With any sudden onset of chest pain or shortness of breath, go to the emergency room.    We have tested you today for COVID-19.  You will see the results in Mychart and we will call you with positive results.    Please stay home and isolate until you are aware of the results.    Some things that can make you feel better are: - Increased rest. - Increasing fluid with water/sugar free electrolytes. - Acetaminophen and ibuprofen as needed for fever/pain.  - Salt water gargling, chloraseptic spray and throat lozenges. - OTC guaifenesin (Mucinex) 600 mg twice daily.  - Saline sinus flushes or a neti pot.  - Humidifying the air. - Cough perles during the day for dry cough every 8 hours.

## 2022-03-03 LAB — SARS CORONAVIRUS 2 (TAT 6-24 HRS): SARS Coronavirus 2: NEGATIVE

## 2022-03-28 LAB — TSH: TSH: 0.07 u[IU]/mL — ABNORMAL LOW (ref 0.450–4.500)

## 2022-03-28 LAB — T4, FREE: Free T4: 1.39 ng/dL (ref 0.82–1.77)

## 2022-04-01 ENCOUNTER — Ambulatory Visit: Payer: Medicaid Other | Admitting: "Endocrinology

## 2022-04-02 ENCOUNTER — Encounter: Payer: Self-pay | Admitting: "Endocrinology

## 2022-04-02 ENCOUNTER — Ambulatory Visit (INDEPENDENT_AMBULATORY_CARE_PROVIDER_SITE_OTHER): Payer: Medicaid Other | Admitting: "Endocrinology

## 2022-04-02 VITALS — BP 130/82 | HR 72 | Ht 64.0 in | Wt 286.0 lb

## 2022-04-02 DIAGNOSIS — R7303 Prediabetes: Secondary | ICD-10-CM | POA: Diagnosis not present

## 2022-04-02 DIAGNOSIS — E559 Vitamin D deficiency, unspecified: Secondary | ICD-10-CM | POA: Diagnosis not present

## 2022-04-02 DIAGNOSIS — E0500 Thyrotoxicosis with diffuse goiter without thyrotoxic crisis or storm: Secondary | ICD-10-CM | POA: Diagnosis not present

## 2022-04-02 DIAGNOSIS — E890 Postprocedural hypothyroidism: Secondary | ICD-10-CM

## 2022-04-02 LAB — POCT GLYCOSYLATED HEMOGLOBIN (HGB A1C): HbA1c, POC (controlled diabetic range): 5.6 % (ref 0.0–7.0)

## 2022-04-02 MED ORDER — LEVOTHYROXINE SODIUM 25 MCG PO TABS
25.0000 ug | ORAL_TABLET | Freq: Every day | ORAL | 1 refills | Status: DC
Start: 2022-04-02 — End: 2022-08-03

## 2022-04-02 NOTE — Progress Notes (Signed)
04/02/2022    Endocrinology follow-up note  Subjective:    Patient ID: Linda Skinner, female    DOB: 10-Jun-1990, PCP Lindell Spar, MD.   Past Medical History:  Diagnosis Date   BV (bacterial vaginosis) 11/12/2021   11/12/21 rx flagyl   Encounter for tubal ligation 09/16/2017   Gestational diabetes    metformin   Graves disease    Maternal varicella, non-immune 02/18/2017   Immunize p delivery   Medical history non-contributory    Supervision of high risk pregnancy, antepartum 02/17/2017    [place] Initiated Care at  [place] Dating By LMP c/w 1st trimester U/S 7wk Pap 05/23/15 neg [facility] (got records) GC/CT Initial:   -/-             36+wks:-/- Genetic Screen NT/IT: neg CF screen declined Anatomic [place] Normal female Flu vaccine 02/17/17  Tdap Recommended ~ 28wks Glucose Screen  2 hr   99/122/114  GDM GBS pos Feed Preference both Contraception BTL, consent 02/06/1   SVD (spontaneous vaginal delivery) 09/15/2017    Past Surgical History:  Procedure Laterality Date   MOUTH SURGERY     TUBAL LIGATION N/A 09/15/2017   Procedure: POST PARTUM TUBAL LIGATION;  Surgeon: Gwynne Edinger, MD;  Location: [place];  Service: Gynecology;  Laterality: N/A;   WISDOM TOOTH EXTRACTION      Social History   Socioeconomic History   Marital status: Single    Spouse name: Not on file   Number of children: Not on file   Years of education: Not on file   Highest education level: Not on file  Occupational History   Not on file  Tobacco Use   Smoking status: Former    Packs/day: 0.01    Years: 4.00    Total pack years: 0.04    Types: Cigars, Cigarettes   Smokeless tobacco: Never  Vaping Use   Vaping Use: Never used  Substance and Sexual Activity   Alcohol use: No   Drug use: No   Sexual activity: Not Currently    Birth control/protection: Surgical    Comment: tubal  Other Topics Concern   Not on file  Social History Narrative   Not on file    Social Determinants of Health   Financial Resource Strain: Low Risk  (05/01/2020)   Overall Financial Resource Strain (CARDIA)    Difficulty of Paying Living Expenses: Not hard at all  Food Insecurity: No Food Insecurity (05/01/2020)   Hunger Vital Sign    Worried About Running Out of Food in the Last Year: Never true    Ran Out of Food in the Last Year: Never true  Transportation Needs: No Transportation Needs (05/01/2020)   PRAPARE - Hydrologist (Medical): No    Lack of Transportation (Non-Medical): No  Physical Activity: Insufficiently Active (05/01/2020)   Exercise Vital Sign    Days of Exercise per Week: 3 days    Minutes of Exercise per Session: 20 min  Stress: Stress Concern Present (05/01/2020)   [place]    Feeling of Stress : To some extent  Social Connections: Socially Isolated (05/01/2020)   Social Connection and Isolation Panel [NHANES]    Frequency of Communication with Friends and Family: Twice a week    Frequency of Social Gatherings with Friends and Family: Twice a week    Attends Religious Services: Never  Active Member of Clubs or Organizations: No    Attends Archivist Meetings: Never    Marital Status: Never married    Family History  Problem Relation Age of Onset   Lupus Mother    Cancer Paternal Grandfather    Atrial fibrillation Maternal Grandmother     Outpatient Encounter Medications as of 04/02/2022  Medication Sig   buPROPion (WELLBUTRIN XL) 150 MG 24 hr tablet Take 1 tablet (150 mg total) by mouth daily.   levothyroxine (SYNTHROID) 25 MCG tablet Take 1 tablet (25 mcg total) by mouth daily before breakfast.   Vitamin D, Ergocalciferol, (DRISDOL) 1.25 MG (50000 UNIT) CAPS capsule TAKE 1 CAPSULE BY MOUTH EVERY 7 DAYS.   [DISCONTINUED] benzonatate (TESSALON) 100 MG capsule Take 1 capsule (100 mg total) by mouth 3 (three) times daily as needed for  cough. Do not take with alcohol or while driving or operating heavy machinery.  May cause drowsiness.   [DISCONTINUED] levothyroxine (SYNTHROID) 50 MCG tablet Take 1 tablet (50 mcg total) by mouth daily before breakfast.   No facility-administered encounter medications on file as of 04/02/2022.    ALLERGIES: No Known Allergies  VACCINATION STATUS: Immunization History  Administered Date(s) Administered   Influenza,inj,Quad PF,6+ Mos 02/17/2017, 03/01/2020, 04/15/2021   PFIZER(Purple Top)SARS-COV-2 Vaccination 02/07/2020, 03/01/2020   PPD Test 03/11/2020, 03/25/2020   Tdap 09/16/2017     HPI  Eleny Laday is 31 y.o. female who presents today with her new labs and uptake and scan of her thyroid.    she was recently seen in consultation for hyperthyroidism requested by Lindell Spar, MD. 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  She is currently on levothyroxine 50 mcg p.o. daily before breakfast.  Her previsit labs are still consistent with slight over-replacement.     She reports compliance with medication.  She has no new complaints today, presents with steady weight since last visit.  She maintained the changes she made on her lifestyle.     She denies palpitations, tremor, nor heat intolerance at this time.  she denies family history of thyroid dysfunction. -She denies family hx of thyroid cancer. she denies personal history of goiter.she  is willing to proceed with appropriate work up and therapy for thyrotoxicosis.                           Review of systems  Constitutional: + Weight gain, + fatigue.      Objective:    BP 130/82   Pulse 72   Ht 5\' 4"  (1.626 m)   Wt 286 lb (129.7 kg)   BMI 49.09 kg/m   Wt Readings from Last 3 Encounters:  04/02/22  286 lb (129.7 kg)  12/30/21 286 lb 12.8 oz (130.1 kg)  10/15/21 292 lb (132.5 kg)                                                Physical exam  Constitutional: Body mass index is 49.09 kg/m., not in acute distress, + normal state of mind   CMP     Component Value Date/Time   NA 138 12/25/2021 0824   K 4.5 12/25/2021 [id number]  CL 103 12/25/2021 0824   CO2 23 12/25/2021 0824   GLUCOSE 99 12/25/2021 0824   GLUCOSE 108 (H) 05/05/2018 0750   BUN 8 12/25/2021 0824   CREATININE 0.70 12/25/2021 0824   CALCIUM 9.5 12/25/2021 0824   PROT 7.3 12/25/2021 0824   ALBUMIN 4.2 12/25/2021 0824   AST 15 12/25/2021 0824   ALT 13 12/25/2021 0824   ALKPHOS 50 12/25/2021 0824   BILITOT 0.2 12/25/2021 0824   GFRNONAA 118 03/08/2020 0919   GFRAA 136 03/08/2020 0919     CBC    Component Value Date/Time   WBC 5.1 10/08/2021 0851   WBC 10.3 05/05/2018 0750   RBC 4.16 10/08/2021 0851   RBC 4.48 05/05/2018 0750   HGB 10.0 (L) 10/08/2021 0851   HCT 32.2 (L) 10/08/2021 0851   PLT 294 10/08/2021 0851   MCV 77 (L) 10/08/2021 0851   MCH 24.0 (L) 10/08/2021 0851   MCH 25.4 (L) 05/05/2018 0750   MCHC 31.1 (L) 10/08/2021 0851   MCHC 31.8 05/05/2018 0750   RDW 15.6 (H) 10/08/2021 0851   LYMPHSABS 2.4 10/08/2021 0851   MONOABS 0.9 05/05/2018 0750   EOSABS 0.1 10/08/2021 0851   BASOSABS 0.0 10/08/2021 0851    Diabetic Labs (most recent): Lab Results  Component Value Date   HGBA1C 5.6 04/02/2022   HGBA1C 5.9 (H) 10/08/2021   HGBA1C 5.8 (H) 03/08/2020   MICROALBUR 13.7 (H) 03/13/2020    Lipid Panel     Component Value Date/Time   CHOL 159 12/25/2021 0824   TRIG 83 12/25/2021 0824   HDL 55 12/25/2021 0824   CHOLHDL 2.9 12/25/2021 0824   LDLCALC 88 12/25/2021 0824   LABVLDL 16 12/25/2021 0824     Lab Results  Component Value Date   TSH 0.070 (L) 03/26/2022   TSH 0.028 (L) 12/25/2021   TSH 0.022 (L) 10/08/2021   TSH 0.047 (L) 09/22/2021   TSH <0.005 (L) 06/24/2021   TSH 1.660  02/21/2021   TSH 11.900 (H) 01/13/2021   TSH <0.005 (L) 03/08/2020   TSH <0.005 (L) 03/08/2020   FREET4 1.39 03/26/2022   FREET4 1.33 12/25/2021   FREET4 1.55 10/08/2021   FREET4 1.44 09/22/2021   FREET4 1.91 (H) 06/24/2021   FREET4 1.16 02/21/2021   FREET4 0.74 (L) 01/13/2021   FREET4 1.41 03/08/2020    Thyroid uptake and scan on April 10, 2020 FINDINGS: Homogeneous tracer distribution in both thyroid lobes.  No focal areas of increased or decreased tracer localization.  4 hour I-123 uptake = 23.7% (normal 5-20%)  ,  24 hour I-123 uptake = 46.9% (normal 10-30%)   IMPRESSION: Normal thyroid scan.  Elevated 4 hour and 24 hour radio iodine uptakes.  Findings consistent with Graves disease.   Assessment & Plan:   1.  RAI induced hypothyroidism 2.  Graves' disease-resolved Her previsit thyroid function tests are consistent with slight over-replacement.  I discussed and lowered her levothyroxine to 25 mcg p.o. daily before breakfast.   It is likely that she still has a functioning thyroid tissue.  - We discussed about the correct intake of her thyroid hormone, on empty stomach at fasting, with water, separated by at least 30 minutes from breakfast and other medications,  and separated by more than 4 hours from calcium, iron, multivitamins, acid reflux medications (PPIs). -Patient is made aware of the fact that thyroid hormone replacement is needed for life, dose to be adjusted by periodic monitoring of thyroid function tests.   In light of  the fact that she has prediabetes, hyperlipidemia, morbid obesity, she was approached for lifestyle medicine.  She has engaged somewhat and achieved some weight loss.  She remains to be a good candidate for lifestyle medicine.  - she acknowledges that there is a room for improvement in her food and drink choices. - Suggestion is made for her to avoid simple carbohydrates  from her diet including Cakes, Sweet Desserts, Ice Cream, Soda (diet and  regular), Sweet Tea, Candies, Chips, Cookies, Store Bought Juices, Alcohol , Artificial Sweeteners,  Coffee Creamer, and "Sugar-free" Products, Lemonade. This will help patient to have more stable blood glucose profile and potentially avoid unintended weight gain.  The following Lifestyle Medicine recommendations according to [facility]) were discussed and and offered to patient and she  agrees to start the journey:  A. Whole Foods, Plant-Based Nutrition comprising of fruits and vegetables, plant-based proteins, whole-grain carbohydrates was discussed in detail with the patient.   A list for source of those nutrients were also provided to the patient.  Patient will use only water or unsweetened tea for hydration. B.  The need to stay away from risky substances including alcohol, smoking; obtaining 7 to 9 hours of restorative sleep, at least 150 minutes of moderate intensity exercise weekly, the importance of healthy social connections,  and stress management techniques were discussed. C.  A full color page of  Calorie density of various food groups per pound showing examples of each food groups was provided to the patient.  He is advised to continue on vitamin D supplements.    -Patient is advised to maintain close follow up with Lindell Spar, MD for primary care needs.   I spent 32 minutes in the care of the patient today including review of labs from Thyroid Function, CMP, and other relevant labs ; imaging/biopsy records (current and previous including abstractions from other facilities); face-to-face time discussing  her lab results and symptoms, medications doses, her options of short and long term treatment based on the latest standards of care / guidelines;   and documenting the encounter.  Linda Skinner  participated in the discussions, expressed understanding, and voiced agreement with the above plans.  All questions were answered to her satisfaction. she  is encouraged to contact clinic should she have any questions or concerns prior to her return visit.    Follow up plan: Return in about 4 months (around 08/01/2022) for Fasting Labs  in AM B4 8.   Thank you for involving me in the care of this pleasant patient, and I will continue to update you with her progress.  Glade Lloyd, MD [facility] Phone: [phone]  Fax: [phone]   04/02/2022, 7:01 PM  This note was partially dictated with voice recognition software. Similar sounding words can be transcribed inadequately or may not  be corrected upon review.

## 2022-04-11 ENCOUNTER — Other Ambulatory Visit: Payer: Self-pay | Admitting: Adult Health

## 2022-04-13 ENCOUNTER — Telehealth: Payer: Self-pay | Admitting: Adult Health

## 2022-04-13 NOTE — Telephone Encounter (Signed)
Patient request diflucan rx. Please advise.

## 2022-04-14 MED ORDER — FLUCONAZOLE 150 MG PO TABS: ORAL_TABLET | ORAL | 1 refills | Status: DC

## 2022-04-14 NOTE — Telephone Encounter (Signed)
Pt aware that diflucan sent to [facility]

## 2022-04-14 NOTE — Addendum Note (Signed)
Addended by: Cyril Mourning A on: 04/14/2022 08:38 AM   Modules accepted: Orders

## 2022-04-30 ENCOUNTER — Other Ambulatory Visit: Payer: Self-pay | Admitting: Internal Medicine

## 2022-04-30 DIAGNOSIS — E559 Vitamin D deficiency, unspecified: Secondary | ICD-10-CM

## 2022-05-07 ENCOUNTER — Encounter: Payer: Self-pay | Admitting: Emergency Medicine

## 2022-05-07 ENCOUNTER — Ambulatory Visit
Admission: EM | Admit: 2022-05-07 | Discharge: 2022-05-07 | Disposition: A | Discharge status: Home / Self Care | Payer: Medicaid Other | Attending: Family Medicine | Admitting: Family Medicine

## 2022-05-07 DIAGNOSIS — Z1152 Encounter for screening for COVID-19: Secondary | ICD-10-CM | POA: Diagnosis not present

## 2022-05-07 DIAGNOSIS — J069 Acute upper respiratory infection, unspecified: Secondary | ICD-10-CM | POA: Diagnosis not present

## 2022-05-07 LAB — RESP PANEL BY RT-PCR (FLU A&B, COVID) ARPGX2
Influenza A by PCR: NEGATIVE
Influenza B by PCR: NEGATIVE
SARS Coronavirus 2 by RT PCR: NEGATIVE

## 2022-05-07 MED ORDER — PROMETHAZINE-DM 6.25-15 MG/5ML PO SYRP: 5.0000 mL | ORAL_SOLUTION | Freq: Four times a day (QID) | ORAL | 0 refills | Status: DC | PRN

## 2022-05-07 MED ORDER — FLUTICASONE PROPIONATE 50 MCG/ACT NA SUSP: 1.0000 | Freq: Two times a day (BID) | NASAL | 2 refills | Status: DC

## 2022-05-07 NOTE — ED Provider Notes (Signed)
[facility]    CSN: [id number] Arrival date & time: 05/07/22  1214      History   Chief Complaint No chief complaint on file.   HPI Linda Skinner is a 31 y.o. female.   Patient presenting today with 2-day history of sore throat, body aches, chills, fatigue, headaches, diarrhea, left ear pain, nasal congestion.  Denies chest pain, shortness of breath, abdominal pain, nausea vomiting or diarrhea.  So far trying Tylenol with minimal relief.  Audible sick contacts recently.    Past Medical History:  Diagnosis Date   BV (bacterial vaginosis) 11/12/2021   11/12/21 rx flagyl   Encounter for tubal ligation 09/16/2017   Gestational diabetes    metformin   Graves disease    Maternal varicella, non-immune 02/18/2017   Immunize p delivery   Medical history non-contributory    Supervision of high risk pregnancy, antepartum 02/17/2017    [facility] at  [facility] [place] [place] Dating By LMP c/w 1st trimester U/S 7wk Pap 05/23/15 neg [facility] (got records) GC/CT Initial:   -/-             36+wks:-/- Genetic Screen NT/IT: neg CF screen declined Anatomic [place] Normal female Flu vaccine 02/17/17  Tdap Recommended ~ 28wks Glucose Screen  2 hr   99/122/114  GDM GBS pos Feed Preference both Contraception BTL, consent 02/06/1   SVD (spontaneous vaginal delivery) 09/15/2017    Patient Active Problem List   Diagnosis Date Noted   Vitamin D deficiency 04/02/2022   BV (bacterial vaginosis) 11/12/2021   OSA (obstructive sleep apnea) 10/15/2021   Hypothyroidism following radioiodine therapy 09/29/2021   Mixed hyperlipidemia 09/29/2021   Hypothyroidism 04/15/2021   Encounter for general adult medical examination with abnormal findings 04/15/2021   Prediabetes 04/15/2021   Tinea versicolor 01/13/2021   Graves disease 04/12/2020   Depression, major, recurrent, moderate (HCC) 03/08/2020   Morbid obesity (HCC) 03/08/2020   Gestational diabetes mellitus, class A2 06/17/2017    Ovarian cyst 05/26/2017    Past Surgical History:  Procedure Laterality Date   MOUTH SURGERY     TUBAL LIGATION N/A 09/15/2017   Procedure: POST PARTUM TUBAL LIGATION;  Surgeon: Kathrynn Running, MD;  Location: [facility];  Service: Gynecology;  Laterality: N/A;   WISDOM TOOTH EXTRACTION      OB History     Gravida  6   Para  4   Term  4   Preterm      AB  2   Living  4      SAB      IAB      Ectopic      Multiple  0   Live Births  4            Home Medications    Prior to Admission medications   Medication Sig Start Date End Date Taking? Authorizing Provider  fluconazole (DIFLUCAN) 150 MG tablet Take 1 now and repeat 1 in 3 days if needed 04/14/22   Adline Potter, NP  fluticasone (FLONASE) 50 MCG/ACT nasal spray Place 1 spray into both nostrils 2 (two) times daily. 05/07/22  Yes Particia Nearing, PA-C  promethazine-dextromethorphan (PROMETHAZINE-DM) 6.25-15 MG/5ML syrup Take 5 mLs by mouth 4 (four) times daily as needed. 05/07/22  Yes Particia Nearing, PA-C  buPROPion (WELLBUTRIN XL) 150 MG 24 hr tablet Take 1 tablet (150 mg total) by mouth daily. 01/15/22   Anabel Halon, MD  levothyroxine (SYNTHROID)  25 MCG tablet Take 1 tablet (25 mcg total) by mouth daily before breakfast. 04/02/22   Nida, Denman George, MD  Vitamin D, Ergocalciferol, (DRISDOL) 1.25 MG (50000 UNIT) CAPS capsule TAKE 1 CAPSULE BY MOUTH EVERY 7 DAYS. 04/30/22   Anabel Halon, MD    Family History Family History  Problem Relation Age of Onset   Lupus Mother    Cancer Paternal Grandfather    Atrial fibrillation Maternal Grandmother     Social History Social History   Tobacco Use   Smoking status: Former    Packs/day: 0.01    Years: 4.00    Total pack years: 0.04    Types: Cigars, Cigarettes   Smokeless tobacco: Never  Vaping Use   Vaping Use: Never used  Substance Use Topics   Alcohol use: No   Drug use: No     Allergies   Patient has no  known allergies.   Review of Systems Review of Systems Per HPI  Physical Exam Triage Vital Signs ED Triage Vitals  Enc Vitals Group     BP 05/07/22 1317 127/76     Pulse Rate 05/07/22 1317 84     Resp 05/07/22 1317 18     Temp 05/07/22 1317 98.2 F (36.8 C)     Temp Source 05/07/22 1317 Oral     SpO2 05/07/22 1317 98 %     Weight --      Height --      Head Circumference --      Peak Flow --      Pain Score 05/07/22 1318 8     Pain Loc --      Pain Edu? --      Excl. in GC? --    No data found.  Updated Vital Signs BP 127/76 (BP Location: Right Arm)   Pulse 84   Temp 98.2 F (36.8 C) (Oral)   Resp 18   LMP 04/23/2022 (Exact Date)   SpO2 98%   Visual Acuity Right Eye Distance:   Left Eye Distance:   Bilateral Distance:    Right Eye Near:   Left Eye Near:    Bilateral Near:     Physical Exam Vitals and nursing note reviewed.  Constitutional:      Appearance: Normal appearance.  HENT:     Head: Atraumatic.     Right Ear: Tympanic membrane and external ear normal.     Left Ear: Tympanic membrane and external ear normal.     Nose: Rhinorrhea present.     Mouth/Throat:     Mouth: Mucous membranes are moist.     Pharynx: Posterior oropharyngeal erythema present.  Eyes:     Extraocular Movements: Extraocular movements intact.     Conjunctiva/sclera: Conjunctivae normal.  Cardiovascular:     Rate and Rhythm: Normal rate and regular rhythm.     Heart sounds: Normal heart sounds.  Pulmonary:     Effort: Pulmonary effort is normal.     Breath sounds: Normal breath sounds. No wheezing or rales.  Musculoskeletal:        General: Normal range of motion.     Cervical back: Normal range of motion and neck supple.  Skin:    General: Skin is warm and dry.  Neurological:     Mental Status: She is alert and oriented to person, place, and time.  Psychiatric:        Mood and Affect: Mood normal.        Thought Content: Thought  content normal.      UC  Treatments / Results  Labs (all labs ordered are listed, but only abnormal results are displayed) Labs Reviewed  RESP PANEL BY RT-PCR (FLU A&B, COVID) [id number]    EKG   Radiology No results found.  Procedures Procedures (including critical care time)  Medications Ordered in UC Medications - No data to display  Initial Impression / Assessment and Plan / UC Course  I have reviewed the triage vital signs and the nursing notes.  Pertinent labs & imaging results that were available during my care of the patient were reviewed by me and considered in my medical decision making (see chart for details).     Vital signs and exam overall reassuring today and suggestive of a viral upper respiratory infection.  Respiratory panel pending, treat with Phenergan DM, Flonase, supportive over-the-counter medications and home care.  Return for worsening symptoms.  Work note given.  Final Clinical Impressions(s) / UC Diagnoses   Final diagnoses:  Viral URI with cough   Discharge Instructions   None    ED Prescriptions     Medication Sig Dispense Auth. Provider   fluticasone (FLONASE) 50 MCG/ACT nasal spray Place 1 spray into both nostrils 2 (two) times daily. 16 g Roosvelt Maser Sun[place], [place]   promethazine-dextromethorphan (PROMETHAZINE-DM) 6.25-15 MG/5ML syrup Take 5 mLs by mouth 4 (four) times daily as needed. 100 mL Particia Nearing, [place]      PDMP not reviewed this encounter.   Particia Nearing, [place] 05/07/22 1350

## 2022-05-07 NOTE — ED Triage Notes (Signed)
Left ear pain when swallowing.  Sore throat, body aches, fatigue, headache and diarrhea since Tuesday.

## 2022-06-02 ENCOUNTER — Ambulatory Visit (HOSPITAL_COMMUNITY)
Admission: RE | Admit: 2022-06-02 | Discharge: 2022-06-02 | Disposition: A | Discharge status: Home / Self Care | Payer: Medicaid Other | Source: Ambulatory Visit | Attending: Internal Medicine | Admitting: Internal Medicine

## 2022-06-02 ENCOUNTER — Ambulatory Visit (INDEPENDENT_AMBULATORY_CARE_PROVIDER_SITE_OTHER): Payer: Medicaid Other | Admitting: Internal Medicine

## 2022-06-02 ENCOUNTER — Encounter: Payer: Self-pay | Admitting: Internal Medicine

## 2022-06-02 VITALS — BP 119/80 | HR 82 | Ht 64.0 in | Wt 281.1 lb

## 2022-06-02 DIAGNOSIS — M25561 Pain in right knee: Secondary | ICD-10-CM

## 2022-06-02 MED ORDER — MELOXICAM 7.5 MG PO TABS: 7.5000 mg | ORAL_TABLET | Freq: Every day | ORAL | 0 refills | Status: DC

## 2022-06-02 NOTE — Progress Notes (Unsigned)
     HPI:[name] is a 32 y.o. female who presents for evaluation of right knee pain. For the details of today's visit, please refer to the assessment and plan.   Past Medical History:  Diagnosis Date   BV (bacterial vaginosis) 11/12/2021   11/12/21 rx flagyl   Encounter for tubal ligation 09/16/2017   Gestational diabetes    metformin   Graves disease    Maternal varicella, non-immune 02/18/2017   Immunize p delivery   Medical history non-contributory    Supervision of high risk pregnancy, antepartum 02/17/2017    [place] Init[facility] Care at  [place] Dating By LMP c/w 1st trimester U/S 7wk Pap 05/23/15 neg [facility] (got records) GC/CT Initial:   -/-             36+wks:-/- Genetic Screen NT/IT: neg CF screen declined Anatomic [place] Normal female Flu vaccine 02/17/17  Tdap Recommended ~ 28wks Glucose Screen  2 hr   99/122/114  GDM GBS pos Feed Preference both Contraception BTL, consent 02/06/1   SVD (spontaneous vaginal delivery) 09/15/2017     Physical Exam: Vitals:   06/02/22 1620  BP: 119/80  Pulse: 82  SpO2: 97%  Weight: 281 lb 1.3 oz (127.5 kg)  Height: 5\' 4"  (1.626 m)    General: NAD at rest, antalgic gait when walking Right Knee: - Inspection: effusion. No erythema or bruising. Skin intact - Palpation: TTP on anterior joint line - ROM: Limited extension in knee - Strength: 5/5 strength - Neuro/vasc: NV intact - Special Tests: - LIGAMENTS: Pain limiting , but no laxity in knee   Assessment & Plan:   Acute pain of right knee Patient presents for evaluation of right knee pain. Pain started on 12/28 and patient does not recall any twisting of knee or trauma to knee. It worsened on 12/31. Pain is worse with extending knee and walking. Knee has not been hot or red. No history of blood clots or recent travel. No history of knee injury. No other painful or swollen joints.   Assessment/Plan: Undiagnosed new problem with uncertain prognosis. Effusion on  ultrasound. Will send for xray to evaluate for osteochondral injury.  - meloxicam (MOBIC) 7.5 MG tablet; Take 1 tablet (7.5 mg total) by mouth daily.  Dispense: 10 tablet; Refill: 0 - Follow up in one week.     Lorene Dy, MD

## 2022-06-02 NOTE — Patient Instructions (Signed)
Thank you for trusting me with your care. To recap, today we discussed the following:   Acute pain of right knee - DG Knee Complete 4 Views Right; Future - meloxicam (MOBIC) 7.5 MG tablet; Take 1 tablet (7.5 mg total) by mouth daily.  Dispense: 10 tablet; Refill: 0

## 2022-06-04 DIAGNOSIS — M25561 Pain in right knee: Secondary | ICD-10-CM | POA: Insufficient documentation

## 2022-06-04 NOTE — Assessment & Plan Note (Signed)
Patient presents for evaluation of right knee pain. Pain started on 12/28 and patient does not recall any twisting of knee or trauma to knee. It worsened on 12/31. Pain is worse with extending knee and walking. Knee has not been hot or red. No history of blood clots or recent travel. No history of knee injury. No other painful or swollen joints.   Assessment/Plan: Undiagnosed new problem with uncertain prognosis. Effusion on ultrasound. Will send for xray to evaluate for osteochondral injury.  - meloxicam (MOBIC) 7.5 MG tablet; Take 1 tablet (7.5 mg total) by mouth daily.  Dispense: 10 tablet; Refill: 0 - Follow up in one week.

## 2022-06-09 ENCOUNTER — Ambulatory Visit: Payer: Medicaid Other | Admitting: Internal Medicine

## 2022-06-11 ENCOUNTER — Ambulatory Visit: Payer: Medicaid Other | Admitting: Family Medicine

## 2022-06-11 ENCOUNTER — Encounter: Payer: Self-pay | Admitting: Family Medicine

## 2022-06-11 VITALS — BP 134/85 | HR 86 | Ht 64.0 in | Wt 288.0 lb

## 2022-06-11 DIAGNOSIS — M25561 Pain in right knee: Secondary | ICD-10-CM | POA: Diagnosis not present

## 2022-06-11 MED ORDER — MELOXICAM 15 MG PO TABS: 15.0000 mg | ORAL_TABLET | Freq: Every day | ORAL | 0 refills | Status: DC

## 2022-06-11 NOTE — Patient Instructions (Addendum)
Please take medications as prescribed. If symptoms get worse please contact your health care provider.

## 2022-06-11 NOTE — Progress Notes (Signed)
   Acute Office Visit  Subjective:     Patient ID: Linda Skinner, female    DOB: January 22, 1991, 32 y.o.   MRN: [id number]  Chief Complaint  Patient presents with   Follow-up   Knee Pain    Patient complains of continued R knee pain. States it feels tight when she extends her knee.    [name] 32 year old female, presents to the clinic for follow up of right knee pain. Effusion see on ultrasound, xray showed no fracture,dislocation no evidence of arthropathy. Patient stated Meloxicam medication provides mild relief. Knee Pain  The incident occurred more than 1 week ago. There was no injury mechanism. The pain is present in the right knee. The quality of the pain is described as stabbing and aching. The pain is at a severity of 6/10. The pain is moderate. The pain has been Fluctuating since onset. Pertinent negatives include no inability to bear weight, loss of motion, loss of sensation, muscle weakness, numbness or tingling. She reports no foreign bodies present. The symptoms are aggravated by movement. She has tried rest and NSAIDs for the symptoms. The treatment provided mild relief.   Review of Systems  Constitutional:  Negative for chills and fever.  Respiratory:  Negative for shortness of breath.   Cardiovascular:  Negative for chest pain and palpitations.  Musculoskeletal:  Positive for joint pain.  Neurological:  Negative for dizziness, tingling, numbness and headaches.        Objective:    BP 134/85   Pulse 86   Ht 5\' 4"  (1.626 m)   Wt 288 lb (130.6 kg)   SpO2 96%   BMI 49.44 kg/m  BP Readings from Last 3 Encounters:  06/11/22 134/85  06/02/22 119/80  05/07/22 127/76      Physical Exam Cardiovascular:     Rate and Rhythm: Normal rate and regular rhythm.     Pulses: Normal pulses.  Pulmonary:     Effort: Pulmonary effort is normal.     Breath sounds: Normal breath sounds.  Musculoskeletal:        General: Swelling present. No tenderness, deformity or  signs of injury. Normal range of motion.     Right lower leg: No edema.     Left lower leg: No edema.  Skin:    General: Skin is warm and dry.     Capillary Refill: Capillary refill takes less than 2 seconds.  Psychiatric:        Mood and Affect: Mood normal.     No results found for any visits on 06/11/22.      Assessment & Plan:   Problem List Items Addressed This Visit       Other   Acute pain of right knee    Follow up for right acute pain, xray negative  Patient has minor swelling on the right knee. Non tender and normal ROM  Increase dosage of Meloxicam 15 mg      Other Visit Diagnoses     Right knee pain, unspecified chronicity    -  Primary   Relevant Medications   meloxicam (MOBIC) 15 MG tablet       Meds ordered this encounter  Medications   meloxicam (MOBIC) 15 MG tablet    Sig: Take 1 tablet (15 mg total) by mouth daily.    Dispense:  30 tablet    Refill:  0    No follow-ups on file.  Renard Hamper Ria Comment, FNP

## 2022-06-12 NOTE — Assessment & Plan Note (Addendum)
Follow up for right acute pain, xray negative  Patient has minor swelling on the right knee. Non tender and normal ROM  Increase dosage of Meloxicam 15 mg

## 2022-06-18 IMAGING — NM NM RAI THERAPY FOR HYPERTHYROIDISM
1 series · 1 of 1 positions shown · non-contrast
Comparison: None.

CLINICAL DATA: Hyperthyroidism

EXAM:
RADIOACTIVE IODINE THERAPY FOR HYPERTHYROIDISM
TECHNIQUE: Radioactive iodine prescribed by myself. The risks and benefits of
radioactive iodine therapy were discussed with the patient in detail
by Dr. Don Lolito. Alternative therapies were also mentioned. Radiation
safety was discussed with the patient, including how to protect the
general public from exposure. There were no barriers to
communication. Written consent was obtained. The patient then
received a capsule containing the radiopharmaceutical.
The patient will follow-up with the referring physician.
RADIOPHARMACEUTICALS:  13.7 mCi G-UJU sodium iodide orally

[Series 1: bone statics · 2.07mm/px · 1 of 1 slices shown]
[im 1/1]
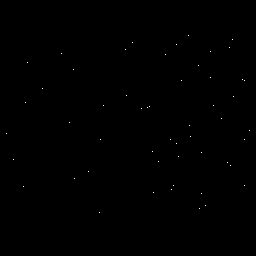

[1 of 1 positions shown; findings below may reference images not displayed]

IMPRESSION: Per oral administration of G-UJU sodium iodide for the treatment of
hyperthyroidism.

## 2022-07-16 ENCOUNTER — Ambulatory Visit: Payer: Medicaid Other | Admitting: Internal Medicine

## 2022-07-16 ENCOUNTER — Encounter: Payer: Self-pay | Admitting: Internal Medicine

## 2022-07-16 VITALS — BP 121/83 | HR 87 | Ht 64.0 in | Wt 285.4 lb

## 2022-07-16 DIAGNOSIS — F331 Major depressive disorder, recurrent, moderate: Secondary | ICD-10-CM

## 2022-07-16 DIAGNOSIS — M25561 Pain in right knee: Secondary | ICD-10-CM

## 2022-07-16 DIAGNOSIS — E890 Postprocedural hypothyroidism: Secondary | ICD-10-CM

## 2022-07-16 MED ORDER — MELOXICAM 15 MG PO TABS: 15.0000 mg | ORAL_TABLET | Freq: Every day | ORAL | 3 refills | Status: DC

## 2022-07-16 MED ORDER — BUPROPION HCL ER (SR) 100 MG PO TB12: 100.0000 mg | ORAL_TABLET | Freq: Two times a day (BID) | ORAL | 5 refills | Status: DC

## 2022-07-16 NOTE — Progress Notes (Signed)
Established Patient Office Visit  Subjective:  Patient ID: Linda Skinner, female    DOB: 1990/08/17  Age: 32 y.o. MRN: [id number]  CC:  Chief Complaint  Patient presents with   Depression    HPI Linda Skinner is a 32 y.o. female with past medical history of Grave's disease and major depressive disorder who presents for f/u of her chronic medical conditions.  Hypothyroidism: She had radioactive iodine therapy for Graves' disease in 04/2020.  She is on Levothyroxine 25 mcg QD.  Depression: She has been taking Wellbutrin 150 mg daily for depression.  She still has anhedonia, insomnia and lack of interest in her routine activities.  She has been stressed due to her grandmother's cancer diagnosis. She was nervous around her mother's death anniversary. She has been feeling fatigue and hopeless as well. She has increased appetite and feeling sluggish. Although she reports that she has felt better with Wellbutrin than Lexapro. 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 Denies any SI or HI currently.  Morbid obesity: She has been struggling to lose weight.  She admits that she needs to improve her diet.  She is willing to try DASH diet. She has been struggling to lose weight.  She agrees to strictly follow low-carb diet for now.  She has been having right middle pain, which is constant, sharp, with knee swelling.  She has had relief with Mobic.  Denies any recent injury.   Past Medical History:  Diagnosis Date   BV (bacterial vaginosis) 11/12/2021   11/12/21 rx flagyl   Encounter for tubal ligation 09/16/2017   Gestational diabetes    metformin   Graves disease    Maternal varicella, non-immune 02/18/2017   Immunize p delivery   Medical history non-contributory    Supervision of high risk pregnancy, antepartum 02/17/2017    [facility] at  [place] Dating By LMP c/w 1st trimester U/S 7wk  Pap 05/23/15 neg [facility]D (got records) GC/CT Initial:   -/-             36+wks:-/- Genetic Screen NT/IT: neg CF screen declined Anatomic [place] Normal female Flu vaccine 02/17/17  Tdap Recommended ~ 28wks Glucose Screen  2 hr   99/122/114  GDM GBS pos Feed Preference both Contraception BTL, consent 02/06/1   SVD (spontaneous vaginal delivery) 09/15/2017    Past Surgical History:  Procedure Laterality Date   MOUTH SURGERY     TUBAL LIGATION N/A 09/15/2017   Procedure: POST PARTUM TUBAL LIGATION;  Surgeon: Gwynne Edinger, MD;  Location: [facility];  Service: Gynecology;  Laterality: N/A;   WISDOM TOOTH EXTRACTION      Family History  Problem Relation Age of Onset   Lupus Mother    Cancer Paternal Grandfather    Atrial fibrillation Maternal Grandmother     Social History   Socioeconomic History   Marital status: Single    Spouse name: Not on file   Number of children: Not on file   Years of education: Not on file   Highest education level: Not on file  Occupational History   Not on file  Tobacco Use   Smoking status: Former    Packs/day: 0.01    Years: 4.00    Total pack years: 0.04    Types: Cigars, Cigarettes   Smokeless tobacco: Never  Vaping Use   Vaping Use: Never used  Substance and Sexual Activity  Alcohol use: No   Drug use: No   Sexual activity: Not Currently    Birth control/protection: Surgical    Comment: tubal  Other Topics Concern   Not on file  Social History Narrative   Not on file   Social Determinants of Health   Financial Resource Strain: Low Risk  (05/01/2020)   Overall Financial Resource Strain (CARDIA)    Difficulty of Paying Living Expenses: Not hard at all  Food Insecurity: No Food Insecurity (05/01/2020)   Hunger Vital Sign    Worried About Running Out of Food in the Last Year: Never true    Ran Out of Food in the Last Year: Never true  Transportation Needs: No Transportation Needs (05/01/2020)   PRAPARE - Radiographer, therapeutic (Medical): No    Lack of Transportation (Non-Medical): No  Physical Activity: Insufficiently Active (05/01/2020)   Exercise Vital Sign    Days of Exercise per Week: 3 days    Minutes of Exercise per Session: 20 min  Stress: Stress Concern Present (05/01/2020)   [place]    Feeling of Stress : To some extent  Social Connections: Socially Isolated (05/01/2020)   Social Connection and Isolation Panel [NHANES]    Frequency of Communication with Friends and Family: Twice a week    Frequency of Social Gatherings with Friends and Family: Twice a week    Attends Religious Services: Never    Marine scientist or Organizations: No    Attends Archivist Meetings: Never    Marital Status: Never married  Intimate Partner Violence: Not At Risk (05/01/2020)   Humiliation, Afraid, Rape, and Kick questionnaire    Fear of Current or Ex-Partner: No    Emotionally Abused: No    Physically Abused: No    Sexually Abused: No    Outpatient Medications Prior to Visit  Medication Sig Dispense Refill   fluconazole (DIFLUCAN) 150 MG tablet Take 1 now and repeat 1 in 3 days if needed 2 tablet 1   fluticasone (FLONASE) 50 MCG/ACT nasal spray Place 1 spray into both nostrils 2 (two) times daily. 16 g 2   levothyroxine (SYNTHROID) 25 MCG tablet Take 1 tablet (25 mcg total) by mouth daily before breakfast. 90 tablet 1   promethazine-dextromethorphan (PROMETHAZINE-DM) 6.25-15 MG/5ML syrup Take 5 mLs by mouth 4 (four) times daily as needed. 100 mL 0   Vitamin D, Ergocalciferol, (DRISDOL) 1.25 MG (50000 UNIT) CAPS capsule TAKE 1 CAPSULE BY MOUTH EVERY 7 DAYS. 12 capsule 0   buPROPion (WELLBUTRIN XL) 150 MG 24 hr tablet Take 1 tablet (150 mg total) by mouth daily. 30 tablet 5   meloxicam (MOBIC) 15 MG tablet Take 1 tablet (15 mg total) by mouth daily. 30 tablet 0   No facility-administered medications prior to visit.    No  Known Allergies  ROS Review of Systems  Constitutional:  Negative for chills and fever.  HENT:  Negative for congestion, sinus pressure, sinus pain and sore throat.   Eyes:  Negative for pain and discharge.  Respiratory:  Negative for cough and shortness of breath.   Cardiovascular:  Negative for chest pain and palpitations.  Gastrointestinal:  Negative for abdominal pain, constipation, diarrhea, nausea and vomiting.  Endocrine: Negative for polydipsia and polyuria.  Genitourinary:  Negative for dysuria and hematuria.  Musculoskeletal:  Positive for arthralgias (R knee). Negative for neck pain and neck stiffness.  Skin:  Negative for rash.  Neurological:  Negative for dizziness and weakness.  Psychiatric/Behavioral:  Positive for decreased concentration, dysphoric mood and sleep disturbance. Negative for agitation and suicidal ideas. The patient is nervous/anxious.       Objective:    Physical Exam Vitals reviewed.  Constitutional:      General: She is not in acute distress.    Appearance: She is obese. She is not diaphoretic.  HENT:     Head: Normocephalic and atraumatic.     Nose: Nose normal.     Mouth/Throat:     Mouth: Mucous membranes are moist.  Eyes:     General: No scleral icterus.    Extraocular Movements: Extraocular movements intact.  Cardiovascular:     Rate and Rhythm: Normal rate and regular rhythm.     Pulses: Normal pulses.     Heart sounds: No murmur heard. Pulmonary:     Breath sounds: Normal breath sounds. No wheezing or rales.  Musculoskeletal:     Cervical back: Normal range of motion and neck supple. No rigidity or tenderness.     Right lower leg: No edema.     Left lower leg: No edema.  Skin:    General: Skin is warm.     Coloration: Skin is not pale.  Neurological:     General: No focal deficit present.     Mental Status: She is alert and oriented to person, place, and time.     Sensory: No sensory deficit.     Motor: No weakness.   Psychiatric:        Mood and Affect: Mood normal.        Behavior: Behavior normal.     BP 121/83 (BP Location: Left Arm, Patient Position: Sitting, Cuff Size: Large)   Pulse 87   Ht 5' 4"$  (1.626 m)   Wt 285 lb 6.4 oz (129.5 kg)   SpO2 97%   BMI 48.99 kg/m  Wt Readings from Last 3 Encounters:  07/16/22 285 lb 6.4 oz (129.5 kg)  06/11/22 288 lb (130.6 kg)  06/02/22 281 lb 1.3 oz (127.5 kg)    Lab Results  Component Value Date   TSH 0.070 (L) 03/26/2022   Lab Results  Component Value Date   WBC 5.1 10/08/2021   HGB 10.0 (L) 10/08/2021   HCT 32.2 (L) 10/08/2021   MCV 77 (L) 10/08/2021   PLT 294 10/08/2021   Lab Results  Component Value Date   NA 138 12/25/2021   K 4.5 12/25/2021   CO2 23 12/25/2021   GLUCOSE 99 12/25/2021   BUN 8 12/25/2021   CREATININE 0.70 12/25/2021   BILITOT 0.2 12/25/2021   ALKPHOS 50 12/25/2021   AST 15 12/25/2021   ALT 13 12/25/2021   PROT 7.3 12/25/2021   ALBUMIN 4.2 12/25/2021   CALCIUM 9.5 12/25/2021   ANIONGAP 9 05/05/2018   EGFR 119 12/25/2021   Lab Results  Component Value Date   CHOL 159 12/25/2021   Lab Results  Component Value Date   HDL 55 12/25/2021   Lab Results  Component Value Date   LDLCALC 88 12/25/2021   Lab Results  Component Value Date   TRIG 83 12/25/2021   Lab Results  Component Value Date   CHOLHDL 2.9 12/25/2021   Lab Results  Component Value Date   HGBA1C 5.6 04/02/2022      Assessment & Plan:   Problem List Items Addressed This Visit       Endocrine   Hypothyroidism    Lab Results  Component Value Date   TSH 0.070 (L) 03/26/2022  S/p radioactive ablation On Levothyroxine 25 mcg QD Followed by Endocrinology        Other   Depression, major, recurrent, moderate ([place]) - Primary       07/16/2022    4:10 PM 06/11/2022    1:47 PM 06/02/2022    4:21 PM  PHQ9 SCORE ONLY  PHQ-9 Total Score 18 16 18  $ Uncontrolled Some symptoms of mood changes would be because of hypothyroidism Was  on Lexapro 20 mg QD Had added Wellbutrin 150 mg QD, symptoms had improved -  adjusted dose to 100 mg BID If persistent, will add Elavil or Abilify      Relevant Medications   buPROPion ER (WELLBUTRIN SR) 100 MG 12 hr tablet   Morbid obesity ([place])    Emphasized on diet modification and moderate exercise Had lengthy discussion about healthy diet She would be a good candidate for GLP-1 agonist therapy, but cost is a limiting factor      Right knee pain    Chronic knee pain X-ray of knee showed effusion Continue Mobic for now Knee brace as needed Avoid prolonged standing Needs to lose weight for better mobility      Relevant Medications   meloxicam (MOBIC) 15 MG tablet    Meds ordered this encounter  Medications   meloxicam (MOBIC) 15 MG tablet    Sig: Take 1 tablet (15 mg total) by mouth daily.    Dispense:  30 tablet    Refill:  3   buPROPion ER (WELLBUTRIN SR) 100 MG 12 hr tablet    Sig: Take 1 tablet (100 mg total) by mouth 2 (two) times daily.    Dispense:  60 tablet    Refill:  5    Follow-up: Return in about 3 months (around 10/14/2022) for Annual physical.    Lindell Spar, MD

## 2022-07-16 NOTE — Patient Instructions (Signed)
Please start taking Wellbutrin 100 mg twice daily.  Please continue to take medications as prescribed.  Please follow DASH diet and perform moderate exercise/walking at least 150 mins/week.

## 2022-07-16 NOTE — Assessment & Plan Note (Addendum)
Emphasized on diet modification and moderate exercise Had lengthy discussion about healthy diet She would be a good candidate for GLP-1 agonist therapy, but cost is a limiting factor

## 2022-07-16 NOTE — Assessment & Plan Note (Signed)
Lab Results  Component Value Date   TSH 0.070 (L) 03/26/2022   S/p radioactive ablation On Levothyroxine 25 mcg QD Followed by Endocrinology

## 2022-07-16 NOTE — Assessment & Plan Note (Addendum)
    07/16/2022    4:10 PM 06/11/2022    1:47 PM 06/02/2022    4:21 PM  PHQ9 SCORE ONLY  PHQ-9 Total Score 18 16 18    Uncontrolled Some symptoms of mood changes would be because of hypothyroidism Was on Lexapro 20 mg QD Had added Wellbutrin 150 mg QD, symptoms had improved -  adjusted dose to 100 mg BID If persistent, will add Elavil or Abilify

## 2022-07-16 NOTE — Assessment & Plan Note (Signed)
Chronic knee pain X-ray of knee showed effusion Continue Mobic for now Knee brace as needed Avoid prolonged standing Needs to lose weight for better mobility

## 2022-07-29 LAB — LIPID PANEL
Chol/HDL Ratio: 3 ratio (ref 0.0–4.4)
Cholesterol, Total: 155 mg/dL (ref 100–199)
HDL: 51 mg/dL (ref >39)
LDL Chol Calc (NIH): 88 mg/dL (ref 0–99)
Triglycerides: 86 mg/dL (ref 0–149)
VLDL Cholesterol Cal: 16 mg/dL (ref 5–40)

## 2022-07-29 LAB — T4, FREE: Free T4: 1.11 ng/dL (ref 0.82–1.77)

## 2022-07-29 LAB — VITAMIN D 25 HYDROXY (VIT D DEFICIENCY, FRACTURES): Vit D, 25-Hydroxy: 55.1 ng/mL (ref 30.0–100.0)

## 2022-07-29 LAB — TSH: TSH: 1.8 u[IU]/mL (ref 0.450–4.500)

## 2022-08-03 ENCOUNTER — Encounter: Payer: Self-pay | Admitting: "Endocrinology

## 2022-08-03 ENCOUNTER — Ambulatory Visit: Payer: Medicaid Other | Admitting: "Endocrinology

## 2022-08-03 VITALS — BP 120/74 | HR 76 | Ht 64.0 in | Wt 282.0 lb

## 2022-08-03 DIAGNOSIS — E0500 Thyrotoxicosis with diffuse goiter without thyrotoxic crisis or storm: Secondary | ICD-10-CM

## 2022-08-03 DIAGNOSIS — R7303 Prediabetes: Secondary | ICD-10-CM | POA: Diagnosis not present

## 2022-08-03 DIAGNOSIS — E890 Postprocedural hypothyroidism: Secondary | ICD-10-CM

## 2022-08-03 DIAGNOSIS — E559 Vitamin D deficiency, unspecified: Secondary | ICD-10-CM | POA: Diagnosis not present

## 2022-08-03 LAB — POCT GLYCOSYLATED HEMOGLOBIN (HGB A1C): HbA1c, POC (prediabetic range): 5.7 % (ref 5.7–6.4)

## 2022-08-03 MED ORDER — LEVOTHYROXINE SODIUM 25 MCG PO TABS: 25.0000 ug | ORAL_TABLET | Freq: Every day | ORAL | 1 refills | Status: DC

## 2022-08-03 NOTE — Progress Notes (Signed)
08/03/2022    Endocrinology follow-up note  Subjective:    Patient ID: Linda Skinner, female    DOB: [birth year], PCP Lindell Spar, MD.   Past Medical History:  Diagnosis Date   BV (bacterial vaginosis) 11/12/2021   11/12/21 rx flagyl   Encounter for tubal ligation 09/16/2017   Gestational diabetes    metformin   Graves disease    Maternal varicella, non-immune 02/18/2017   Immunize p delivery   Medical history non-contributory    Supervision of high risk pregnancy, antepartum 02/17/2017    [place] Initiated Care at  [facility] Dating By LMP c/w 1st trimester U/S 7wk Pap 05/23/15 neg [facility]D (got records) GC/CT Initial:   -/-             36+wks:-/- Genetic Screen NT/IT: neg CF screen declined Anatomic [place] Normal female Flu vaccine 02/17/17  Tdap Recommended ~ 28wks Glucose Screen  2 hr   99/122/114  GDM GBS pos Feed Preference both Contraception BTL, consent 02/06/1   SVD (spontaneous vaginal delivery) 09/15/2017    Past Surgical History:  Procedure Laterality Date   MOUTH SURGERY     TUBAL LIGATION N/A 09/15/2017   Procedure: POST PARTUM TUBAL LIGATION;  Surgeon: Gwynne Edinger, MD;  Location: [place];  Service: Gynecology;  Laterality: N/A;   WISDOM TOOTH EXTRACTION      Social History   Socioeconomic History   Marital status: Single    Spouse name: Not on file   Number of children: Not on file   Years of education: Not on file   Highest education level: Not on file  Occupational History   Not on file  Tobacco Use   Smoking status: Former    Packs/day: 0.01    Years: 4.00    Total pack years: 0.04    Types: Cigars, Cigarettes   Smokeless tobacco: Never  Vaping Use   Vaping Use: Never used  Substance and Sexual Activity   Alcohol use: No   Drug use: No   Sexual activity: Not Currently    Birth control/protection: Surgical    Comment: tubal  Other Topics Concern   Not on file  Social History Narrative   Not on file    Social Determinants of Health   Financial Resource Strain: Low Risk  (05/01/2020)   Overall Financial Resource Strain (CARDIA)    Difficulty of Paying Living Expenses: Not hard at all  Food Insecurity: No Food Insecurity (05/01/2020)   Hunger Vital Sign    Worried About Running Out of Food in the Last Year: Never true    Ran Out of Food in the Last Year: Never true  Transportation Needs: No Transportation Needs (05/01/2020)   PRAPARE - Hydrologist (Medical): No    Lack of Transportation (Non-Medical): No  Physical Activity: Insufficiently Active (05/01/2020)   Exercise Vital Sign    Days of Exercise per Week: 3 days    Minutes of Exercise per Session: 20 min  Stress: Stress Concern Present (05/01/2020)   [place]    Feeling of Stress : To some extent  Social Connections: Socially Isolated (05/01/2020)   Social Connection and Isolation Panel [NHANES]    Frequency of Communication with Friends and Family: Twice a week    Frequency of Social Gatherings with Friends and Family: Twice a week    Attends Religious Services: Never  Active Member of Clubs or Organizations: No    Attends Archivist Meetings: Never    Marital Status: Never married    Family History  Problem Relation Age of Onset   Lupus Mother    Cancer Paternal Grandfather    Atrial fibrillation Maternal Grandmother     Outpatient Encounter Medications as of 08/03/2022  Medication Sig   buPROPion ER (WELLBUTRIN SR) 100 MG 12 hr tablet Take 1 tablet (100 mg total) by mouth 2 (two) times daily.   fluconazole (DIFLUCAN) 150 MG tablet Take 1 now and repeat 1 in 3 days if needed   fluticasone (FLONASE) 50 MCG/ACT nasal spray Place 1 spray into both nostrils 2 (two) times daily.   levothyroxine (SYNTHROID) 25 MCG tablet Take 1 tablet (25 mcg total) by mouth daily before breakfast.   meloxicam (MOBIC) 15 MG tablet Take 1  tablet (15 mg total) by mouth daily.   promethazine-dextromethorphan (PROMETHAZINE-DM) 6.25-15 MG/5ML syrup Take 5 mLs by mouth 4 (four) times daily as needed.   [DISCONTINUED] levothyroxine (SYNTHROID) 25 MCG tablet Take 1 tablet (25 mcg total) by mouth daily before breakfast.   [DISCONTINUED] Vitamin D, Ergocalciferol, (DRISDOL) 1.25 MG (50000 UNIT) CAPS capsule TAKE 1 CAPSULE BY MOUTH EVERY 7 DAYS.   No facility-administered encounter medications on file as of 08/03/2022.    ALLERGIES: No Known Allergies  VACCINATION STATUS: Immunization History  Administered Date(s) Administered   Influenza,inj,Quad PF,6+ Mos 02/17/2017, 03/01/2020, 04/15/2021   Influenza-Unspecified 03/01/2022   PFIZER(Purple Top)SARS-COV-2 Vaccination 02/07/2020, 03/01/2020   PPD Test 03/11/2020, 03/25/2020   Tdap 09/16/2017     HPI  Linda Skinner is 32 y.o. female who presents today with her new labs and uptake and scan of her thyroid.    she was recently seen in consultation for hyperthyroidism requested by Lindell Spar, MD. 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  She is currently on levothyroxine 25 mcg p.o. daily before breakfast.  Her previsit labs are consistent with appropriate replacement.    She reports compliance with medication.  She has no new complaints today, presents with steady weight since last visit.  She maintained the changes she made on her lifestyle.     She denies palpitations, tremor, nor heat intolerance at this time.  she denies family history of thyroid dysfunction. -She denies family hx of thyroid cancer. she denies personal history of goiter.she  is willing to proceed with appropriate work up and therapy for thyrotoxicosis.                           Review of  systems  Constitutional: + Weight gain, + fatigue.      Objective:    BP 120/74   Pulse 76   Ht '5\' 4"'$  (1.626 m)   Wt 282 lb (127.9 kg)   BMI 48.41 kg/m   Wt Readings from Last 3 Encounters:  08/03/22 282 lb (127.9 kg)  07/16/22 285 lb 6.4 oz (129.5 kg)  06/11/22 288 lb (130.6 kg)                                                Physical exam  Constitutional:  Body mass index is 48.41 kg/m., not in acute distress, + normal state of mind   CMP     Component Value Date/Time   NA 138 12/25/2021 0824   K 4.5 12/25/2021 0824   CL 103 12/25/2021 0824   CO2 23 12/25/2021 0824   GLUCOSE 99 12/25/2021 0824   GLUCOSE 108 (H) 05/05/2018 0750   BUN 8 12/25/2021 0824   CREATININE 0.70 12/25/2021 0824   CALCIUM 9.5 12/25/2021 0824   PROT 7.3 12/25/2021 0824   ALBUMIN 4.2 12/25/2021 0824   AST 15 12/25/2021 0824   ALT 13 12/25/2021 0824   ALKPHOS 50 12/25/2021 0824   BILITOT 0.2 12/25/2021 0824   GFRNONAA 118 03/08/2020 0919   GFRAA 136 03/08/2020 0919     CBC    Component Value Date/Time   WBC 5.1 10/08/2021 0851   WBC 10.3 05/05/2018 0750   RBC 4.16 10/08/2021 0851   RBC 4.48 05/05/2018 0750   HGB 10.0 (L) 10/08/2021 0851   HCT 32.2 (L) 10/08/2021 0851   PLT 294 10/08/2021 0851   MCV 77 (L) 10/08/2021 0851   MCH 24.0 (L) 10/08/2021 0851   MCH 25.4 (L) 05/05/2018 0750   MCHC 31.1 (L) 10/08/2021 0851   MCHC 31.8 05/05/2018 0750   RDW 15.6 (H) 10/08/2021 0851   LYMPHSABS 2.4 10/08/2021 0851   MONOABS 0.9 05/05/2018 0750   EOSABS 0.1 10/08/2021 0851   BASOSABS 0.0 10/08/2021 0851    Diabetic Labs (most recent): Lab Results  Component Value Date   HGBA1C 5.7 08/03/2022   HGBA1C 5.6 04/02/2022   HGBA1C 5.9 (H) 10/08/2021   MICROALBUR 13.7 (H) 03/13/2020    Lipid Panel     Component Value Date/Time   CHOL 155 07/28/2022 0802   TRIG 86 07/28/2022 0802   HDL 51 07/28/2022 0802   CHOLHDL 3.0 07/28/2022 0802   LDLCALC 88 07/28/2022 0802   LABVLDL 16  07/28/2022 0802     Lab Results  Component Value Date   TSH 1.800 07/28/2022   TSH 0.070 (L) 03/26/2022   TSH 0.028 (L) 12/25/2021   TSH 0.022 (L) 10/08/2021   TSH 0.047 (L) 09/22/2021   TSH <0.005 (L) 06/24/2021   TSH 1.660 02/21/2021   TSH 11.900 (H) 01/13/2021   TSH <0.005 (L) 03/08/2020   TSH <0.005 (L) 03/08/2020   FREET4 1.11 07/28/2022   FREET4 1.39 03/26/2022   FREET4 1.33 12/25/2021   FREET4 1.55 10/08/2021   FREET4 1.44 09/22/2021   FREET4 1.91 (H) 06/24/2021   FREET4 1.16 02/21/2021   FREET4 0.74 (L) 01/13/2021   FREET4 1.41 03/08/2020    Thyroid uptake and scan on April 10, 2020 FINDINGS: Homogeneous tracer distribution in both thyroid lobes.  No focal areas of increased or decreased tracer localization.  4 hour I-123 uptake = 23.7% (normal 5-20%)  ,  24 hour I-123 uptake = 46.9% (normal 10-30%)   IMPRESSION: Normal thyroid scan.  Elevated 4 hour and 24 hour radio iodine uptakes.  Findings consistent with Graves disease.   Assessment & Plan:   1.  RAI induced hypothyroidism 2.  Graves' disease-resolved Her previsit thyroid function tests are consistent with appropriate replacement.  She is advised to continue levothyroxine 25 mcg p.o. daily before breakfast.  There is some chance that she may not need thyroid hormone replacement based on her subsequent labs.  - We discussed about the correct intake of her thyroid hormone, on empty stomach at fasting, with water, separated by at least 30 minutes  from breakfast and other medications,  and separated by more than 4 hours from calcium, iron, multivitamins, acid reflux medications (PPIs). -Patient is made aware of the fact that thyroid hormone replacement is needed for life, dose to be adjusted by periodic monitoring of thyroid function tests.   In light of the fact that she has prediabetes with point-of-care A1c of 5.7% today, hyperlipidemia, morbid obesity, she remains a good candidate for lifestyle medicine.    - she acknowledges that there is a room for improvement in her food and drink choices. - Suggestion is made for her to avoid simple carbohydrates  from her diet including Cakes, Sweet Desserts, Ice Cream, Soda (diet and regular), Sweet Tea, Candies, Chips, Cookies, Store Bought Juices, Alcohol , Artificial Sweeteners,  Coffee Creamer, and "Sugar-free" Products, Lemonade. This will help patient to have more stable blood glucose profile and potentially avoid unintended weight gain.  The following Lifestyle Medicine recommendations according to [place]  [facility]) were discussed and and offered to patient and she  agrees to start the journey:  A. Whole Foods, Plant-Based Nutrition comprising of fruits and vegetables, plant-based proteins, whole-grain carbohydrates was discussed in detail with the patient.   A list for source of those nutrients were also provided to the patient.  Patient will use only water or unsweetened tea for hydration. B.  The need to stay away from risky substances including alcohol, smoking; obtaining 7 to 9 hours of restorative sleep, at least 150 minutes of moderate intensity exercise weekly, the importance of healthy social connections,  and stress management techniques were discussed. C.  A full color page of  Calorie density of various food groups per pound showing examples of each food groups was provided to the patient.  He is advised to finish and stop her vitamin D2 supplements.  She may need maintenance with vitamin D3 after next measurement.      -Patient is advised to maintain close follow up with Lindell Spar, MD for primary care needs.   I spent  21  minutes in the care of the patient today including review of labs from Thyroid Function, CMP, and other relevant labs ; imaging/biopsy records (current and previous including abstractions from other facilities); face-to-face time discussing  her lab results and symptoms, medications  doses, her options of short and long term treatment based on the latest standards of care / guidelines;   and documenting the encounter.  Linda Skinner  participated in the discussions, expressed understanding, and voiced agreement with the above plans.  All questions were answered to her satisfaction. she is encouraged to contact clinic should she have any questions or concerns prior to her return visit.    Follow up plan: Return in about 6 months (around 02/03/2023) for F/U with Pre-visit Labs.   Thank you for involving me in the care of this pleasant patient, and I will continue to update you with her progress.  Glade Lloyd, MD [facility] Phone: [phone]  Fax: [phone]   08/03/2022, 1:00 PM  This note was partially dictated with voice recognition software. Similar sounding words can be transcribed inadequately or may not  be corrected upon review.

## 2022-08-03 NOTE — Patient Instructions (Signed)

## 2022-10-14 ENCOUNTER — Ambulatory Visit (INDEPENDENT_AMBULATORY_CARE_PROVIDER_SITE_OTHER): Payer: Medicaid Other | Admitting: Internal Medicine

## 2022-10-14 ENCOUNTER — Encounter: Payer: Self-pay | Admitting: Internal Medicine

## 2022-10-14 VITALS — BP 121/76 | HR 88 | Ht 64.0 in | Wt 293.8 lb

## 2022-10-14 DIAGNOSIS — Z0001 Encounter for general adult medical examination with abnormal findings: Secondary | ICD-10-CM

## 2022-10-14 DIAGNOSIS — F331 Major depressive disorder, recurrent, moderate: Secondary | ICD-10-CM

## 2022-10-14 DIAGNOSIS — E890 Postprocedural hypothyroidism: Secondary | ICD-10-CM | POA: Diagnosis not present

## 2022-10-14 DIAGNOSIS — D508 Other iron deficiency anemias: Secondary | ICD-10-CM | POA: Diagnosis not present

## 2022-10-14 DIAGNOSIS — Z1159 Encounter for screening for other viral diseases: Secondary | ICD-10-CM | POA: Diagnosis not present

## 2022-10-14 NOTE — Assessment & Plan Note (Signed)
    10/14/2022    3:42 PM 07/16/2022    4:10 PM 06/11/2022    1:47 PM  PHQ9 SCORE ONLY  PHQ-9 Total Score 0 17 16   Well-controlled Had added Wellbutrin 100 mg BID, symptoms have improved

## 2022-10-14 NOTE — Assessment & Plan Note (Signed)
Lab Results  Component Value Date   TSH 1.800 07/28/2022   S/p radioactive ablation On Levothyroxine 25 mcg QD Followed by Endocrinology

## 2022-10-14 NOTE — Progress Notes (Signed)
Established Patient Office Visit  Subjective:  Patient ID: Asana Nakagawa, female    DOB: 04/22/1991  Age: 32 y.o. MRN: [id number]  CC:  Chief Complaint  Patient presents with   Annual Exam    HPI Telethia Strozier is a 32 y.o. female with past medical history of Grave's disease and major depressive disorder who presents for annual physical.  Hypothyroidism: She had radioactive iodine therapy for Graves' disease in 04/2020.  She is on Levothyroxine 25 mcg QD.   Depression: She has been taking Wellbutrin 100 mg BID for depression and has noticed improvement in her symptoms. She has been stressed due to her grandmother's cancer diagnosis. She was nervous around her mother's death anniversary.  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 Denies any SI or HI currently.   Morbid obesity: She has been struggling to lose weight.  She admits that she has improved her diet and has been trying intermittent fasting. She agrees to strictly follow low-carb diet for now.     Past Medical History:  Diagnosis Date   BV (bacterial vaginosis) 11/12/2021   11/12/21 rx flagyl   Encounter for tubal ligation 09/16/2017   Gestational diabetes    metformin   Graves disease    Maternal varicella, non-immune 02/18/2017   Immunize p delivery   Medical history non-contributory    Supervision of high risk pregnancy, antepartum 02/17/2017    [facility] [place] [facility] at  [facility] [place] Dating By LMP c/w 1st trimester U/S 7wk Pap 05/23/15 neg [facility]D (got records) GC/CT Initial:   -/-             36+wks:-/- Genetic Screen NT/IT: neg CF screen declined Anatomic [place] Normal female Flu vaccine 02/17/17  Tdap Recommended ~ 28wks Glucose Screen  2 hr   99/122/114  GDM GBS pos Feed Preference both Contraception BTL, consent 02/06/1   SVD (spontaneous vaginal delivery) 09/15/2017    Past Surgical History:  Procedure Laterality Date   MOUTH SURGERY     TUBAL  LIGATION N/A 09/15/2017   Procedure: POST PARTUM TUBAL LIGATION;  Surgeon: Kathrynn Running, MD;  Location: [facility];  Service: Gynecology;  Laterality: N/A;   WISDOM TOOTH EXTRACTION      Family History  Problem Relation Age of Onset   Lupus Mother    Cancer Paternal Grandfather    Atrial fibrillation Maternal Grandmother     Social History   Socioeconomic History   Marital status: Single    Spouse name: Not on file   Number of children: Not on file   Years of education: Not on file   Highest education level: Not on file  Occupational History   Not on file  Tobacco Use   Smoking status: Former    Packs/day: 0.01    Years: 4.00    Additional pack years: 0.00    Total pack years: 0.04    Types: Cigars, Cigarettes   Smokeless tobacco: Never  Vaping Use   Vaping Use: Never used  Substance and Sexual Activity   Alcohol use: No   Drug use: No   Sexual activity: Not Currently    Birth control/protection: Surgical    Comment: tubal  Other Topics Concern   Not on file  Social History Narrative   Not on file   Social Determinants of Health   Financial Resource Strain: Low Risk  (05/01/2020)   Overall Financial Resource Strain (CARDIA)  Difficulty of Paying Living Expenses: Not hard at all  Food Insecurity: No Food Insecurity (05/01/2020)   Hunger Vital Sign    Worried About Running Out of Food in the Last Year: Never true    Ran Out of Food in the Last Year: Never true  Transportation Needs: No Transportation Needs (05/01/2020)   PRAPARE - Administrator, Civil Service (Medical): No    Lack of Transportation (Non-Medical): No  Physical Activity: Insufficiently Active (05/01/2020)   Exercise Vital Sign    Days of Exercise per Week: 3 days    Minutes of Exercise per Session: 20 min  Stress: Stress Concern Present (05/01/2020)   [name]    Feeling of Stress : To some extent   Social Connections: Socially Isolated (05/01/2020)   Social Connection and Isolation Panel [NHANES]    Frequency of Communication with Friends and Family: Twice a week    Frequency of Social Gatherings with Friends and Family: Twice a week    Attends Religious Services: Never    Database administrator or Organizations: No    Attends Banker Meetings: Never    Marital Status: Never married  Intimate Partner Violence: Not At Risk (05/01/2020)   Humiliation, Afraid, Rape, and Kick questionnaire    Fear of Current or Ex-Partner: No    Emotionally Abused: No    Physically Abused: No    Sexually Abused: No    Outpatient Medications Prior to Visit  Medication Sig Dispense Refill   buPROPion ER (WELLBUTRIN SR) 100 MG 12 hr tablet Take 1 tablet (100 mg total) by mouth 2 (two) times daily. 60 tablet 5   fluconazole (DIFLUCAN) 150 MG tablet Take 1 now and repeat 1 in 3 days if needed 2 tablet 1   fluticasone (FLONASE) 50 MCG/ACT nasal spray Place 1 spray into both nostrils 2 (two) times daily. 16 g 2   levothyroxine (SYNTHROID) 25 MCG tablet Take 1 tablet (25 mcg total) by mouth daily before breakfast. 90 tablet 1   meloxicam (MOBIC) 15 MG tablet Take 1 tablet (15 mg total) by mouth daily. 30 tablet 3   promethazine-dextromethorphan (PROMETHAZINE-DM) 6.25-15 MG/5ML syrup Take 5 mLs by mouth 4 (four) times daily as needed. 100 mL 0   No facility-administered medications prior to visit.    No Known Allergies  ROS Review of Systems  Constitutional:  Negative for chills and fever.  HENT:  Negative for congestion, sinus pressure, sinus pain and sore throat.   Eyes:  Negative for pain and discharge.  Respiratory:  Negative for cough and shortness of breath.   Cardiovascular:  Negative for chest pain and palpitations.  Gastrointestinal:  Negative for abdominal pain, constipation, diarrhea, nausea and vomiting.  Endocrine: Negative for polydipsia and polyuria.  Genitourinary:   Negative for dysuria and hematuria.  Musculoskeletal:  Negative for neck pain and neck stiffness.  Skin:  Negative for rash.  Neurological:  Negative for dizziness and weakness.  Psychiatric/Behavioral:  Negative for agitation, sleep disturbance and suicidal ideas.       Objective:    Physical Exam Vitals reviewed.  Constitutional:      General: She is not in acute distress.    Appearance: She is obese. She is not diaphoretic.  HENT:     Head: Normocephalic and atraumatic.     Nose: Nose normal.     Mouth/Throat:     Mouth: Mucous membranes are moist.  Eyes:  General: No scleral icterus.    Extraocular Movements: Extraocular movements intact.  Cardiovascular:     Rate and Rhythm: Normal rate and regular rhythm.     Pulses: Normal pulses.     Heart sounds: No murmur heard. Pulmonary:     Breath sounds: Normal breath sounds. No wheezing or rales.  Abdominal:     Palpations: Abdomen is soft.     Tenderness: There is no abdominal tenderness.  Musculoskeletal:     Cervical back: Normal range of motion and neck supple. No rigidity or tenderness.     Right lower leg: No edema.     Left lower leg: No edema.  Skin:    General: Skin is warm.     Coloration: Skin is not pale.  Neurological:     General: No focal deficit present.     Mental Status: She is alert and oriented to person, place, and time.     Cranial Nerves: No cranial nerve deficit.     Sensory: No sensory deficit.     Motor: No weakness.  Psychiatric:        Mood and Affect: Mood normal.        Behavior: Behavior normal.     BP 121/76 (BP Location: Right Arm, Patient Position: Sitting, Cuff Size: Large)   Pulse 88   Ht 5\' 4"  (1.626 m)   Wt 293 lb 12.8 oz (133.3 kg)   SpO2 96%   BMI 50.43 kg/m  Wt Readings from Last 3 Encounters:  10/14/22 293 lb 12.8 oz (133.3 kg)  08/03/22 282 lb (127.9 kg)  07/16/22 285 lb 6.4 oz (129.5 kg)    Lab Results  Component Value Date   TSH 1.800 07/28/2022   Lab  Results  Component Value Date   WBC 5.1 10/08/2021   HGB 10.0 (L) 10/08/2021   HCT 32.2 (L) 10/08/2021   MCV 77 (L) 10/08/2021   PLT 294 10/08/2021   Lab Results  Component Value Date   NA 138 12/25/2021   K 4.5 12/25/2021   CO2 23 12/25/2021   GLUCOSE 99 12/25/2021   BUN 8 12/25/2021   CREATININE 0.70 12/25/2021   BILITOT 0.2 12/25/2021   ALKPHOS 50 12/25/2021   AST 15 12/25/2021   ALT 13 12/25/2021   PROT 7.3 12/25/2021   ALBUMIN 4.2 12/25/2021   CALCIUM 9.5 12/25/2021   ANIONGAP 9 05/05/2018   EGFR 119 12/25/2021   Lab Results  Component Value Date   CHOL 155 07/28/2022   Lab Results  Component Value Date   HDL 51 07/28/2022   Lab Results  Component Value Date   LDLCALC 88 07/28/2022   Lab Results  Component Value Date   TRIG 86 07/28/2022   Lab Results  Component Value Date   CHOLHDL 3.0 07/28/2022   Lab Results  Component Value Date   HGBA1C 5.7 08/03/2022      Assessment & Plan:   Problem List Items Addressed This Visit    Problem List Items Addressed This Visit       Endocrine   Hypothyroidism    Lab Results  Component Value Date   TSH 1.800 07/28/2022  S/p radioactive ablation On Levothyroxine 25 mcg QD Followed by Endocrinology      Relevant Orders   CMP14+EGFR     Other   Depression, major, recurrent, moderate (HCC)       10/14/2022    3:42 PM 07/16/2022    4:10 PM 06/11/2022    1:47 PM  PHQ9 SCORE ONLY  PHQ-9 Total Score 0 17 16  Well-controlled Had added Wellbutrin 100 mg BID, symptoms have improved      Encounter for general adult medical examination with abnormal findings - Primary    Physical exam as mentioned above. Recent blood tests reviewed from chart.  Added CBC and CMP. PAP smear in 2021      Other Visit Diagnoses     Other iron deficiency anemia       Relevant Orders   CBC with Differential/Platelet   Need for hepatitis C screening test       Relevant Orders   Hepatitis C Antibody          No  orders of the defined types were placed in this encounter.   Follow-up: Return in about 6 months (around 04/16/2023) for MDD.    Anabel Halon, MD

## 2022-10-14 NOTE — Patient Instructions (Signed)
Please continue to take medications as prescribed. ? ?Please continue to follow low carb diet and perform moderate exercise/walking at least 150 mins/week. ?

## 2022-10-14 NOTE — Assessment & Plan Note (Signed)
Physical exam as mentioned above. Recent blood tests reviewed from chart.  Added CBC and CMP. PAP smear in 2021

## 2022-10-15 LAB — CBC WITH DIFFERENTIAL/PLATELET
Basophils Absolute: 0 10*3/uL (ref 0.0–0.2)
Basos: 0 %
EOS (ABSOLUTE): 0.1 10*3/uL (ref 0.0–0.4)
Eos: 2 %
Hematocrit: 32 % — ABNORMAL LOW (ref 34.0–46.6)
Hemoglobin: 10 g/dL — ABNORMAL LOW (ref 11.1–15.9)
Immature Grans (Abs): 0 10*3/uL (ref 0.0–0.1)
Immature Granulocytes: 0 %
Lymphocytes Absolute: 3.3 10*3/uL — ABNORMAL HIGH (ref 0.7–3.1)
Lymphs: 54 %
MCH: 25.5 pg — ABNORMAL LOW (ref 26.6–33.0)
MCHC: 31.3 g/dL — ABNORMAL LOW (ref 31.5–35.7)
MCV: 82 fL (ref 79–97)
Monocytes Absolute: 0.4 10*3/uL (ref 0.1–0.9)
Monocytes: 7 %
Neutrophils Absolute: 2.3 10*3/uL (ref 1.4–7.0)
Neutrophils: 37 %
Platelets: 287 10*3/uL (ref 150–450)
RBC: 3.92 x10E6/uL (ref 3.77–5.28)
RDW: 14.6 % (ref 11.7–15.4)
WBC: 6.1 10*3/uL (ref 3.4–10.8)

## 2022-10-15 LAB — CMP14+EGFR
ALT: 12 IU/L (ref 0–32)
AST: 19 IU/L (ref 0–40)
Albumin/Globulin Ratio: 1.5 (ref 1.2–2.2)
Albumin: 4.3 g/dL (ref 3.9–4.9)
Alkaline Phosphatase: 51 IU/L (ref 44–121)
BUN/Creatinine Ratio: 12 (ref 9–23)
BUN: 9 mg/dL (ref 6–20)
Bilirubin Total: <0.2 mg/dL (ref 0.0–1.2)
CO2: 21 mmol/L (ref 20–29)
Calcium: 9.3 mg/dL (ref 8.7–10.2)
Chloride: 103 mmol/L (ref 96–106)
Creatinine, Ser: 0.76 mg/dL (ref 0.57–1.00)
Globulin, Total: 2.8 g/dL (ref 1.5–4.5)
Glucose: 90 mg/dL (ref 70–99)
Potassium: 4.1 mmol/L (ref 3.5–5.2)
Sodium: 137 mmol/L (ref 134–144)
Total Protein: 7.1 g/dL (ref 6.0–8.5)
eGFR: 107 mL/min/{1.73_m2} (ref >59)

## 2022-10-15 LAB — HEPATITIS C ANTIBODY: Hep C Virus Ab: NONREACTIVE

## 2023-01-28 ENCOUNTER — Other Ambulatory Visit: Payer: Self-pay | Admitting: Internal Medicine

## 2023-01-28 DIAGNOSIS — F331 Major depressive disorder, recurrent, moderate: Secondary | ICD-10-CM

## 2023-01-28 MED ORDER — BUPROPION HCL ER (SR) 100 MG PO TB12: 100.0000 mg | ORAL_TABLET | Freq: Two times a day (BID) | ORAL | 5 refills | Status: DC

## 2023-02-03 ENCOUNTER — Encounter: Payer: Self-pay | Admitting: "Endocrinology

## 2023-02-03 ENCOUNTER — Ambulatory Visit: Payer: Medicaid Other | Admitting: "Endocrinology

## 2023-02-03 VITALS — BP 124/66 | HR 72 | Ht 64.0 in | Wt 296.0 lb

## 2023-02-03 DIAGNOSIS — R7303 Prediabetes: Secondary | ICD-10-CM | POA: Diagnosis not present

## 2023-02-03 DIAGNOSIS — E782 Mixed hyperlipidemia: Secondary | ICD-10-CM | POA: Diagnosis not present

## 2023-02-03 DIAGNOSIS — E890 Postprocedural hypothyroidism: Secondary | ICD-10-CM | POA: Diagnosis not present

## 2023-02-03 LAB — POCT GLYCOSYLATED HEMOGLOBIN (HGB A1C): HbA1c, POC (prediabetic range): 5.8 % (ref 5.7–6.4)

## 2023-02-03 MED ORDER — METFORMIN HCL ER 500 MG PO TB24: 500.0000 mg | ORAL_TABLET | Freq: Every day | ORAL | 1 refills | Status: DC

## 2023-02-03 NOTE — Progress Notes (Signed)
02/03/2023    Endocrinology follow-up note  Subjective:    Patient ID: Linda Skinner, female    DOB: 1990-09-07, PCP Anabel Halon, MD.   Past Medical History:  Diagnosis Date   BV (bacterial vaginosis) 11/12/2021   11/12/21 rx flagyl   Encounter for tubal ligation 09/16/2017   Gestational diabetes    metformin   Graves disease    Maternal varicella, non-immune 02/18/2017   Immunize p delivery   Medical history non-contributory    Supervision of high risk pregnancy, antepartum 02/17/2017    [facility] at  [facility] [place] Dating By LMP c/w 1st trimester U/S 7wk Pap 05/23/15 neg [facility] (got records) GC/CT Initial:   -/-             36+wks:-/- Genetic Screen NT/IT: neg CF screen declined Anatomic [place] Normal female Flu vaccine 02/17/17  Tdap Recommended ~ 28wks Glucose Screen  2 hr   99/122/114  GDM GBS pos Feed Preference both Contraception BTL, consent 02/06/1   SVD (spontaneous vaginal delivery) 09/15/2017    Past Surgical History:  Procedure Laterality Date   MOUTH SURGERY     TUBAL LIGATION N/A 09/15/2017   Procedure: POST PARTUM TUBAL LIGATION;  Surgeon: Kathrynn Running, MD;  Location: [facility];  Service: Gynecology;  Laterality: N/A;   WISDOM TOOTH EXTRACTION      Social History   Socioeconomic History   Marital status: Single    Spouse name: Not on file   Number of children: Not on file   Years of education: Not on file   Highest education level: Not on file  Occupational History   Not on file  Tobacco Use   Smoking status: Former    Current packs/day: 0.01    Types: Cigars, Cigarettes   Smokeless tobacco: Never  Vaping Use   Vaping status: Never Used  Substance and Sexual Activity   Alcohol use: No   Drug use: No   Sexual activity: Not Currently    Birth control/protection: Surgical    Comment: tubal  Other Topics Concern   Not on file  Social History Narrative   Not on file   Social Determinants of Health    Financial Resource Strain: Low Risk  (05/01/2020)   Overall Financial Resource Strain (CARDIA)    Difficulty of Paying Living Expenses: Not hard at all  Food Insecurity: No Food Insecurity (05/01/2020)   Hunger Vital Sign    Worried About Running Out of Food in the Last Year: Never true    Ran Out of Food in the Last Year: Never true  Transportation Needs: No Transportation Needs (05/01/2020)   PRAPARE - Administrator, Civil Service (Medical): No    Lack of Transportation (Non-Medical): No  Physical Activity: Insufficiently Active (05/01/2020)   Exercise Vital Sign    Days of Exercise per Week: 3 days    Minutes of Exercise per Session: 20 min  Stress: Stress Concern Present (05/01/2020)   [name]    Feeling of Stress : To some extent  Social Connections: Socially Isolated (05/01/2020)   Social Connection and Isolation Panel [NHANES]    Frequency of Communication with Friends and Family: Twice a week    Frequency of Social Gatherings with Friends and Family: Twice a week    Attends Religious Services: Never    Database administrator or Organizations: No    Attends  Club or Organization Meetings: Never    Marital Status: Never married    Family History  Problem Relation Age of Onset   Lupus Mother    Cancer Paternal Grandfather    Atrial fibrillation Maternal Grandmother     Outpatient Encounter Medications as of 02/03/2023  Medication Sig   metFORMIN (GLUCOPHAGE-XR) 500 MG 24 hr tablet Take 1 tablet (500 mg total) by mouth daily with breakfast.   buPROPion ER (WELLBUTRIN SR) 100 MG 12 hr tablet Take 1 tablet (100 mg total) by mouth 2 (two) times daily.   levothyroxine (SYNTHROID) 25 MCG tablet Take 1 tablet (25 mcg total) by mouth daily before breakfast.   [DISCONTINUED] fluconazole (DIFLUCAN) 150 MG tablet Take 1 now and repeat 1 in 3 days if needed   [DISCONTINUED] fluticasone (FLONASE) 50 MCG/ACT  nasal spray Place 1 spray into both nostrils 2 (two) times daily.   [DISCONTINUED] meloxicam (MOBIC) 15 MG tablet Take 1 tablet (15 mg total) by mouth daily.   No facility-administered encounter medications on file as of 02/03/2023.    ALLERGIES: No Known Allergies  VACCINATION STATUS: Immunization History  Administered Date(s) Administered   Influenza,inj,Quad PF,6+ Mos 02/17/2017, 03/01/2020, 04/15/2021   Influenza-Unspecified 03/01/2022   PFIZER(Purple Top)SARS-COV-2 Vaccination 02/07/2020, 03/01/2020   PPD Test 03/11/2020, 03/25/2020   Tdap 09/16/2017     HPI  Linda Skinner is 32 y.o. female who presents today with her new labs and uptake and scan of her thyroid.    she was recently seen in consultation for hyperthyroidism requested by Anabel Halon, MD. 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  She continues to do well on minimal dose of levothyroxine, currently 25 mcg p.o. daily before breakfast.   Her previsit labs are consistent with appropriate replacement.    She reports compliance with medication.  She has no new complaints today, presents with steady weight since last visit.  She maintained the changes she made on her lifestyle.     She denies palpitations, tremor, nor heat intolerance at this time.  she denies family history of thyroid dysfunction. -She denies family hx of thyroid cancer. she denies personal history of goiter. she  is willing to proceed with appropriate work up and therapy for thyrotoxicosis. -She also has prediabetes with point-of-care A1c of 5.8% today.  She presents with further weight gain.                           Review of systems  Constitutional: + Weight gain, + fatigue.      Objective:    BP 124/66   Pulse 72   Ht 5'  4" (1.626 m)   Wt 296 lb (134.3 kg)   BMI 50.81 kg/m   Wt Readings from Last 3 Encounters:  02/03/23 296 lb (134.3 kg)  10/14/22 293 lb 12.8 oz (133.3 kg)  08/03/22 282 lb (127.9 kg)                                                Physical exam  Constitutional: Body mass index is 50.81 kg/m., not in acute distress, + normal state of mind   CMP  Component Value Date/Time   NA 137 10/14/2022 1612   K 4.1 10/14/2022 1612   CL 103 10/14/2022 1612   CO2 21 10/14/2022 1612   GLUCOSE 90 10/14/2022 1612   GLUCOSE 108 (H) 05/05/2018 0750   BUN 9 10/14/2022 1612   CREATININE 0.76 10/14/2022 1612   CALCIUM 9.3 10/14/2022 1612   PROT 7.1 10/14/2022 1612   ALBUMIN 4.3 10/14/2022 1612   AST 19 10/14/2022 1612   ALT 12 10/14/2022 1612   ALKPHOS 51 10/14/2022 1612   BILITOT <0.2 10/14/2022 1612   GFRNONAA 118 03/08/2020 0919   GFRAA 136 03/08/2020 0919     CBC    Component Value Date/Time   WBC 6.1 10/14/2022 1612   WBC 10.3 05/05/2018 0750   RBC 3.92 10/14/2022 1612   RBC 4.48 05/05/2018 0750   HGB 10.0 (L) 10/14/2022 1612   HCT 32.0 (L) 10/14/2022 1612   PLT 287 10/14/2022 1612   MCV 82 10/14/2022 1612   MCH 25.5 (L) 10/14/2022 1612   MCH 25.4 (L) 05/05/2018 0750   MCHC 31.3 (L) 10/14/2022 1612   MCHC 31.8 05/05/2018 0750   RDW 14.6 10/14/2022 1612   LYMPHSABS 3.3 (H) 10/14/2022 1612   MONOABS 0.9 05/05/2018 0750   EOSABS 0.1 10/14/2022 1612   BASOSABS 0.0 10/14/2022 1612    Diabetic Labs (most recent): Lab Results  Component Value Date   HGBA1C 5.8 02/03/2023   HGBA1C 5.7 08/03/2022   HGBA1C 5.6 04/02/2022   MICROALBUR 13.7 (H) 03/13/2020    Lipid Panel     Component Value Date/Time   CHOL 155 07/28/2022 0802   TRIG 86 07/28/2022 0802   HDL 51 07/28/2022 0802   CHOLHDL 3.0 07/28/2022 0802   LDLCALC 88 07/28/2022 0802   LABVLDL 16 07/28/2022 0802     Lab Results  Component Value Date   TSH 2.670 01/27/2023   TSH 1.800 07/28/2022   TSH 0.070  (L) 03/26/2022   TSH 0.028 (L) 12/25/2021   TSH 0.022 (L) 10/08/2021   TSH 0.047 (L) 09/22/2021   TSH <0.005 (L) 06/24/2021   TSH 1.660 02/21/2021   TSH 11.900 (H) 01/13/2021   TSH <0.005 (L) 03/08/2020   TSH <0.005 (L) 03/08/2020   FREET4 1.08 01/27/2023   FREET4 1.11 07/28/2022   FREET4 1.39 03/26/2022   FREET4 1.33 12/25/2021   FREET4 1.55 10/08/2021   FREET4 1.44 09/22/2021   FREET4 1.91 (H) 06/24/2021   FREET4 1.16 02/21/2021   FREET4 0.74 (L) 01/13/2021   FREET4 1.41 03/08/2020    Thyroid uptake and scan on April 10, 2020 FINDINGS: Homogeneous tracer distribution in both thyroid lobes.  No focal areas of increased or decreased tracer localization.  4 hour I-123 uptake = 23.7% (normal 5-20%)  ,  24 hour I-123 uptake = 46.9% (normal 10-30%)   IMPRESSION: Normal thyroid scan.  Elevated 4 hour and 24 hour radio iodine uptakes.  Findings consistent with Graves disease.   Assessment & Plan:   1.  RAI induced hypothyroidism 2.  Graves' disease-resolved Her previsit thyroid function tests are consistent with appropriate replacement.  She is advised to continue levothyroxine 25 mcg p.o. daily before breakfast.     There is some chance that she may not need thyroid hormone replacement based on her subsequent labs.  - We discussed about the correct intake of her thyroid hormone, on empty stomach at fasting, with water, separated by at least 30 minutes from breakfast and other medications,  and separated by more than  4 hours from calcium, iron, multivitamins, acid reflux medications (PPIs). -Patient is made aware of the fact that thyroid hormone replacement is needed for life, dose to be adjusted by periodic monitoring of thyroid function tests.  In light of the fact that she has prediabetes with point-of-care A1c of 5.8% today, hyperlipidemia, morbid obesity, she remains a good candidate for lifestyle medicine.   -She will benefit from early initiation of metformin  intervention.  I discussed and prescribed metformin 500 mg XR p.o. daily at breakfast.  - she acknowledges that there is a room for improvement in her food and drink choices. - Suggestion is made for her to avoid simple carbohydrates  from her diet including Cakes, Sweet Desserts, Ice Cream, Soda (diet and regular), Sweet Tea, Candies, Chips, Cookies, Store Bought Juices, Alcohol , Artificial Sweeteners,  Coffee Creamer, and "Sugar-free" Products, Lemonade. This will help patient to have more stable blood glucose profile and potentially avoid unintended weight gain.  The following Lifestyle Medicine recommendations according to American College of Lifestyle Medicine  [facility]) were discussed and and offered to patient and she  agrees to start the journey:  A. Whole Foods, Plant-Based Nutrition comprising of fruits and vegetables, plant-based proteins, whole-grain carbohydrates was discussed in detail with the patient.   A list for source of those nutrients were also provided to the patient.  Patient will use only water or unsweetened tea for hydration. B.  The need to stay away from risky substances including alcohol, smoking; obtaining 7 to 9 hours of restorative sleep, at least 150 minutes of moderate intensity exercise weekly, the importance of healthy social connections,  and stress management techniques were discussed. C.  A full color page of  Calorie density of various food groups per pound showing examples of each food groups was provided to the patient.   He is advised to finish and stop her vitamin D2 supplements.  She may need maintenance with vitamin D3 after next measurement.      -Patient is advised to maintain close follow up with Anabel Halon, MD for primary care needs.   I spent  26  minutes in the care of the patient today including review of labs from Thyroid Function, CMP, and other relevant labs ; imaging/biopsy records (current and previous including abstractions from other  facilities); face-to-face time discussing  her lab results and symptoms, medications doses, her options of short and long term treatment based on the latest standards of care / guidelines;   and documenting the encounter.  Linda Skinner  participated in the discussions, expressed understanding, and voiced agreement with the above plans.  All questions were answered to her satisfaction. she is encouraged to contact clinic should she have any questions or concerns prior to her return visit.   Follow up plan: Return in about 6 months (around 08/03/2023) for Fasting Labs  in AM B4 8, A1c -NV.   Thank you for involving me in the care of this pleasant patient, and I will continue to update you with her progress.  Marquis Lunch, MD [facility] Phone: [phone]  Fax: [phone]   02/03/2023, 12:19 PM  This note was partially dictated with voice recognition software. Similar sounding words can be transcribed inadequately or may not  be corrected upon review.

## 2023-02-03 NOTE — Patient Instructions (Signed)

## 2023-02-08 ENCOUNTER — Ambulatory Visit
Admission: RE | Admit: 2023-02-08 | Discharge: 2023-02-08 | Disposition: A | Discharge status: Home / Self Care | Payer: Medicaid Other | Source: Ambulatory Visit | Attending: Nurse Practitioner | Admitting: Nurse Practitioner

## 2023-02-08 VITALS — BP 116/77 | HR 78 | Temp 98.4°F | Resp 16

## 2023-02-08 DIAGNOSIS — Z1152 Encounter for screening for COVID-19: Secondary | ICD-10-CM | POA: Insufficient documentation

## 2023-02-08 DIAGNOSIS — J069 Acute upper respiratory infection, unspecified: Secondary | ICD-10-CM | POA: Insufficient documentation

## 2023-02-08 DIAGNOSIS — J029 Acute pharyngitis, unspecified: Secondary | ICD-10-CM | POA: Insufficient documentation

## 2023-02-08 DIAGNOSIS — J040 Acute laryngitis: Secondary | ICD-10-CM | POA: Insufficient documentation

## 2023-02-08 LAB — POCT RAPID STREP A (OFFICE): Rapid Strep A Screen: NEGATIVE

## 2023-02-08 MED ORDER — LIDOCAINE VISCOUS HCL 2 % MT SOLN: OROMUCOSAL | 0 refills | Status: DC

## 2023-02-08 MED ORDER — PSEUDOEPH-BROMPHEN-DM 30-2-10 MG/5ML PO SYRP: 5.0000 mL | ORAL_SOLUTION | Freq: Four times a day (QID) | ORAL | 0 refills | Status: DC | PRN

## 2023-02-08 MED ORDER — FLUTICASONE PROPIONATE 50 MCG/ACT NA SUSP: 2.0000 | Freq: Every day | NASAL | 0 refills | Status: DC

## 2023-02-08 NOTE — ED Triage Notes (Signed)
Pt c/o hoarseness and sore throat x 4 days, cough nasal congestion x 3 days. Pt states she has tried OTC medicines they have not helped.

## 2023-02-08 NOTE — ED Provider Notes (Signed)
[facility]    CSN: [id number] Arrival date & time: 02/08/23  0851      History   Chief Complaint Chief Complaint  Patient presents with   Cough    No voice, sore throat. - Entered by patient    HPI Linda Skinner is a 32 y.o. female.   The history is provided by the patient.   Patient presents with a 3-day history of nasal congestion, sore throat, runny nose, cough, and hoarseness.  Patient denies fever, chills, headache, wheezing, shortness of breath, difficulty breathing, chest pain, abdominal pain, nausea, vomiting, or diarrhea.  Patient reports she has been taking her over-the-counter allergy medications and cough medications with no relief.  Patient reports that she does work in a [employer], and the patient that she room to recently was diagnosed with COVID.  Past Medical History:  Diagnosis Date   BV (bacterial vaginosis) 11/12/2021   11/12/21 rx flagyl   Encounter for tubal ligation 09/16/2017   Gestational diabetes    metformin   Graves disease    Maternal varicella, non-immune 02/18/2017   Immunize p delivery   Medical history non-contributory    Supervision of high risk pregnancy, antepartum 02/17/2017    [facility] at  [facility] [place] [place] Dating By LMP c/w 1st trimester U/S 7wk Pap 05/23/15 neg [facility] (got records) GC/CT Initial:   -/-             36+wks:-/- Genetic Screen NT/IT: neg CF screen declined Anatomic [place] Normal female Flu vaccine 02/17/17  Tdap Recommended ~ 28wks Glucose Screen  2 hr   99/122/114  GDM GBS pos Feed Preference both Contraception BTL, consent 02/06/1   SVD (spontaneous vaginal delivery) 09/15/2017    Patient Active Problem List   Diagnosis Date Noted   Right knee pain 06/04/2022   Vitamin D deficiency 04/02/2022   BV (bacterial vaginosis) 11/12/2021   OSA (obstructive sleep apnea) 10/15/2021   Hypothyroidism following radioiodine therapy 09/29/2021   Mixed hyperlipidemia 09/29/2021   Hypothyroidism  04/15/2021   Encounter for general adult medical examination with abnormal findings 04/15/2021   Prediabetes 04/15/2021   Tinea versicolor 01/13/2021   Graves disease 04/12/2020   Depression, major, recurrent, moderate (HCC) 03/08/2020   Morbid obesity (HCC) 03/08/2020   Gestational diabetes mellitus, class A2 06/17/2017   Ovarian cyst 05/26/2017    Past Surgical History:  Procedure Laterality Date   MOUTH SURGERY     TUBAL LIGATION N/A 09/15/2017   Procedure: POST PARTUM TUBAL LIGATION;  Surgeon: Kathrynn Running, MD;  Location: [facility];  Service: Gynecology;  Laterality: N/A;   WISDOM TOOTH EXTRACTION      OB History     Gravida  6   Para  4   Term  4   Preterm      AB  2   Living  4      SAB      IAB      Ectopic      Multiple  0   Live Births  4            Home Medications    Prior to Admission medications   Medication Sig Start Date End Date Taking? Authorizing Provider  brompheniramine-pseudoephedrine-DM 30-2-10 MG/5ML syrup Take 5 mLs by mouth 4 (four) times daily as needed. 02/08/23  Yes Saory Carriero-Warren, Sadie Haber, NP  fluticasone (FLONASE) 50 MCG/ACT nasal spray Place 2 sprays into both nostrils daily. 02/08/23  Yes Reid Nawrot-Warren,  Sadie Haber, NP  lidocaine (XYLOCAINE) 2 % solution Gargle and spit 5 mL every 6 hours as needed for throat pain or discomfort. 02/08/23  Yes Quince Santana-Warren, Sadie Haber, NP  buPROPion ER (WELLBUTRIN SR) 100 MG 12 hr tablet Take 1 tablet (100 mg total) by mouth 2 (two) times daily. 01/28/23   Anabel Halon, MD  levothyroxine (SYNTHROID) 25 MCG tablet Take 1 tablet (25 mcg total) by mouth daily before breakfast. 08/03/22   Roma Kayser, MD  metFORMIN (GLUCOPHAGE-XR) 500 MG 24 hr tablet Take 1 tablet (500 mg total) by mouth daily with breakfast. 02/03/23   Nida, Denman George, MD    Family History Family History  Problem Relation Age of Onset   Lupus Mother    Cancer Paternal Grandfather    Atrial  fibrillation Maternal Grandmother     Social History Social History   Tobacco Use   Smoking status: Former    Current packs/day: 0.01    Types: Cigars, Cigarettes   Smokeless tobacco: Never  Vaping Use   Vaping status: Never Used  Substance Use Topics   Alcohol use: No   Drug use: No     Allergies   Patient has no known allergies.   Review of Systems Review of Systems Per HPI  Physical Exam Triage Vital Signs ED Triage Vitals  Encounter Vitals Group     BP 02/08/23 0906 116/77     Systolic BP Percentile --      Diastolic BP Percentile --      Pulse Rate 02/08/23 0906 78     Resp 02/08/23 0906 16     Temp 02/08/23 0906 98.4 F (36.9 C)     Temp Source 02/08/23 0906 Oral     SpO2 02/08/23 0906 97 %     Weight --      Height --      Head Circumference --      Peak Flow --      Pain Score 02/08/23 0910 0     Pain Loc --      Pain Education --      Exclude from Growth Chart --    No data found.  Updated Vital Signs BP 116/77 (BP Location: Right Arm)   Pulse 78   Temp 98.4 F (36.9 C) (Oral)   Resp 16   LMP 01/21/2023   SpO2 97%   Visual Acuity Right Eye Distance:   Left Eye Distance:   Bilateral Distance:    Right Eye Near:   Left Eye Near:    Bilateral Near:     Physical Exam Vitals and nursing note reviewed.  Constitutional:      General: She is not in acute distress.    Appearance: Normal appearance.  HENT:     Head: Normocephalic.     Right Ear: Tympanic membrane, ear canal and external ear normal.     Left Ear: Tympanic membrane, ear canal and external ear normal.     Nose: Congestion present.     Mouth/Throat:     Mouth: Mucous membranes are moist.     Pharynx: Posterior oropharyngeal erythema present.     Comments: Cobblestoning present to posterior oropharynx Eyes:     Extraocular Movements: Extraocular movements intact.     Conjunctiva/sclera: Conjunctivae normal.     Pupils: Pupils are equal, round, and reactive to light.   Cardiovascular:     Rate and Rhythm: Normal rate and regular rhythm.     Pulses: Normal pulses.  Heart sounds: Normal heart sounds.  Pulmonary:     Effort: Pulmonary effort is normal. No respiratory distress.     Breath sounds: Normal breath sounds. No stridor. No wheezing, rhonchi or rales.  Abdominal:     General: Bowel sounds are normal.     Palpations: Abdomen is soft.     Tenderness: There is no abdominal tenderness.  Musculoskeletal:     Cervical back: Normal range of motion.  Lymphadenopathy:     Cervical: No cervical adenopathy.  Skin:    General: Skin is warm and dry.  Neurological:     General: No focal deficit present.     Mental Status: She is alert and oriented to person, place, and time.  Psychiatric:        Mood and Affect: Mood normal.        Behavior: Behavior normal.      UC Treatments / Results  Labs (all labs ordered are listed, but only abnormal results are displayed) Labs Reviewed  SARS CORONAVIRUS 2 (TAT 6-24 HRS)  CULTURE, GROUP A STREP [facility])  POCT RAPID STREP A (OFFICE)    EKG   Radiology No results found.  Procedures Procedures (including critical care time)  Medications Ordered in UC Medications - No data to display  Initial Impression / Assessment and Plan / UC Course  I have reviewed the triage vital signs and the nursing notes.  Pertinent labs & imaging results that were available during my care of the patient were reviewed by me and considered in my medical decision making (see chart for details).  The patient is well-appearing, she is in no acute distress, vital signs are stable.  Rapid strep test is negative.  Throat culture and COVID test are pending.  Suspect a viral upper respiratory infection along with laryngitis.  Symptomatic treatment provided for patient's cough with Bromfed-DM, with a nasal congestion and runny nose, fluticasone 50 mcg nasal spray was provided, and viscous lidocaine 2% was provided for patient's  sore throat.  Supportive care recommendations were provided and discussed with the patient to include over-the-counter analgesics, warm salt water gargles, voice rest, and normal saline nasal spray.  Patient was given indications of when follow-up may be necessary, advised to follow-up in this clinic or with her PCP if symptoms do not improve..  Patient is in agreement with this plan of care and verbalizes understanding.  All questions were answered.  Patient stable for discharge.  Work note was provided.  Final Clinical Impressions(s) / UC Diagnoses   Final diagnoses:  Sore throat  Laryngitis  Upper respiratory infection, viral  Encounter for screening for COVID-19     Discharge Instructions      The rapid strep test was negative.  Throat culture and COVID test are pending.  You will be contacted if the pending test results are abnormal.  You will also have access to the results via MyChart. Take medication as prescribed. May take over-the-counter Tylenol or ibuprofen as needed for pain, fever, general discomfort. Increase fluids and allow for plenty of rest. Recommend normal saline nasal spray throughout the day to help with nasal congestion and runny nose. Warm salt water gargles 3-4 times daily throat pain persist. Recommend voice rest for the next 2 to 3 days to help with hoarseness. Recommend using a humidifier in your bedroom at nighttime during sleep and sleeping elevated on pillows while cough symptoms persist. Symptoms should improve over the next 5 to 7 days, if you do not experience any  improvement, or if symptoms appear to worsen, please follow-up in this clinic or with your primary care physician for further evaluation. Follow-up as needed.      ED Prescriptions     Medication Sig Dispense Auth. Provider   brompheniramine-pseudoephedrine-DM 30-2-10 MG/5ML syrup Take 5 mLs by mouth 4 (four) times daily as needed. 140 mL Pierra Skora-Warren, Sadie Haber, NP   lidocaine  (XYLOCAINE) 2 % solution Gargle and spit 5 mL every 6 hours as needed for throat pain or discomfort. 100 mL Zelia Yzaguirre-Warren, Sadie Haber, NP   fluticasone (FLONASE) 50 MCG/ACT nasal spray Place 2 sprays into both nostrils daily. 16 g Talecia Sherlin-Warren, Sadie Haber, NP      PDMP not reviewed this encounter.   Abran Cantor, NP 02/08/23 [id number]

## 2023-02-08 NOTE — Discharge Instructions (Addendum)
The rapid strep test was negative.  Throat culture and COVID test are pending.  You will be contacted if the pending test results are abnormal.  You will also have access to the results via MyChart. Take medication as prescribed. May take over-the-counter Tylenol or ibuprofen as needed for pain, fever, general discomfort. Increase fluids and allow for plenty of rest. Recommend normal saline nasal spray throughout the day to help with nasal congestion and runny nose. Warm salt water gargles 3-4 times daily throat pain persist. Recommend voice rest for the next 2 to 3 days to help with hoarseness. Recommend using a humidifier in your bedroom at nighttime during sleep and sleeping elevated on pillows while cough symptoms persist. Symptoms should improve over the next 5 to 7 days, if you do not experience any improvement, or if symptoms appear to worsen, please follow-up in this clinic or with your primary care physician for further evaluation. Follow-up as needed.

## 2023-02-09 LAB — SARS CORONAVIRUS 2 (TAT 6-24 HRS): SARS Coronavirus 2: NEGATIVE

## 2023-02-10 LAB — CULTURE, GROUP A STREP (THRC)

## 2023-03-03 ENCOUNTER — Ambulatory Visit: Payer: Medicaid Other

## 2023-03-03 DIAGNOSIS — Z111 Encounter for screening for respiratory tuberculosis: Secondary | ICD-10-CM

## 2023-03-05 ENCOUNTER — Other Ambulatory Visit (HOSPITAL_COMMUNITY)
Admission: RE | Admit: 2023-03-05 | Discharge: 2023-03-05 | Disposition: A | Discharge status: Home / Self Care | Payer: Medicaid Other | Source: Ambulatory Visit | Attending: Adult Health | Admitting: Adult Health

## 2023-03-05 ENCOUNTER — Ambulatory Visit (INDEPENDENT_AMBULATORY_CARE_PROVIDER_SITE_OTHER): Payer: Medicaid Other | Admitting: Adult Health

## 2023-03-05 ENCOUNTER — Encounter: Payer: Self-pay | Admitting: Adult Health

## 2023-03-05 VITALS — BP 130/80 | HR 84 | Ht 64.0 in | Wt 291.8 lb

## 2023-03-05 DIAGNOSIS — N921 Excessive and frequent menstruation with irregular cycle: Secondary | ICD-10-CM | POA: Diagnosis not present

## 2023-03-05 DIAGNOSIS — F419 Anxiety disorder, unspecified: Secondary | ICD-10-CM | POA: Diagnosis not present

## 2023-03-05 DIAGNOSIS — Z01419 Encounter for gynecological examination (general) (routine) without abnormal findings: Secondary | ICD-10-CM | POA: Diagnosis present

## 2023-03-05 DIAGNOSIS — G43109 Migraine with aura, not intractable, without status migrainosus: Secondary | ICD-10-CM | POA: Diagnosis not present

## 2023-03-05 DIAGNOSIS — F32A Depression, unspecified: Secondary | ICD-10-CM

## 2023-03-05 DIAGNOSIS — D500 Iron deficiency anemia secondary to blood loss (chronic): Secondary | ICD-10-CM

## 2023-03-05 MED ORDER — NORETHINDRONE 0.35 MG PO TABS: 1.0000 | ORAL_TABLET | Freq: Every day | ORAL | 11 refills | Status: AC

## 2023-03-05 MED ORDER — HYDROXYZINE HCL 10 MG PO TABS
10.0000 mg | ORAL_TABLET | Freq: Three times a day (TID) | ORAL | 1 refills | Status: DC | PRN
Start: 2023-03-05 — End: 2023-04-12

## 2023-03-05 NOTE — Addendum Note (Signed)
Addended by: Freddie Apley R on: 03/05/2023 11:40 AM   Modules accepted: Orders

## 2023-03-05 NOTE — Progress Notes (Signed)
Patient ID: Linda Skinner, female   DOB: 1990-10-10, 32 y.o.   MRN: [id number] History of Present Illness: Linda Skinner is a 32 year old black female,single, [id number], in for a well woman gyn exam and pap. She has having heavy periods, and they may come a week early or a week late. They last 3-4 days and about 3 are heavy, may change pad every 2 hours. Has more anxiety. She sees Dr Fransico Him for thyroid.   PCP is Dr Allena Katz   Current Medications, Allergies, Past Medical History, Past Surgical History, Family History and Social History were reviewed in [employer] electronic medical record.     Review of Systems: Patient denies any  hearing loss, fatigue, blurred vision, shortness of breath, chest pain(had 1 episode), abdominal pain, problems with bowel movements, urination, or intercourse(not active). No joint pain or mood swings. Has migraines with aura. See HPI for positives   Physical Exam:BP 130/80 (BP Location: Left Arm, Patient Position: Sitting, Cuff Size: Normal) Comment (BP Location): forearm  Pulse 84   Ht 5\' 4"  (1.626 m)   Wt 291 lb 12.8 oz (132.4 kg)   LMP 02/22/2023 (Exact Date)   BMI 50.09 kg/m   General:  Well developed, well nourished, no acute distress Skin:  Warm and dry Neck:  Midline trachea, normal thyroid, good ROM, no lymphadenopathy Lungs; Clear to auscultation bilaterally Breast:  No dominant palpable mass, retraction, or nipple discharge,has righ nipple rod Cardiovascular: Regular rate and rhythm Abdomen:  Soft, non tender, no hepatosplenomegaly Pelvic:  External genitalia is normal in appearance, no lesions.  The vagina is normal in appearance. Urethra has no lesions or masses. The cervix is bulbous.pap with HR HPV genotyping performed.  Uterus is felt to be normal size, shape, and contour.  No adnexal masses or tenderness noted.Bladder is non tender, no masses felt. Extremities/musculoskeletal:  No swelling or varicosities noted, no clubbing or cyanosis Psych:  No mood  changes, alert and cooperative,seems happy AA is 2 Fall risk is low    03/05/2023    8:53 AM 10/14/2022    3:42 PM 07/16/2022    4:10 PM  Depression screen PHQ 2/9  Decreased Interest 0 0 2  Down, Depressed, Hopeless 2 0 3  PHQ - 2 Score 2 0 5  Altered sleeping 3  2  Tired, decreased energy 1  2  Change in appetite 0  3  Feeling bad or failure about yourself  1  3  Trouble concentrating 1  2  Moving slowly or fidgety/restless 0  0  Suicidal thoughts 0  0  PHQ-9 Score 8  17  Difficult doing work/chores   Somewhat difficult       03/05/2023    8:53 AM 07/16/2022    4:10 PM 06/11/2022    1:47 PM 06/02/2022    4:21 PM  GAD 7 : Generalized Anxiety Score  Nervous, Anxious, on Edge 2 3 3 3   Control/stop worrying 3 3 3 3   Worry too much - different things 3 3 3 3   Trouble relaxing 2 2 3 3   Restless 1 0 1 1  Easily annoyed or irritable 3 3 3 3   Afraid - awful might happen 0 3 3 3   Total GAD 7 Score 14 17 19 19   Anxiety Difficulty  Somewhat difficult Somewhat difficult       Upstream - 03/05/23 0840       Pregnancy Intention Screening   Does the patient want to become pregnant in the next year?  No    Would the patient like to discuss contraceptive options today? No      Contraception Wrap Up   Current Method Female Sterilization   tubal   End Method Female Sterilization    Contraception Counseling Provided No             Examination chaperoned by Freddie Apley RN    Impression and plan: 1. Encounter for gynecological examination with Papanicolaou smear of cervix Pap sent Pap in 3 years if normal Physical in 1 year  2. Menorrhagia with irregular cycle Periods heavy for about 3 days, may change pad every 2 hours  Period may come a week late or a week early Will try micronor to see if helps, can start Sunday Meds ordered this encounter  Medications   hydrOXYzine (ATARAX) 10 MG tablet    Sig: Take 1 tablet (10 mg total) by mouth 3 (three) times daily as needed.     Dispense:  30 tablet    Refill:  1    Order Specific Question:   Supervising Provider    Answer:   Duane Lope H [[id number]]   norethindrone (MICRONOR) 0.35 MG tablet    Sig: Take 1 tablet (0.35 mg total) by mouth daily.    Dispense:  28 tablet    Refill:  11    Order Specific Question:   Supervising Provider    Answer:   Despina Hidden, LUTHER H [[id number]]     3. Anxiety and depression On Wellbutrin but feels anxious at times Will add vistaril prn   4. Migraine with aura and without status migrainosus, not intractable  5. Iron deficiency anemia due to chronic blood loss Her HGB was 10 in May  Takes MV with iron Eat more iron rich foods Will check CBC and iron panel in December  - CBC - Iron, TIBC and Ferritin Panel

## 2023-03-08 ENCOUNTER — Ambulatory Visit: Payer: Medicaid Other

## 2023-03-08 LAB — QUANTIFERON-TB GOLD PLUS
QuantiFERON Nil Value: 1.62 [IU]/mL
QuantiFERON TB1 Ag Value: 0.03 [IU]/mL
QuantiFERON TB2 Ag Value: 0.03 [IU]/mL

## 2023-03-09 ENCOUNTER — Other Ambulatory Visit: Payer: Self-pay | Admitting: Adult Health

## 2023-03-09 LAB — CYTOLOGY - PAP
Adequacy: ABSENT
Comment: NEGATIVE
Diagnosis: NEGATIVE
High risk HPV: NEGATIVE

## 2023-03-09 MED ORDER — METRONIDAZOLE 500 MG PO TABS: 500.0000 mg | ORAL_TABLET | Freq: Two times a day (BID) | ORAL | 0 refills | Status: DC

## 2023-04-10 ENCOUNTER — Other Ambulatory Visit: Payer: Self-pay | Admitting: Adult Health

## 2023-04-10 ENCOUNTER — Other Ambulatory Visit: Payer: Self-pay | Admitting: "Endocrinology

## 2023-04-10 DIAGNOSIS — E0500 Thyrotoxicosis with diffuse goiter without thyrotoxic crisis or storm: Secondary | ICD-10-CM

## 2023-04-19 ENCOUNTER — Ambulatory Visit: Payer: Medicaid Other | Admitting: Internal Medicine

## 2023-04-19 ENCOUNTER — Encounter: Payer: Self-pay | Admitting: Internal Medicine

## 2023-04-19 VITALS — BP 133/84 | HR 82 | Ht 64.0 in | Wt 295.2 lb

## 2023-04-19 DIAGNOSIS — G43109 Migraine with aura, not intractable, without status migrainosus: Secondary | ICD-10-CM

## 2023-04-19 DIAGNOSIS — E890 Postprocedural hypothyroidism: Secondary | ICD-10-CM

## 2023-04-19 DIAGNOSIS — F331 Major depressive disorder, recurrent, moderate: Secondary | ICD-10-CM

## 2023-04-19 DIAGNOSIS — R7303 Prediabetes: Secondary | ICD-10-CM

## 2023-04-19 MED ORDER — SUMATRIPTAN SUCCINATE 100 MG PO TABS
100.0000 mg | ORAL_TABLET | ORAL | 2 refills | Status: DC | PRN
Start: 2023-04-19 — End: 2023-08-18

## 2023-04-19 MED ORDER — TOPIRAMATE 25 MG PO TABS
25.0000 mg | ORAL_TABLET | Freq: Two times a day (BID) | ORAL | 3 refills | Status: DC
Start: 2023-04-19 — End: 2023-08-18

## 2023-04-19 NOTE — Assessment & Plan Note (Signed)
Longstanding history, has tried OTC Tylenol, ibuprofen and other migraine medicines Started sumatriptan 100 mg as needed for breakthrough headache Considering her frequency of migraine, started topiramate 25 mg QD for 1 week and then twice daily

## 2023-04-19 NOTE — Patient Instructions (Signed)
Please start taking Topiramate once daily for 1 week and then twice daily.  Please take Sumatriptan as needed for breakthrough headache. Okay to repeat dose after 2 hours, maximum 2 tablets in a day.  Please continue to take other medications as prescribed.  Please continue to follow low carb diet and perform moderate exercise/walking at least 150 mins/week.

## 2023-04-19 NOTE — Assessment & Plan Note (Signed)
Lab Results  Component Value Date   HGBA1C 5.8 02/03/2023   Advised to follow low carb diet On Metformin 500 mg QD

## 2023-04-19 NOTE — Assessment & Plan Note (Addendum)
    04/19/2023    3:51 PM 03/05/2023    8:53 AM 10/14/2022    3:42 PM  PHQ9 SCORE ONLY  PHQ-9 Total Score 13 8 0   Uncontrolled with Wellbutrin 100 mg QD Had added Wellbutrin 100 mg BID -needs to take it BID, symptoms had improved with BID dosing in the past Has seasonal variations

## 2023-04-19 NOTE — Assessment & Plan Note (Signed)
Emphasized on diet modification and moderate exercise Had lengthy discussion about healthy diet She would be a good candidate for GLP-1 agonist therapy, she recently started Metformin - will discuss later about [facility]

## 2023-04-19 NOTE — Progress Notes (Signed)
Established Patient Office Visit  Subjective:  Patient ID: Linda Skinner, female    DOB: [birth year]  Age: 32 y.o. MRN: [id number]  CC:  Chief Complaint  Patient presents with   Migraine    Frequent migraines   Depression    Six month follow up     HPI Linda Skinner is a 32 y.o. female with past medical history of Grave's disease and major depressive disorder who presents for f/u of her chronic medical conditions.  Migraine: She reports unilateral headache, lasting for few hours, recurrent, about 8-10 episodes in a month, and associated with photophobia and nausea.  She has tried Tylenol, ibuprofen and other OTC migraine medicines without much relief.  She has felt better after going in a dark room or spell of vomiting.  She also reports blurry vision and floaters at times.  She has family history of migraine as well.  Hypothyroidism: She had radioactive iodine therapy for Graves' disease in 04/2020.  She is on Levothyroxine 25 mcg QD.   Depression: She has been taking Wellbutrin 100 mg QD instead of twice daily for depression and has noticed worsening of her symptoms recently. She had been stressed due to her grandmother's cancer diagnosis in the last visit. She was nervous around her mother's death anniversary.  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 Denies any SI or HI currently.   Morbid obesity: She has been struggling to lose weight.  She admits that she has improved her diet and has been trying intermittent fasting. She agrees to strictly follow low-carb diet for now.  She has been placed on metformin for prediabetes by endocrinology.    Past Medical History:  Diagnosis Date   BV (bacterial vaginosis) 11/12/2021   11/12/21 rx flagyl   Encounter for tubal ligation 09/16/2017   Gestational diabetes    metformin   Graves disease    Maternal varicella, non-immune 02/18/2017   Immunize p delivery   Medical history  non-contributory    Supervision of high risk pregnancy, antepartum 02/17/2017    [facility] at  [facility] FOB [facility] [place] Dating By LMP c/w 1st trimester U/S 7wk Pap 05/23/15 neg [facility] (got records) GC/CT Initial:   -/-             36+wks:-/- Genetic Screen NT/IT: neg CF screen declined Anatomic [place] Normal female Flu vaccine 02/17/17  Tdap Recommended ~ 28wks Glucose Screen  2 hr   99/122/114  GDM GBS pos Feed Preference both Contraception BTL, consent 02/06/1   SVD (spontaneous vaginal delivery) 09/15/2017    Past Surgical History:  Procedure Laterality Date   MOUTH SURGERY     TUBAL LIGATION N/A 09/15/2017   Procedure: POST PARTUM TUBAL LIGATION;  Surgeon: Kathrynn Running, MD;  Location: [facility] BIRTHING SUIT[facility];  Service: Gynecology;  Laterality: N/A;   WISDOM TOOTH EXTRACTION      Family History  Problem Relation Age of Onset   Cancer Paternal Grandfather    Cancer Maternal Grandmother        ovarian   Breast cancer Maternal Grandmother    Atrial fibrillation Maternal Grandmother    Cancer Father    Lupus Mother     Social History   Socioeconomic History   Marital status: Single    Spouse name: Not on file   Number of children: 4   Years of education: Not on file   Highest education level: 12th  grade  Occupational History   Not on file  Tobacco Use   Smoking status: Former    Current packs/day: 0.01    Types: Cigars, Cigarettes   Smokeless tobacco: Never  Vaping Use   Vaping status: Never Used  Substance and Sexual Activity   Alcohol use: No   Drug use: No   Sexual activity: Not Currently    Birth control/protection: Surgical    Comment: tubal  Other Topics Concern   Not on file  Social History Narrative   Not on file   Social Determinants of Health   Financial Resource Strain: Low Risk  (04/16/2023)   Overall Financial Resource Strain (CARDIA)    Difficulty of Paying Living Expenses: Not hard at all  Food Insecurity: No Food Insecurity  (04/16/2023)   Hunger Vital Sign    Worried About Running Out of Food in the Last Year: Never true    Ran Out of Food in the Last Year: Never true  Transportation Needs: No Transportation Needs (04/16/2023)   PRAPARE - Administrator, Civil Service (Medical): No    Lack of Transportation (Non-Medical): No  Physical Activity: Sufficiently Active (04/16/2023)   Exercise Vital Sign    Days of Exercise per Week: 5 days    Minutes of Exercise per Session: 150+ min  Recent Concern: Physical Activity - Insufficiently Active (03/05/2023)   Exercise Vital Sign    Days of Exercise per Week: 5 days    Minutes of Exercise per Session: 10 min  Stress: Stress Concern Present (04/16/2023)   [name]    Feeling of Stress : To some extent  Social Connections: Socially Isolated (04/16/2023)   Social Connection and Isolation Panel [NHANES]    Frequency of Communication with Friends and Family: More than three times a week    Frequency of Social Gatherings with Friends and Family: Never    Attends Religious Services: Never    Database administrator or Organizations: No    Attends Banker Meetings: Never    Marital Status: Never married  Intimate Partner Violence: Not At Risk (03/05/2023)   Humiliation, Afraid, Rape, and Kick questionnaire    Fear of Current or Ex-Partner: No    Emotionally Abused: No    Physically Abused: No    Sexually Abused: No    Outpatient Medications Prior to Visit  Medication Sig Dispense Refill   buPROPion ER (WELLBUTRIN SR) 100 MG 12 hr tablet Take 1 tablet (100 mg total) by mouth 2 (two) times daily. 60 tablet 5   hydrOXYzine (ATARAX) 10 MG tablet Take 1 tablet (10 mg total) by mouth 3 (three) times daily as needed. 30 tablet 3   levothyroxine (SYNTHROID) 25 MCG tablet TAKE 1 TABLET DAILY BEFORE BREAKFAST. 90 tablet 1   metFORMIN (GLUCOPHAGE-XR) 500 MG 24 hr tablet Take 1 tablet  (500 mg total) by mouth daily with breakfast. 90 tablet 1   norethindrone (MICRONOR) 0.35 MG tablet Take 1 tablet (0.35 mg total) by mouth daily. 28 tablet 11   OVER THE COUNTER MEDICATION Women's Multivitamin with Iron     metroNIDAZOLE (FLAGYL) 500 MG tablet Take 1 tablet (500 mg total) by mouth 2 (two) times daily. (Patient not taking: Reported on 04/19/2023) 14 tablet 0   No facility-administered medications prior to visit.    No Known Allergies  ROS Review of Systems  Constitutional:  Negative for chills and fever.  HENT:  Negative for  congestion, sinus pressure, sinus pain and sore throat.   Eyes:  Negative for pain and discharge.  Respiratory:  Negative for cough and shortness of breath.   Cardiovascular:  Negative for chest pain and palpitations.  Gastrointestinal:  Positive for nausea. Negative for abdominal pain, diarrhea and vomiting.  Endocrine: Negative for polydipsia and polyuria.  Genitourinary:  Negative for dysuria and hematuria.  Musculoskeletal:  Negative for neck pain and neck stiffness.  Skin:  Negative for rash.  Neurological:  Positive for headaches. Negative for dizziness and weakness.  Psychiatric/Behavioral:  Negative for agitation, sleep disturbance and suicidal ideas.       Objective:    Physical Exam Vitals reviewed.  Constitutional:      General: She is not in acute distress.    Appearance: She is obese. She is not diaphoretic.  HENT:     Head: Normocephalic and atraumatic.     Nose: Nose normal.     Mouth/Throat:     Mouth: Mucous membranes are moist.  Eyes:     General: No scleral icterus.    Extraocular Movements: Extraocular movements intact.  Cardiovascular:     Rate and Rhythm: Normal rate and regular rhythm.     Pulses: Normal pulses.     Heart sounds: No murmur heard. Pulmonary:     Breath sounds: Normal breath sounds. No wheezing or rales.  Musculoskeletal:     Cervical back: Normal range of motion and neck supple. No rigidity or  tenderness.     Right lower leg: No edema.     Left lower leg: No edema.  Skin:    General: Skin is warm.     Coloration: Skin is not pale.  Neurological:     General: No focal deficit present.     Mental Status: She is alert and oriented to person, place, and time.     Cranial Nerves: No cranial nerve deficit.     Sensory: No sensory deficit.     Motor: No weakness.  Psychiatric:        Mood and Affect: Mood normal.        Behavior: Behavior normal.     BP 133/84 (BP Location: Left Arm, Patient Position: Sitting, Cuff Size: Large)   Pulse 82   Ht 5\' 4"  (1.626 m)   Wt 295 lb 3.2 oz (133.9 kg)   SpO2 97%   BMI 50.67 kg/m  Wt Readings from Last 3 Encounters:  04/19/23 295 lb 3.2 oz (133.9 kg)  03/05/23 291 lb 12.8 oz (132.4 kg)  02/03/23 296 lb (134.3 kg)    Lab Results  Component Value Date   TSH 2.670 01/27/2023   Lab Results  Component Value Date   WBC 6.1 10/14/2022   HGB 10.0 (L) 10/14/2022   HCT 32.0 (L) 10/14/2022   MCV 82 10/14/2022   PLT 287 10/14/2022   Lab Results  Component Value Date   NA 137 10/14/2022   K 4.1 10/14/2022   CO2 21 10/14/2022   GLUCOSE 90 10/14/2022   BUN 9 10/14/2022   CREATININE 0.76 10/14/2022   BILITOT <0.2 10/14/2022   ALKPHOS 51 10/14/2022   AST 19 10/14/2022   ALT 12 10/14/2022   PROT 7.1 10/14/2022   ALBUMIN 4.3 10/14/2022   CALCIUM 9.3 10/14/2022   ANIONGAP 9 05/05/2018   EGFR 107 10/14/2022   Lab Results  Component Value Date   CHOL 155 07/28/2022   Lab Results  Component Value Date   HDL 51 07/28/2022  Lab Results  Component Value Date   LDLCALC 88 07/28/2022   Lab Results  Component Value Date   TRIG 86 07/28/2022   Lab Results  Component Value Date   CHOLHDL 3.0 07/28/2022   Lab Results  Component Value Date   HGBA1C 5.8 02/03/2023      Assessment & Plan:   Problem List Items Addressed This Visit       Cardiovascular and Mediastinum   Migraine with aura and without status migrainosus,  not intractable - Primary    Longstanding history, has tried OTC Tylenol, ibuprofen and other migraine medicines Started sumatriptan 100 mg as needed for breakthrough headache Considering her frequency of migraine, started topiramate 25 mg QD for 1 week and then twice daily      Relevant Medications   topiramate (TOPAMAX) 25 MG tablet   SUMAtriptan (IMITREX) 100 MG tablet     Endocrine   Hypothyroidism    Lab Results  Component Value Date   TSH 2.670 01/27/2023   S/p radioactive ablation On Levothyroxine 25 mcg QD Followed by Endocrinology        Other   Depression, major, recurrent, moderate (HCC)       04/19/2023    3:51 PM 03/05/2023    8:53 AM 10/14/2022    3:42 PM  PHQ9 SCORE ONLY  PHQ-9 Total Score 13 8 0   Uncontrolled with Wellbutrin 100 mg QD Had added Wellbutrin 100 mg BID -needs to take it BID, symptoms had improved with BID dosing in the past Has seasonal variations      Morbid obesity (HCC)    Emphasized on diet modification and moderate exercise Had lengthy discussion about healthy diet She would be a good candidate for GLP-1 agonist therapy, she recently started Metformin - will discuss later about [facility]      Prediabetes    Lab Results  Component Value Date   HGBA1C 5.8 02/03/2023   Advised to follow low carb diet On Metformin 500 mg QD       Meds ordered this encounter  Medications   topiramate (TOPAMAX) 25 MG tablet    Sig: Take 1 tablet (25 mg total) by mouth 2 (two) times daily.    Dispense:  60 tablet    Refill:  3   SUMAtriptan (IMITREX) 100 MG tablet    Sig: Take 1 tablet (100 mg total) by mouth every 2 (two) hours as needed for migraine. May repeat in 2 hours if headache persists or recurs.    Dispense:  10 tablet    Refill:  2    Follow-up: Return in about 4 months (around 08/17/2023) for Migraine.    Anabel Halon, MD

## 2023-04-19 NOTE — Assessment & Plan Note (Signed)
Lab Results  Component Value Date   TSH 2.670 01/27/2023   S/p radioactive ablation On Levothyroxine 25 mcg QD Followed by Endocrinology

## 2023-05-29 LAB — IRON,TIBC AND FERRITIN PANEL
Ferritin: 22 ng/mL (ref 15–150)
Iron Saturation: 14 % — ABNORMAL LOW (ref 15–55)
Iron: 46 ug/dL (ref 27–159)
Total Iron Binding Capacity: 331 ug/dL (ref 250–450)
UIBC: 285 ug/dL (ref 131–425)

## 2023-05-29 LAB — CBC
Hematocrit: 34.1 % (ref 34.0–46.6)
Hemoglobin: 10.8 g/dL — ABNORMAL LOW (ref 11.1–15.9)
MCH: 27 pg (ref 26.6–33.0)
MCHC: 31.7 g/dL (ref 31.5–35.7)
MCV: 85 fL (ref 79–97)
Platelets: 321 10*3/uL (ref 150–450)
RBC: 4 x10E6/uL (ref 3.77–5.28)
RDW: 13.6 % (ref 11.7–15.4)
WBC: 5.6 10*3/uL (ref 3.4–10.8)

## 2023-06-04 ENCOUNTER — Encounter: Payer: Self-pay | Admitting: Adult Health

## 2023-06-04 ENCOUNTER — Ambulatory Visit: Payer: Medicaid Other | Admitting: Adult Health

## 2023-06-04 VITALS — BP 119/75 | HR 73 | Ht 64.0 in | Wt 289.0 lb

## 2023-06-04 DIAGNOSIS — F419 Anxiety disorder, unspecified: Secondary | ICD-10-CM | POA: Diagnosis not present

## 2023-06-04 DIAGNOSIS — F32A Depression, unspecified: Secondary | ICD-10-CM

## 2023-06-04 DIAGNOSIS — D500 Iron deficiency anemia secondary to blood loss (chronic): Secondary | ICD-10-CM

## 2023-06-04 DIAGNOSIS — N921 Excessive and frequent menstruation with irregular cycle: Secondary | ICD-10-CM | POA: Diagnosis not present

## 2023-06-04 MED ORDER — HYDROXYZINE HCL 10 MG PO TABS: 10.0000 mg | ORAL_TABLET | Freq: Three times a day (TID) | ORAL | 3 refills | Status: AC | PRN

## 2023-06-04 MED ORDER — IRON 325 (65 FE) MG PO TABS: 1.0000 | ORAL_TABLET | Freq: Every day | ORAL | Status: AC

## 2023-06-04 NOTE — Progress Notes (Signed)
 Subjective:     Patient ID: Linda Skinner, female   DOB: 1990/11/07, 33 y.o.   MRN: [id number]  HPI Lonetta is a 33 year old black female,single, [id number] back in follow up on starting Micronor  to control periods and adding vistaril  to Wellbutrin . Bleeding is better and thinks the vistaril  helps.  Hgb was 10.8 05/28/23 and Ferritin level was 22.      Component Value Date/Time   DIAGPAP  03/05/2023 1140    - Negative for intraepithelial lesion or malignancy (NILM)   DIAGPAP  05/01/2020 1204    - Negative for intraepithelial lesion or malignancy (NILM)   HPVHIGH Negative 03/05/2023 1140   HPVHIGH Negative 05/01/2020 1204   ADEQPAP  03/05/2023 1140    Satisfactory for evaluation; transformation zone component ABSENT.   ADEQPAP  05/01/2020 1204    Satisfactory for evaluation; transformation zone component PRESENT.   PCP is Dr Tobie   Review of Systems Periods better Still with some anxiety but vistaril  helps Reviewed past medical,surgical, social and family history. Reviewed medications and allergies.     Objective:   Physical Exam BP 119/75 (BP Location: Left Arm, Patient Position: Sitting, Cuff Size: Large)   Pulse 73   Ht 5' 4 (1.626 m)   Wt 289 lb (131.1 kg)   LMP 05/25/2023   BMI 49.61 kg/m     Skin warm and dry. Lungs: clear to ausculation bilaterally. Cardiovascular: regular rate and rhythm.     06/04/2023   11:24 AM 04/19/2023    3:51 PM 03/05/2023    8:53 AM  Depression screen PHQ 2/9  Decreased Interest 1 2 0  Down, Depressed, Hopeless 2 1 2   PHQ - 2 Score 3 3 2   Altered sleeping 3 3 3   Tired, decreased energy 1 3 1   Change in appetite 3 2 0  Feeling bad or failure about yourself  1 1 1   Trouble concentrating 2 1 1   Moving slowly or fidgety/restless 0 0 0  Suicidal thoughts 0 0 0  PHQ-9 Score 13 13 8        06/04/2023   11:25 AM 04/19/2023    3:51 PM 03/05/2023    8:53 AM 07/16/2022    4:10 PM  GAD 7 : Generalized Anxiety Score  Nervous, Anxious, on Edge 1 2  2 3   Control/stop worrying 3 3 3 3   Worry too much - different things 2 2 3 3   Trouble relaxing 1 2 2 2   Restless 1 0 1 0  Easily annoyed or irritable 3 3 3 3   Afraid - awful might happen 1 0 0 3  Total GAD 7 Score 12 12 14 17   Anxiety Difficulty    Somewhat difficult      Upstream - 06/04/23 1123       Pregnancy Intention Screening   Does the patient want to become pregnant in the next year? No    Does the patient's partner want to become pregnant in the next year? No    Would the patient like to discuss contraceptive options today? No      Contraception Wrap Up   Current Method Female Sterilization;Oral Contraceptive    End Method Female Sterilization;Oral Contraceptive    Contraception Counseling Provided Yes             Assessment:     1. Menorrhagia with irregular cycle (Primary) Periods better on Micronor , has refills  2. Anxiety and depression On Wellbutrin  from PCP and taking vistaril  prn  Says still  anxious, but does not want any more pills, just refill vistaril  Meds ordered this encounter  Medications   hydrOXYzine  (ATARAX ) 10 MG tablet    Sig: Take 1 tablet (10 mg total) by mouth 3 (three) times daily as needed.    Dispense:  60 tablet    Refill:  3    This prescription was filled on 04/10/2023. Any refills authorized will be placed on file.    Supervising Provider:   JAYNE VONN DEL [[id number]]   Ferrous Sulfate  (IRON ) 325 (65 Fe) MG TABS    Sig: Take 1 tablet (325 mg total) by mouth daily.    Supervising Provider:   JAYNE VONN H [[id number]]     3. Iron  deficiency anemia due to chronic blood loss Add iron  every day to MV     Plan:     Follow up in 4 months or sooner if needed

## 2023-07-29 LAB — T4, FREE: Free T4: 0.95 ng/dL (ref 0.82–1.77)

## 2023-07-29 LAB — LIPID PANEL
Chol/HDL Ratio: 3.6 {ratio} (ref 0.0–4.4)
Cholesterol, Total: 171 mg/dL (ref 100–199)
HDL: 47 mg/dL (ref >39)
LDL Chol Calc (NIH): 112 mg/dL — ABNORMAL HIGH (ref 0–99)
Triglycerides: 63 mg/dL (ref 0–149)
VLDL Cholesterol Cal: 12 mg/dL (ref 5–40)

## 2023-07-29 LAB — TSH: TSH: 5.19 u[IU]/mL — ABNORMAL HIGH (ref 0.450–4.500)

## 2023-08-04 ENCOUNTER — Encounter: Payer: Self-pay | Admitting: "Endocrinology

## 2023-08-04 ENCOUNTER — Ambulatory Visit: Payer: Medicaid Other | Admitting: "Endocrinology

## 2023-08-04 VITALS — BP 124/80 | HR 76 | Ht 64.0 in | Wt 286.2 lb

## 2023-08-04 DIAGNOSIS — E890 Postprocedural hypothyroidism: Secondary | ICD-10-CM | POA: Diagnosis not present

## 2023-08-04 DIAGNOSIS — E782 Mixed hyperlipidemia: Secondary | ICD-10-CM

## 2023-08-04 DIAGNOSIS — E0500 Thyrotoxicosis with diffuse goiter without thyrotoxic crisis or storm: Secondary | ICD-10-CM

## 2023-08-04 DIAGNOSIS — E559 Vitamin D deficiency, unspecified: Secondary | ICD-10-CM

## 2023-08-04 DIAGNOSIS — R7303 Prediabetes: Secondary | ICD-10-CM

## 2023-08-04 LAB — POCT GLYCOSYLATED HEMOGLOBIN (HGB A1C): HbA1c, POC (prediabetic range): 5.8 % (ref 5.7–6.4)

## 2023-08-04 MED ORDER — LEVOTHYROXINE SODIUM 50 MCG PO TABS
50.0000 ug | ORAL_TABLET | Freq: Every day | ORAL | 1 refills | Status: DC
Start: 2023-08-04 — End: 2024-03-27

## 2023-08-04 NOTE — Patient Instructions (Signed)

## 2023-08-04 NOTE — Progress Notes (Signed)
 08/04/2023    Endocrinology follow-up note  Subjective:    Patient ID: Linda Skinner, female    DOB: 10-08-1990, PCP Anabel Halon, MD.   Past Medical History:  Diagnosis Date   BV (bacterial vaginosis) 11/12/2021   11/12/21 rx flagyl   Encounter for tubal ligation 09/16/2017   Gestational diabetes    metformin   Graves disease    Maternal varicella, non-immune 02/18/2017   Immunize p delivery   Medical history non-contributory    Supervision of high risk pregnancy, antepartum 02/17/2017    [facility] at  [facility] FOB [place] Dating By LMP c/w 1st trimester U/S 7wk Pap 05/23/15 neg [facility] (got records) GC/CT Initial:   -/-             36+wks:-/- Genetic Screen NT/IT: neg CF screen declined Anatomic [place] Normal female Flu vaccine 02/17/17  Tdap Recommended ~ 28wks Glucose Screen  2 hr   99/122/114  GDM GBS pos Feed Preference both Contraception BTL, consent 02/06/1   SVD (spontaneous vaginal delivery) 09/15/2017    Past Surgical History:  Procedure Laterality Date   MOUTH SURGERY     TUBAL LIGATION N/A 09/15/2017   Procedure: POST PARTUM TUBAL LIGATION;  Surgeon: Kathrynn Running, MD;  Location: [facility] BIRTHING SUITES;  Service: Gynecology;  Laterality: N/A;   WISDOM TOOTH EXTRACTION      Social History   Socioeconomic History   Marital status: Single    Spouse name: Not on file   Number of children: 4   Years of education: Not on file   Highest education level: 12th grade  Occupational History   Not on file  Tobacco Use   Smoking status: Former    Current packs/day: 0.01    Types: Cigars, Cigarettes   Smokeless tobacco: Never  Vaping Use   Vaping status: Never Used  Substance and Sexual Activity   Alcohol use: No   Drug use: No   Sexual activity: Not Currently    Birth control/protection: Surgical    Comment: tubal  Other Topics Concern   Not on file  Social History Narrative   Not on file   Social Drivers of Health   Financial  Resource Strain: Low Risk  (04/16/2023)   Overall Financial Resource Strain (CARDIA)    Difficulty of Paying Living Expenses: Not hard at all  Food Insecurity: No Food Insecurity (04/16/2023)   Hunger Vital Sign    Worried About Running Out of Food in the Last Year: Never true    Ran Out of Food in the Last Year: Never true  Transportation Needs: No Transportation Needs (04/16/2023)   PRAPARE - Administrator, Civil Service (Medical): No    Lack of Transportation (Non-Medical): No  Physical Activity: Sufficiently Active (04/16/2023)   Exercise Vital Sign    Days of Exercise per Week: 5 days    Minutes of Exercise per Session: 150+ min  Recent Concern: Physical Activity - Insufficiently Active (03/05/2023)   Exercise Vital Sign    Days of Exercise per Week: 5 days    Minutes of Exercise per Session: 10 min  Stress: Stress Concern Present (04/16/2023)   [name]    Feeling of Stress : To some extent  Social Connections: Socially Isolated (04/16/2023)   Social Connection and Isolation Panel [NHANES]    Frequency of Communication with Friends and Family: More than three times a week  Frequency of Social Gatherings with Friends and Family: Never    Attends Religious Services: Never    Database administrator or Organizations: No    Attends Engineer, structural: Never    Marital Status: Never married    Family History  Problem Relation Age of Onset   Cancer Paternal Grandfather    Cancer Maternal Grandmother        ovarian   Breast cancer Maternal Grandmother    Atrial fibrillation Maternal Grandmother    Cancer Father    Lupus Mother     Outpatient Encounter Medications as of 08/04/2023  Medication Sig   buPROPion ER (WELLBUTRIN SR) 100 MG 12 hr tablet Take 1 tablet (100 mg total) by mouth 2 (two) times daily.   Ferrous Sulfate (IRON) 325 (65 Fe) MG TABS Take 1 tablet (325 mg total) by mouth  daily.   hydrOXYzine (ATARAX) 10 MG tablet Take 1 tablet (10 mg total) by mouth 3 (three) times daily as needed.   levothyroxine (SYNTHROID) 50 MCG tablet Take 1 tablet (50 mcg total) by mouth daily before breakfast.   metFORMIN (GLUCOPHAGE-XR) 500 MG 24 hr tablet Take 1 tablet (500 mg total) by mouth daily with breakfast.   norethindrone (MICRONOR) 0.35 MG tablet Take 1 tablet (0.35 mg total) by mouth daily.   OVER THE COUNTER MEDICATION Women's Multivitamin with Iron   SUMAtriptan (IMITREX) 100 MG tablet Take 1 tablet (100 mg total) by mouth every 2 (two) hours as needed for migraine. May repeat in 2 hours if headache persists or recurs.   topiramate (TOPAMAX) 25 MG tablet Take 1 tablet (25 mg total) by mouth 2 (two) times daily.   [DISCONTINUED] levothyroxine (SYNTHROID) 25 MCG tablet TAKE 1 TABLET DAILY BEFORE BREAKFAST.   No facility-administered encounter medications on file as of 08/04/2023.    ALLERGIES: No Known Allergies  VACCINATION STATUS: Immunization History  Administered Date(s) Administered   Influenza,inj,Quad PF,6+ Mos 02/17/2017, 03/01/2020, 04/15/2021   Influenza-Unspecified 03/01/2022, 02/10/2023   PFIZER(Purple Top)SARS-COV-2 Vaccination 02/07/2020, 03/01/2020   PPD Test 03/11/2020, 03/25/2020   Tdap 09/16/2017     HPI  Linda Skinner is 33 y.o. female who presents today with her new labs and uptake and scan of her thyroid.    she was recently seen in consultation for hyperthyroidism requested by Anabel Halon, MD. 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  She is currently on levothyroxine 25 mcg p.o. daily before breakfast.  Her previsit labs are consistent with under-replacement.     She reports compliance with medication.  She has no new  complaints today, presents with steady weight since last visit.  She maintained the changes she made on her lifestyle.  She is tolerating low-dose metformin given to her due to prediabetes.   She denies palpitations, tremor, nor heat intolerance at this time.  she denies family history of thyroid dysfunction. -She denies family hx of thyroid cancer. she denies personal history of goiter. she  is willing to proceed with appropriate work up and therapy for thyrotoxicosis. -She also has prediabetes with point-of-care A1c of 5.8% today.  She presents with 10 pounds of weight loss since last visit.                            Review of  systems  Constitutional: + Weight gain, + fatigue.      Objective:    BP 124/80   Pulse 76   Ht 5\' 4"  (1.626 m)   Wt 286 lb 3.2 oz (129.8 kg)   BMI 49.13 kg/m   Wt Readings from Last 3 Encounters:  08/04/23 286 lb 3.2 oz (129.8 kg)  06/04/23 289 lb (131.1 kg)  04/19/23 295 lb 3.2 oz (133.9 kg)                                                Physical exam  Constitutional: Body mass index is 49.13 kg/m., not in acute distress, + normal state of mind   CMP     Component Value Date/Time   NA 137 10/14/2022 1612   K 4.1 10/14/2022 1612   CL 103 10/14/2022 1612   CO2 21 10/14/2022 1612   GLUCOSE 90 10/14/2022 1612   GLUCOSE 108 (H) 05/05/2018 0750   BUN 9 10/14/2022 1612   CREATININE 0.76 10/14/2022 1612   CALCIUM 9.3 10/14/2022 1612   PROT 7.1 10/14/2022 1612   ALBUMIN 4.3 10/14/2022 1612   AST 19 10/14/2022 1612   ALT 12 10/14/2022 1612   ALKPHOS 51 10/14/2022 1612   BILITOT <0.2 10/14/2022 1612   GFRNONAA 118 03/08/2020 0919   GFRAA 136 03/08/2020 0919     CBC    Component Value Date/Time   WBC 5.6 05/28/2023 1303   WBC 10.3 05/05/2018 0750   RBC 4.00 05/28/2023 1303   RBC 4.48 05/05/2018 0750   HGB 10.8 (L) 05/28/2023 1303   HCT 34.1 05/28/2023 1303   PLT 321 05/28/2023 1303   MCV 85 05/28/2023 1303   MCH 27.0 05/28/2023  1303   MCH 25.4 (L) 05/05/2018 0750   MCHC 31.7 05/28/2023 1303   MCHC 31.8 05/05/2018 0750   RDW 13.6 05/28/2023 1303   LYMPHSABS 3.3 (H) 10/14/2022 1612   MONOABS 0.9 05/05/2018 0750   EOSABS 0.1 10/14/2022 1612   BASOSABS 0.0 10/14/2022 1612    Diabetic Labs (most recent): Lab Results  Component Value Date   HGBA1C 5.8 08/04/2023   HGBA1C 5.8 02/03/2023   HGBA1C 5.7 08/03/2022   MICROALBUR 13.7 (H) 03/13/2020    Lipid Panel     Component Value Date/Time   CHOL 171 07/28/2023 1322   TRIG 63 07/28/2023 1322   HDL 47 07/28/2023 1322   CHOLHDL 3.6 07/28/2023 1322   LDLCALC 112 (H) 07/28/2023 1322   LABVLDL 12 07/28/2023 1322     Lab Results  Component Value Date   TSH 5.190 (H) 07/28/2023   TSH 2.670 01/27/2023   TSH 1.800 07/28/2022   TSH 0.070 (L) 03/26/2022   TSH 0.028 (L) 12/25/2021   TSH 0.022 (L) 10/08/2021   TSH 0.047 (L) 09/22/2021   TSH <0.005 (L) 06/24/2021   TSH 1.660 02/21/2021   TSH 11.900 (H) 01/13/2021   FREET4 0.95 07/28/2023   FREET4 1.08 01/27/2023   FREET4 1.11 07/28/2022   FREET4 1.39 03/26/2022   FREET4 1.33 12/25/2021   FREET4 1.55 10/08/2021   FREET4 1.44 09/22/2021   FREET4 1.91 (H) 06/24/2021   FREET4 1.16 02/21/2021   FREET4 0.74 (L) 01/13/2021    Thyroid uptake and scan on April 10, 2020 FINDINGS: Homogeneous tracer distribution in both thyroid lobes.  No focal areas of increased or decreased tracer  localization.  4 hour I-123 uptake = 23.7% (normal 5-20%)  ,  24 hour I-123 uptake = 46.9% (normal 10-30%)   IMPRESSION: Normal thyroid scan.  Elevated 4 hour and 24 hour radio iodine uptakes.  Findings consistent with Graves disease.   Assessment & Plan:   1.  RAI induced hypothyroidism 2.  Graves' disease-resolved Her previsit thyroid function tests are consistent with slight under replacement.  I discussed and increased her levothyroxine to 50 mcg p.o. daily before breakfast.     - We discussed about the correct  intake of her thyroid hormone, on empty stomach at fasting, with water, separated by at least 30 minutes from breakfast and other medications,  and separated by more than 4 hours from calcium, iron, multivitamins, acid reflux medications (PPIs). -Patient is made aware of the fact that thyroid hormone replacement is needed for life, dose to be adjusted by periodic monitoring of thyroid function tests.   In light of the fact that she has prediabetes with point-of-care A1c of 5.8% today, hyperlipidemia, morbid obesity, she remains a good candidate for lifestyle medicine.  She is advised to continue metformin 500 mg XR p.o. daily at breakfast.    - she acknowledges that there is a room for improvement in her food and drink choices. - Suggestion is made for her to avoid simple carbohydrates  from her diet including Cakes, Sweet Desserts, Ice Cream, Soda (diet and regular), Sweet Tea, Candies, Chips, Cookies, Store Bought Juices, Alcohol , Artificial Sweeteners,  Coffee Creamer, and "Sugar-free" Products, Lemonade. This will help patient to have more stable blood glucose profile and potentially avoid unintended weight gain.  The following Lifestyle Medicine recommendations according to American College of Lifestyle Medicine  [facility]) were discussed and and offered to patient and she  agrees to start the journey:  A. Whole Foods, Plant-Based Nutrition comprising of fruits and vegetables, plant-based proteins, whole-grain carbohydrates was discussed in detail with the patient.   A list for source of those nutrients were also provided to the patient.  Patient will use only water or unsweetened tea for hydration. B.  The need to stay away from risky substances including alcohol, smoking; obtaining 7 to 9 hours of restorative sleep, at least 150 minutes of moderate intensity exercise weekly, the importance of healthy social connections,  and stress management techniques were discussed. C.  A full color page of   Calorie density of various food groups per pound showing examples of each food groups was provided to the patient.    He is advised to finish and stop her vitamin D2 supplements.  She may need maintenance with vitamin D3 after next measurement.      -Patient is advised to maintain close follow up with Anabel Halon, MD for primary care needs.   I spent  25  minutes in the care of the patient today including review of labs from Thyroid Function, CMP, and other relevant labs ; imaging/biopsy records (current and previous including abstractions from other facilities); face-to-face time discussing  her lab results and symptoms, medications doses, her options of short and long term treatment based on the latest standards of care / guidelines;   and documenting the encounter.  Linda Skinner  participated in the discussions, expressed understanding, and voiced agreement with the above plans.  All questions were answered to her satisfaction. she is encouraged to contact clinic should she have any questions or concerns prior to her return visit.    Follow up plan: Return  in about 6 months (around 02/04/2024) for F/U with Pre-visit Labs, A1c -NV.   Thank you for involving me in the care of this pleasant patient, and I will continue to update you with her progress.  Marquis Lunch, MD [facility] Phone: [phone]  Fax: [phone]   08/04/2023, 2:58 PM  This note was partially dictated with voice recognition software. Similar sounding words can be transcribed inadequately or may not  be corrected upon review.

## 2023-08-18 ENCOUNTER — Encounter: Payer: Self-pay | Admitting: Internal Medicine

## 2023-08-18 ENCOUNTER — Ambulatory Visit: Payer: Medicaid Other | Admitting: Internal Medicine

## 2023-08-18 DIAGNOSIS — F331 Major depressive disorder, recurrent, moderate: Secondary | ICD-10-CM | POA: Diagnosis not present

## 2023-08-18 DIAGNOSIS — R7303 Prediabetes: Secondary | ICD-10-CM

## 2023-08-18 DIAGNOSIS — G43109 Migraine with aura, not intractable, without status migrainosus: Secondary | ICD-10-CM

## 2023-08-18 DIAGNOSIS — R0683 Snoring: Secondary | ICD-10-CM

## 2023-08-18 DIAGNOSIS — E890 Postprocedural hypothyroidism: Secondary | ICD-10-CM

## 2023-08-18 MED ORDER — SUMATRIPTAN SUCCINATE 100 MG PO TABS: 100.0000 mg | ORAL_TABLET | ORAL | 2 refills | Status: AC | PRN

## 2023-08-18 NOTE — Progress Notes (Signed)
 Established Patient Office Visit  Subjective:  Patient ID: Linda Skinner, female    DOB: 1990/07/10  Age: 33 y.o. MRN: [id number]  CC:  Chief Complaint  Patient presents with   Care Management    4 month f/u , reports sx of loud snoring.     HPI Linda Skinner is a 33 y.o. female with past medical history of Grave's disease and major depressive disorder who presents for f/u of her chronic medical conditions.  Migraine: She reports unilateral headache, lasting for few hours, recurrent, but have been less frequent now, about 4-5 episodes in a month, and associated with photophobia and nausea.  She has tried Tylenol, ibuprofen and other OTC migraine medicines without much relief. She responded well to Sumatriptan, but did not get Topiramate. She has felt better after going in a dark room or spell of vomiting.  She also reports blurry vision and floaters at times.  She has family history of migraine as well.  Hypothyroidism: She had radioactive iodine therapy for Graves' disease in 04/2020.  She is on Levothyroxine 50 mcg QD.   Depression: She has been taking Wellbutrin 100 mg twice daily for depression and has noticed improvement of her symptoms recently. She had been stressed due to her grandmother's cancer diagnosis in the last visit. She was nervous around her mother's death anniversary.  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 Denies any SI or HI currently.  Morbid obesity: She has been able to lose 9 lbs weight since 11/24.  She admits that she has improved her diet and has been trying intermittent fasting. She agrees to strictly follow low-carb diet for now.  She has been placed on metformin for prediabetes by endocrinology.    Past Medical History:  Diagnosis Date   BV (bacterial vaginosis) 11/12/2021   11/12/21 rx flagyl   Encounter for tubal ligation 09/16/2017   Gestational diabetes    metformin   Graves disease     Maternal varicella, non-immune 02/18/2017   Immunize p delivery   Medical history non-contributory    Supervision of high risk pregnancy, antepartum 02/17/2017    [facility]iated Care at  [facility] FOB [facility] [place] Dating By LMP c/w 1st trimester U/S 7wk Pap 05/23/15 neg [facility] (got records) GC/CT Initial:   -/-             36+wks:-/- Genetic Screen NT/IT: neg CF screen declined Anatomic [place] Normal female Flu vaccine 02/17/17  Tdap Recommended ~ 28wks Glucose Screen  2 hr   99/122/114  GDM GBS pos Feed Preference both Contraception BTL, consent 02/06/1   SVD (spontaneous vaginal delivery) 09/15/2017    Past Surgical History:  Procedure Laterality Date   MOUTH SURGERY     TUBAL LIGATION N/A 09/15/2017   Procedure: POST PARTUM TUBAL LIGATION;  Surgeon: Kathrynn Running, MD;  Location: [facility] BIRTHING SUIT[facility];  Service: Gynecology;  Laterality: N/A;   WISDOM TOOTH EXTRACTION      Family History  Problem Relation Age of Onset   Cancer Paternal Grandfather    Cancer Maternal Grandmother        ovarian   Breast cancer Maternal Grandmother    Atrial fibrillation Maternal Grandmother    Cancer Father    Lupus Mother     Social History   Socioeconomic History   Marital status: Single    Spouse name: Not on file   Number of children: 4  Years of education: Not on file   Highest education level: 12th grade  Occupational History   Not on file  Tobacco Use   Smoking status: Former    Current packs/day: 0.01    Types: Cigars, Cigarettes   Smokeless tobacco: Never  Vaping Use   Vaping status: Never Used  Substance and Sexual Activity   Alcohol use: No   Drug use: No   Sexual activity: Not Currently    Birth control/protection: Surgical    Comment: tubal  Other Topics Concern   Not on file  Social History Narrative   Not on file   Social Drivers of Health   Financial Resource Strain: Low Risk  (04/16/2023)   Overall Financial Resource Strain (CARDIA)    Difficulty of Paying  Living Expenses: Not hard at all  Food Insecurity: No Food Insecurity (04/16/2023)   Hunger Vital Sign    Worried About Running Out of Food in the Last Year: Never true    Ran Out of Food in the Last Year: Never true  Transportation Needs: No Transportation Needs (04/16/2023)   PRAPARE - Administrator, Civil Service (Medical): No    Lack of Transportation (Non-Medical): No  Physical Activity: Sufficiently Active (04/16/2023)   Exercise Vital Sign    Days of Exercise per Week: 5 days    Minutes of Exercise per Session: 150+ min  Recent Concern: Physical Activity - Insufficiently Active (03/05/2023)   Exercise Vital Sign    Days of Exercise per Week: 5 days    Minutes of Exercise per Session: 10 min  Stress: Stress Concern Present (04/16/2023)   [name]    Feeling of Stress : To some extent  Social Connections: Socially Isolated (04/16/2023)   Social Connection and Isolation Panel [NHANES]    Frequency of Communication with Friends and Family: More than three times a week    Frequency of Social Gatherings with Friends and Family: Never    Attends Religious Services: Never    Database administrator or Organizations: No    Attends Banker Meetings: Never    Marital Status: Never married  Intimate Partner Violence: Not At Risk (03/05/2023)   Humiliation, Afraid, Rape, and Kick questionnaire    Fear of Current or Ex-Partner: No    Emotionally Abused: No    Physically Abused: No    Sexually Abused: No    Outpatient Medications Prior to Visit  Medication Sig Dispense Refill   buPROPion ER (WELLBUTRIN SR) 100 MG 12 hr tablet Take 1 tablet (100 mg total) by mouth 2 (two) times daily. 60 tablet 5   Ferrous Sulfate (IRON) 325 (65 Fe) MG TABS Take 1 tablet (325 mg total) by mouth daily.     hydrOXYzine (ATARAX) 10 MG tablet Take 1 tablet (10 mg total) by mouth 3 (three) times daily as needed. 60  tablet 3   levothyroxine (SYNTHROID) 50 MCG tablet Take 1 tablet (50 mcg total) by mouth daily before breakfast. 90 tablet 1   metFORMIN (GLUCOPHAGE-XR) 500 MG 24 hr tablet Take 1 tablet (500 mg total) by mouth daily with breakfast. 90 tablet 1   norethindrone (MICRONOR) 0.35 MG tablet Take 1 tablet (0.35 mg total) by mouth daily. 28 tablet 11   OVER THE COUNTER MEDICATION Women's Multivitamin with Iron     SUMAtriptan (IMITREX) 100 MG tablet Take 1 tablet (100 mg total) by mouth every 2 (two) hours as needed for  migraine. May repeat in 2 hours if headache persists or recurs. 10 tablet 2   topiramate (TOPAMAX) 25 MG tablet Take 1 tablet (25 mg total) by mouth 2 (two) times daily. 60 tablet 3   No facility-administered medications prior to visit.    No Known Allergies  ROS Review of Systems  Constitutional:  Negative for chills and fever.  HENT:  Negative for congestion, sinus pressure, sinus pain and sore throat.   Eyes:  Negative for pain and discharge.  Respiratory:  Negative for cough and shortness of breath.   Cardiovascular:  Negative for chest pain and palpitations.  Gastrointestinal:  Positive for nausea. Negative for abdominal pain, diarrhea and vomiting.  Endocrine: Negative for polydipsia and polyuria.  Genitourinary:  Negative for dysuria and hematuria.  Musculoskeletal:  Negative for neck pain and neck stiffness.  Skin:  Negative for rash.  Neurological:  Positive for headaches. Negative for dizziness and weakness.  Psychiatric/Behavioral:  Negative for agitation, sleep disturbance and suicidal ideas.       Objective:    Physical Exam Vitals reviewed.  Constitutional:      General: She is not in acute distress.    Appearance: She is obese. She is not diaphoretic.  HENT:     Head: Normocephalic and atraumatic.     Nose: Nose normal.     Mouth/Throat:     Mouth: Mucous membranes are moist.  Eyes:     General: No scleral icterus.    Extraocular Movements:  Extraocular movements intact.  Cardiovascular:     Rate and Rhythm: Normal rate and regular rhythm.     Heart sounds: Normal heart sounds. No murmur heard. Pulmonary:     Breath sounds: Normal breath sounds. No wheezing or rales.  Musculoskeletal:     Cervical back: Neck supple. No tenderness.     Right lower leg: No edema.     Left lower leg: No edema.  Skin:    General: Skin is warm.     Coloration: Skin is not pale.  Neurological:     General: No focal deficit present.     Mental Status: She is alert and oriented to person, place, and time.     Sensory: No sensory deficit.     Motor: No weakness.  Psychiatric:        Mood and Affect: Mood normal.        Behavior: Behavior normal.     BP 111/69   Pulse 90   Ht 5\' 4"  (1.626 m)   Wt 286 lb 9.6 oz (130 kg)   SpO2 97%   BMI 49.19 kg/m  Wt Readings from Last 3 Encounters:  08/18/23 286 lb 9.6 oz (130 kg)  08/04/23 286 lb 3.2 oz (129.8 kg)  06/04/23 289 lb (131.1 kg)    Lab Results  Component Value Date   TSH 5.190 (H) 07/28/2023   Lab Results  Component Value Date   WBC 5.6 05/28/2023   HGB 10.8 (L) 05/28/2023   HCT 34.1 05/28/2023   MCV 85 05/28/2023   PLT 321 05/28/2023   Lab Results  Component Value Date   NA 137 10/14/2022   K 4.1 10/14/2022   CO2 21 10/14/2022   GLUCOSE 90 10/14/2022   BUN 9 10/14/2022   CREATININE 0.76 10/14/2022   BILITOT <0.2 10/14/2022   ALKPHOS 51 10/14/2022   AST 19 10/14/2022   ALT 12 10/14/2022   PROT 7.1 10/14/2022   ALBUMIN 4.3 10/14/2022   CALCIUM 9.3 10/14/2022  ANIONGAP 9 05/05/2018   EGFR 107 10/14/2022   Lab Results  Component Value Date   CHOL 171 07/28/2023   Lab Results  Component Value Date   HDL 47 07/28/2023   Lab Results  Component Value Date   LDLCALC 112 (H) 07/28/2023   Lab Results  Component Value Date   TRIG 63 07/28/2023   Lab Results  Component Value Date   CHOLHDL 3.6 07/28/2023   Lab Results  Component Value Date   HGBA1C 5.8  08/04/2023      Assessment & Plan:   Problem List Items Addressed This Visit       Cardiovascular and Mediastinum   Migraine with aura and without status migrainosus, not intractable   Longstanding history, has tried OTC Tylenol, ibuprofen and other migraine medicines Has sumatriptan 100 mg as needed for breakthrough headache Considering her improvement in frequency of migraine, DC topiramate - she anyways did not get it from pharmacy      Relevant Medications   SUMAtriptan (IMITREX) 100 MG tablet     Endocrine   Hypothyroidism   Lab Results  Component Value Date   TSH 5.190 (H) 07/28/2023   S/p radioactive ablation On Levothyroxine 50 mcg once daily now Followed by Endocrinology        Other   Depression, major, recurrent, moderate (HCC)      08/18/2023    2:27 PM 06/04/2023   11:24 AM 04/19/2023    3:51 PM  PHQ9 SCORE ONLY  PHQ-9 Total Score 0 13 13   Well-controlled with Wellbutrin 100 mg BID Needs to take it BID, symptoms had improved with BID dosing in the past Has seasonal variations      Morbid obesity (HCC) - Primary   BMI Readings from Last 3 Encounters:  08/18/23 49.19 kg/m  08/04/23 49.13 kg/m  06/04/23 49.61 kg/m   Emphasized on diet modification and moderate exercise Had lengthy discussion about healthy diet She would be a good candidate for GLP-1 agonist therapy, she recently started Metformin - discussed about Wegovy, but she prefers to wait for now      Prediabetes   Lab Results  Component Value Date   HGBA1C 5.8 08/04/2023   Advised to follow low carb diet On Metformin 500 mg QD        Meds ordered this encounter  Medications   SUMAtriptan (IMITREX) 100 MG tablet    Sig: Take 1 tablet (100 mg total) by mouth every 2 (two) hours as needed for migraine. May repeat in 2 hours if headache persists or recurs.    Dispense:  10 tablet    Refill:  2    Follow-up: Return in about 4 months (around 12/18/2023) for Weight management.     Anabel Halon, MD

## 2023-08-18 NOTE — Assessment & Plan Note (Addendum)
 Longstanding history, has tried OTC Tylenol, ibuprofen and other migraine medicines Has sumatriptan 100 mg as needed for breakthrough headache Considering her improvement in frequency of migraine, DC topiramate - she anyways did not get it from pharmacy

## 2023-08-18 NOTE — Assessment & Plan Note (Signed)
    08/18/2023    2:27 PM 06/04/2023   11:24 AM 04/19/2023    3:51 PM  PHQ9 SCORE ONLY  PHQ-9 Total Score 0 13 13   Well-controlled with Wellbutrin 100 mg BID Needs to take it BID, symptoms had improved with BID dosing in the past Has seasonal variations

## 2023-08-18 NOTE — Assessment & Plan Note (Addendum)
 BMI Readings from Last 3 Encounters:  08/18/23 49.19 kg/m  08/04/23 49.13 kg/m  06/04/23 49.61 kg/m   Emphasized on diet modification and moderate exercise Had lengthy discussion about healthy diet She would be a good candidate for GLP-1 agonist therapy, she recently started Metformin - discussed about Wegovy, but she prefers to wait for now

## 2023-08-18 NOTE — Assessment & Plan Note (Signed)
 Lab Results  Component Value Date   TSH 5.190 (H) 07/28/2023   S/p radioactive ablation On Levothyroxine 50 mcg once daily now Followed by Endocrinology

## 2023-08-18 NOTE — Assessment & Plan Note (Signed)
 Lab Results  Component Value Date   HGBA1C 5.8 08/04/2023   Advised to follow low carb diet On Metformin 500 mg QD

## 2023-08-18 NOTE — Patient Instructions (Signed)
 Please continue to take medications as prescribed.  Please continue to follow low carb diet and perform moderate exercise/walking at least 150 mins/week.

## 2023-08-19 DIAGNOSIS — R0683 Snoring: Secondary | ICD-10-CM | POA: Insufficient documentation

## 2023-08-19 NOTE — Assessment & Plan Note (Signed)
 Has snoring, but denies hypersomnolence, daytime fatigue currently BP wnl now Fatigue has improved with weight loss recently STOP-BANG: 2 Advised to continue efforts to lose weight If persistent concern, will discuss home sleep study

## 2023-08-23 ENCOUNTER — Other Ambulatory Visit: Payer: Self-pay

## 2023-08-23 ENCOUNTER — Other Ambulatory Visit: Payer: Self-pay | Admitting: "Endocrinology

## 2023-08-23 DIAGNOSIS — F331 Major depressive disorder, recurrent, moderate: Secondary | ICD-10-CM

## 2023-08-23 MED ORDER — BUPROPION HCL ER (SR) 100 MG PO TB12
100.0000 mg | ORAL_TABLET | Freq: Two times a day (BID) | ORAL | 0 refills | Status: AC
Start: 2023-08-23 — End: 2024-03-22

## 2023-08-24 ENCOUNTER — Ambulatory Visit: Payer: Self-pay

## 2023-08-26 ENCOUNTER — Ambulatory Visit

## 2023-08-27 ENCOUNTER — Ambulatory Visit

## 2023-09-24 ENCOUNTER — Other Ambulatory Visit: Payer: Self-pay | Admitting: Adult Health

## 2023-09-24 DIAGNOSIS — D500 Iron deficiency anemia secondary to blood loss (chronic): Secondary | ICD-10-CM

## 2023-09-24 NOTE — Progress Notes (Signed)
 Ck CBC and iron  panel

## 2023-09-25 LAB — CBC
Hematocrit: 34.9 % (ref 34.0–46.6)
Hemoglobin: 11.3 g/dL (ref 11.1–15.9)
MCH: 27.5 pg (ref 26.6–33.0)
MCHC: 32.4 g/dL (ref 31.5–35.7)
MCV: 85 fL (ref 79–97)
Platelets: 291 10*3/uL (ref 150–450)
RBC: 4.11 x10E6/uL (ref 3.77–5.28)
RDW: 13.7 % (ref 11.7–15.4)
WBC: 4.5 10*3/uL (ref 3.4–10.8)

## 2023-09-25 LAB — IRON,TIBC AND FERRITIN PANEL
Ferritin: 17 ng/mL (ref 15–150)
Iron Saturation: 33 % (ref 15–55)
Iron: 112 ug/dL (ref 27–159)
Total Iron Binding Capacity: 337 ug/dL (ref 250–450)
UIBC: 225 ug/dL (ref 131–425)

## 2023-10-01 ENCOUNTER — Encounter: Payer: Self-pay | Admitting: Adult Health

## 2023-10-01 ENCOUNTER — Ambulatory Visit: Payer: Medicaid Other | Admitting: Adult Health

## 2023-10-01 ENCOUNTER — Other Ambulatory Visit (HOSPITAL_COMMUNITY)
Admission: RE | Admit: 2023-10-01 | Discharge: 2023-10-01 | Disposition: A | Discharge status: Home / Self Care | Source: Ambulatory Visit | Attending: Adult Health | Admitting: Adult Health

## 2023-10-01 VITALS — BP 115/78 | HR 82 | Ht 64.0 in | Wt 294.0 lb

## 2023-10-01 DIAGNOSIS — N898 Other specified noninflammatory disorders of vagina: Secondary | ICD-10-CM | POA: Diagnosis present

## 2023-10-01 DIAGNOSIS — N921 Excessive and frequent menstruation with irregular cycle: Secondary | ICD-10-CM | POA: Diagnosis not present

## 2023-10-01 DIAGNOSIS — Z113 Encounter for screening for infections with a predominantly sexual mode of transmission: Secondary | ICD-10-CM

## 2023-10-01 DIAGNOSIS — F419 Anxiety disorder, unspecified: Secondary | ICD-10-CM

## 2023-10-01 DIAGNOSIS — F32A Depression, unspecified: Secondary | ICD-10-CM

## 2023-10-01 NOTE — Progress Notes (Signed)
 Subjective:     Patient ID: Linda Skinner, female   DOB: February 17, 1991, 33 y.o.   MRN: [id number]  HPI Linda Skinner is a 33 year old black female,single, [phone] in for follow up on bleeding started on micronor  and is much better and anxiety and depression, and is better, except when behind the wheel, she says and wants STD testomg has noticed odor at times.     Component Value Date/Time   DIAGPAP  03/05/2023 1140    - Negative for intraepithelial lesion or malignancy (NILM)   DIAGPAP  05/01/2020 1204    - Negative for intraepithelial lesion or malignancy (NILM)   HPVHIGH Negative 03/05/2023 1140   HPVHIGH Negative 05/01/2020 1204   ADEQPAP  03/05/2023 1140    Satisfactory for evaluation; transformation zone component ABSENT.   ADEQPAP  05/01/2020 1204    Satisfactory for evaluation; transformation zone component PRESENT.    PCP is Dr Linda Skinner   Review of Systems Bleeding better on Micronor  Anxiety and depression is better, except when behind the wheel Has noticed vaginal odor at times Reviewed past medical,surgical, social and family history. Reviewed medications and allergies.     Objective:   Physical Exam BP 115/78 (BP Location: Right Arm, Patient Position: Sitting, Cuff Size: Large)   Pulse 82   Ht 5\' 4"  (1.626 m)   Wt 294 lb (133.4 kg)   LMP 09/01/2023 (Exact Date)   BMI 50.46 kg/m   Skin warm and dry.Pelvic: external genitalia is normal in appearance no lesions, vagina: tan discharge without odor,urethra has no lesions or masses noted, cervix:smooth and bulbous, uterus: normal size, shape and contour, non tender, no masses felt, adnexa: no masses or tenderness noted. Bladder is non tender and no masses felt. CV swab obtained.       10/01/2023   10:16 AM 08/18/2023    2:27 PM 06/04/2023   11:24 AM  Depression screen PHQ 2/9  Decreased Interest 1 0 1  Down, Depressed, Hopeless 1 0 2  PHQ - 2 Score 2 0 3  Altered sleeping 2 0 3  Tired, decreased energy 3 0 1  Change in appetite 1  0 3  Feeling bad or failure about yourself  1 0 1  Trouble concentrating 0 0 2  Moving slowly or fidgety/restless 0 0 0  Suicidal thoughts 0 0 0  PHQ-9 Score 9 0 13  Difficult doing work/chores  Not difficult at all        10/01/2023   10:17 AM 08/18/2023    2:28 PM 06/04/2023   11:25 AM 04/19/2023    3:51 PM  GAD 7 : Generalized Anxiety Score  Nervous, Anxious, on Edge 3 0 1 2  Control/stop worrying 2 0 3 3  Worry too much - different things 2 0 2 2  Trouble relaxing 1 0 1 2  Restless 1 0 1 0  Easily annoyed or irritable 3 0 3 3  Afraid - awful might happen 1 0 1 0  Total GAD 7 Score 13 0 12 12  Anxiety Difficulty  Not difficult at all      Examination chaperoned by Alphonso Aschoff LPN  Assessment:     1. Vaginal odor (Primary) No odor today CV swab sent  - Cervicovaginal ancillary only( [place])  2. Screening for STD (sexually transmitted disease) CV swab sent for GC/CHL,trich, BV and yeast  - Cervicovaginal ancillary only( [place])  3. Menorrhagia with irregular cycle Bleeding better on Micronor , has refills   4.  Anxiety and depression On Wellbutrin  from PCP has atarax  for anxiety    Follow up with PCP Plan:     Follow up prn

## 2023-10-02 ENCOUNTER — Other Ambulatory Visit: Payer: Self-pay | Admitting: "Endocrinology

## 2023-10-02 DIAGNOSIS — E0500 Thyrotoxicosis with diffuse goiter without thyrotoxic crisis or storm: Secondary | ICD-10-CM

## 2023-10-04 ENCOUNTER — Other Ambulatory Visit: Payer: Self-pay | Admitting: Adult Health

## 2023-10-04 ENCOUNTER — Encounter: Payer: Self-pay | Admitting: Adult Health

## 2023-10-04 DIAGNOSIS — A599 Trichomoniasis, unspecified: Secondary | ICD-10-CM | POA: Insufficient documentation

## 2023-10-04 LAB — CERVICOVAGINAL ANCILLARY ONLY
Bacterial Vaginitis (gardnerella): POSITIVE — AB
Candida Glabrata: NEGATIVE
Candida Vaginitis: POSITIVE — AB
Chlamydia: NEGATIVE
Comment: NEGATIVE
Comment: NEGATIVE
Comment: NEGATIVE
Comment: NEGATIVE
Comment: NEGATIVE
Comment: NORMAL
Neisseria Gonorrhea: NEGATIVE
Trichomonas: POSITIVE — AB

## 2023-10-04 MED ORDER — FLUCONAZOLE 150 MG PO TABS: ORAL_TABLET | ORAL | 1 refills | Status: DC

## 2023-10-04 MED ORDER — METRONIDAZOLE 500 MG PO TABS: 500.0000 mg | ORAL_TABLET | Freq: Two times a day (BID) | ORAL | 0 refills | Status: DC

## 2023-10-04 NOTE — Progress Notes (Signed)
+  trich,BV and yeast on vaginal swab, will rx flagyl  and diflucan , no sex or alcohol while taking and get self swab in 10 days to check for proof of cure for + trich or trichomonas which is a STD and your partner needs to be treated too. He can see his MD or go to health dept or I can treat will need name,dob,any allergies and drug store.

## 2023-10-30 ENCOUNTER — Ambulatory Visit

## 2023-11-12 ENCOUNTER — Encounter: Payer: Self-pay | Admitting: Internal Medicine

## 2023-12-19 ENCOUNTER — Ambulatory Visit
Admission: RE | Admit: 2023-12-19 | Discharge: 2023-12-19 | Disposition: A | Discharge status: Home / Self Care | Source: Ambulatory Visit | Attending: Nurse Practitioner | Admitting: Nurse Practitioner

## 2023-12-19 VITALS — BP 142/84 | HR 82 | Temp 98.3°F | Resp 18

## 2023-12-19 DIAGNOSIS — Z113 Encounter for screening for infections with a predominantly sexual mode of transmission: Secondary | ICD-10-CM | POA: Insufficient documentation

## 2023-12-19 NOTE — Discharge Instructions (Signed)
 Your results should be available within the next 48 to 72 hours.  If you have access to MyChart, you will be able to see the results there.  If your results are positive, you will be contacted to discuss treatment. Increase condom use with each sexual encounter. Recommend refraining from sexual intercourse until your test results are received.  If you do test positive, you will need to notify all partners.  Also, if treatment is required, you will need to refrain from sexual intercourse for an additional 7 days after completing treatment. Follow-up as needed.

## 2023-12-19 NOTE — ED Triage Notes (Signed)
 Wants an STD test done to confirm everything is negative now.

## 2023-12-19 NOTE — ED Provider Notes (Signed)
 [facility]    CSN: [id number] Arrival date & time: 12/19/23  1145      History   Chief Complaint Chief Complaint  Patient presents with   Follow-up    STD testing - Entered by patient    HPI Linda Skinner is a 33 y.o. female.   The history is provided by the patient.   Patient presents for STI testing.  Patient states she was treated for trichomonas in May and was advised by her gynecologist to follow-up for repeat testing to ensure the infection had cleared.  Patient states she has not had time to do so, and is presenting today for the follow-up testing.  She denies vaginal odor, vaginal discharge, vaginal itching, urinary symptoms, or abdominal pain.  Per review of the chart, the patient's partner was also treated by her gynecologist.  Past Medical History:  Diagnosis Date   BV (bacterial vaginosis) 11/12/2021   11/12/21 rx flagyl    Encounter for tubal ligation 09/16/2017   Gestational diabetes    metformin    Graves disease    Maternal varicella, non-immune 02/18/2017   Immunize p delivery   Medical history non-contributory    Supervision of high risk pregnancy, antepartum 02/17/2017    [facility] Care at  [facility] FOB Ozell Poteat Dating By LMP c/w 1st trimester U/S 7wk Pap 05/23/15 neg [facility] (got records) GC/CT Initial:   -/-             36+wks:-/- Genetic Screen NT/IT: neg CF screen declined Anatomic US  Normal female Flu vaccine 02/17/17  Tdap Recommended ~ 28wks Glucose Screen  2 hr   99/122/114  GDM GBS pos Feed Preference both Contraception BTL, consent 02/06/1   SVD (spontaneous vaginal delivery) 09/15/2017    Patient Active Problem List   Diagnosis Date Noted   Trichomoniasis 10/04/2023   Screening for STD (sexually transmitted disease) 10/01/2023   Vaginal odor 10/01/2023   Snoring 08/19/2023   Iron  deficiency anemia due to chronic blood loss 06/04/2023   Anxiety and depression 03/05/2023   Menorrhagia with irregular cycle 03/05/2023    Migraine with aura and without status migrainosus, not intractable 03/05/2023   Right knee pain 06/04/2022   Vitamin D  deficiency 04/02/2022   Hypothyroidism following radioiodine therapy 09/29/2021   Mixed hyperlipidemia 09/29/2021   Hypothyroidism 04/15/2021   Encounter for general adult medical examination with abnormal findings 04/15/2021   Prediabetes 04/15/2021   Tinea versicolor 01/13/2021   Graves disease 04/12/2020   Depression, major, recurrent, moderate (HCC) 03/08/2020   Morbid obesity (HCC) 03/08/2020   Gestational diabetes mellitus, class A2 06/17/2017   Ovarian cyst 05/26/2017    Past Surgical History:  Procedure Laterality Date   MOUTH SURGERY     TUBAL LIGATION N/A 09/15/2017   Procedure: POST PARTUM TUBAL LIGATION;  Surgeon: Kandis Devaughn Sayres, MD;  Location: [facility];  Service: Gynecology;  Laterality: N/A;   WISDOM TOOTH EXTRACTION      OB History     Gravida  6   Para  4   Term  4   Preterm      AB  2   Living  4      SAB      IAB      Ectopic      Multiple  0   Live Births  4            Home Medications    Prior to Admission medications   Medication Sig Start  Date End Date Taking? Authorizing Provider  buPROPion  ER (WELLBUTRIN  SR) 100 MG 12 hr tablet Take 1 tablet (100 mg total) by mouth 2 (two) times daily. Patient taking differently: Take 100 mg by mouth daily. 08/23/23 11/21/23  [name], MD  Ferrous Sulfate  (IRON ) 325 (65 Fe) MG TABS Take 1 tablet (325 mg total) by mouth daily. 06/04/23   Signa Delon LABOR, NP  hydrOXYzine  (ATARAX ) 10 MG tablet Take 1 tablet (10 mg total) by mouth 3 (three) times daily as needed. 06/04/23   Signa Delon LABOR, NP  levothyroxine  (SYNTHROID ) 50 MCG tablet Take 1 tablet (50 mcg total) by mouth daily before breakfast. 08/04/23   Nida, Gebreselassie W, MD  metFORMIN  (GLUCOPHAGE -XR) 500 MG 24 hr tablet TAKE ONE TABLET BY MOUTH EVERY DAY WITH BREAKFAST 08/24/23   Nida, Gebreselassie W, MD   norethindrone  (MICRONOR ) 0.35 MG tablet Take 1 tablet (0.35 mg total) by mouth daily. 03/05/23   Signa Delon LABOR, NP  OVER THE COUNTER MEDICATION Women's Multivitamin with Iron     [provider]  SUMAtriptan  (IMITREX ) 100 MG tablet Take 1 tablet (100 mg total) by mouth every 2 (two) hours as needed for migraine. May repeat in 2 hours if headache persists or recurs. 08/18/23   [name], MD    Family History Family History  Problem Relation Age of Onset   Cancer Paternal Grandfather    Cancer Maternal Grandmother        ovarian   Breast cancer Maternal Grandmother    Atrial fibrillation Maternal Grandmother    Cancer Father    Lupus Mother     Social History Social History   Tobacco Use   Smoking status: Former    Current packs/day: 0.01    Types: Cigars, Cigarettes   Smokeless tobacco: Never  Vaping Use   Vaping status: Never Used  Substance Use Topics   Alcohol use: No   Drug use: No     Allergies   Patient has no known allergies.   Review of Systems Review of Systems Per HPI  Physical Exam Triage Vital Signs ED Triage Vitals  Encounter Vitals Group     BP 12/19/23 1151 (!) 142/84     Girls Systolic BP Percentile --      Girls Diastolic BP Percentile --      Boys Systolic BP Percentile --      Boys Diastolic BP Percentile --      Pulse Rate 12/19/23 1151 82     Resp 12/19/23 1151 18     Temp 12/19/23 1151 98.3 F (36.8 C)     Temp Source 12/19/23 1151 Oral     SpO2 12/19/23 1151 96 %     Weight --      Height --      Head Circumference --      Peak Flow --      Pain Score 12/19/23 1152 0     Pain Loc --      Pain Education --      Exclude from Growth Chart --    No data found.  Updated Vital Signs BP (!) 142/84 (BP Location: Right Arm)   Pulse 82   Temp 98.3 F (36.8 C) (Oral)   Resp 18   LMP 12/03/2023 (Exact Date)   SpO2 96%   Visual Acuity Right Eye Distance:   Left Eye Distance:   Bilateral Distance:    Right  Eye Near:   Left Eye Near:  Bilateral Near:     Physical Exam Vitals and nursing note reviewed.  Constitutional:      General: She is not in acute distress.    Appearance: Normal appearance.  HENT:     Head: Normocephalic.  Eyes:     Extraocular Movements: Extraocular movements intact.     Pupils: Pupils are equal, round, and reactive to light.  Cardiovascular:     Rate and Rhythm: Normal rate and regular rhythm.     Pulses: Normal pulses.     Heart sounds: Normal heart sounds.  Pulmonary:     Effort: Pulmonary effort is normal. No respiratory distress.     Breath sounds: Normal breath sounds. No stridor. No wheezing, rhonchi or rales.  Abdominal:     General: Bowel sounds are normal.     Palpations: Abdomen is soft.  Genitourinary:    Comments: GU exam deferred, self swab performed  Musculoskeletal:     Cervical back: Normal range of motion.  Neurological:     General: No focal deficit present.     Mental Status: She is alert and oriented to person, place, and time.  Psychiatric:        Mood and Affect: Mood normal.        Behavior: Behavior normal.      UC Treatments / Results  Labs (all labs ordered are listed, but only abnormal results are displayed) Labs Reviewed  CERVICOVAGINAL ANCILLARY ONLY    EKG   Radiology No results found.  Procedures Procedures (including critical care time)  Medications Ordered in UC Medications - No data to display  Initial Impression / Assessment and Plan / UC Course  I have reviewed the triage vital signs and the nursing notes.  Pertinent labs & imaging results that were available during my care of the patient were reviewed by me and considered in my medical decision making (see chart for details).  Cytology swab is pending.  Patient is requesting full STI panel at this time.  Per review of the chart, patient tested positive for trichomonas, BV, and yeast in May and received treatment from her gynecologist.   Gynecologist also treated the patient's partner for trichomonas.  Supportive care recommendations were provided and discussed with the patient to include refrain from sexual intercourse until test results were received, and increasing condom use.    Patient was in agreement with this plan of care and verbalizes understanding.  All questions were answered.  Patient stable for discharge.   Final Clinical Impressions(s) / UC Diagnoses   Final diagnoses:  Screening for STD (sexually transmitted disease)     Discharge Instructions      Your results should be available within the next 48 to 72 hours.  If you have access to MyChart, you will be able to see the results there.  If your results are positive, you will be contacted to discuss treatment. Increase condom use with each sexual encounter. Recommend refraining from sexual intercourse until your test results are received.  If you do test positive, you will need to notify all partners.  Also, if treatment is required, you will need to refrain from sexual intercourse for an additional 7 days after completing treatment. Follow-up as needed.      ED Prescriptions   None    PDMP not reviewed this encounter.   Gilmer Etta PARAS, NP 12/19/23 1205

## 2023-12-20 LAB — CERVICOVAGINAL ANCILLARY ONLY
Bacterial Vaginitis (gardnerella): NEGATIVE
Candida Glabrata: NEGATIVE
Candida Vaginitis: NEGATIVE
Chlamydia: NEGATIVE
Comment: NEGATIVE
Comment: NEGATIVE
Comment: NEGATIVE
Comment: NEGATIVE
Comment: NEGATIVE
Comment: NORMAL
Neisseria Gonorrhea: NEGATIVE
Trichomonas: NEGATIVE

## 2023-12-21 ENCOUNTER — Ambulatory Visit

## 2023-12-22 ENCOUNTER — Encounter: Payer: Self-pay | Admitting: Internal Medicine

## 2023-12-22 ENCOUNTER — Ambulatory Visit: Admitting: Internal Medicine

## 2023-12-22 ENCOUNTER — Other Ambulatory Visit (HOSPITAL_COMMUNITY): Payer: Self-pay

## 2023-12-22 DIAGNOSIS — G5601 Carpal tunnel syndrome, right upper limb: Secondary | ICD-10-CM | POA: Insufficient documentation

## 2023-12-22 DIAGNOSIS — R7303 Prediabetes: Secondary | ICD-10-CM

## 2023-12-22 DIAGNOSIS — E890 Postprocedural hypothyroidism: Secondary | ICD-10-CM

## 2023-12-22 DIAGNOSIS — G43109 Migraine with aura, not intractable, without status migrainosus: Secondary | ICD-10-CM

## 2023-12-22 DIAGNOSIS — E559 Vitamin D deficiency, unspecified: Secondary | ICD-10-CM | POA: Diagnosis not present

## 2023-12-22 MED ORDER — WEGOVY 0.25 MG/0.5ML ~~LOC~~ SOAJ
0.2500 mg | SUBCUTANEOUS | 0 refills | Status: DC
Start: 2023-12-22 — End: 2024-01-24

## 2023-12-22 NOTE — Patient Instructions (Addendum)
 Please start taking Wegovy  as prescribed.  Please use wrist brace at nighttime. Please start taking Pyridoxine  100 mg once daily for 12 weeks.  Please continue to take other medications as prescribed.  Please continue to follow low carb diet and perform moderate exercise/walking at least 150 mins/week.  Please get fasting blood tests done before the next visit.

## 2023-12-22 NOTE — Assessment & Plan Note (Signed)
 Lab Results  Component Value Date   HGBA1C 5.8 08/04/2023   Advised to follow low carb diet On Metformin 500 mg QD

## 2023-12-22 NOTE — Assessment & Plan Note (Signed)
 Although her symptoms are not completely suggestive of carpal tunnel syndrome, pattern of her wrist pain is likely due to carpal tunnel syndrome Advised to use wrist brace If persistent, will refer to Hand surgery

## 2023-12-22 NOTE — Assessment & Plan Note (Signed)
 BMI Readings from Last 3 Encounters:  12/22/23 51.01 kg/m  10/01/23 50.46 kg/m  08/18/23 49.19 kg/m   Advised to continue to follow low-carb diet and perform moderate exercise/walking at least 150 minutes/week Had lengthy discussion about healthy diet She has tried low-carb diet and intermittent fasting for more than 6 months now She does not have history of pancreatitis or family history of thyroid  cancer She would be a good candidate for GLP-1 agonist therapy, started Wegovy  - initial BMI: 51.01, plan to increase dose as tolerated

## 2023-12-22 NOTE — Assessment & Plan Note (Signed)
 Lab Results  Component Value Date   TSH 5.190 (H) 07/28/2023   S/p radioactive ablation On Levothyroxine 50 mcg once daily now Followed by Endocrinology

## 2023-12-22 NOTE — Assessment & Plan Note (Signed)
 Longstanding history, has tried OTC Tylenol , ibuprofen  and other migraine medicines Has sumatriptan  100 mg as needed for breakthrough headache

## 2023-12-22 NOTE — Progress Notes (Signed)
 Established Patient Office Visit  Subjective:  Patient ID: Linda Skinner, female    DOB: 1991/03/14  Age: 33 y.o. MRN: [id number]  CC:  Chief Complaint  Patient presents with   Medical Management of Chronic Issues    4 month f/u would like to discuss weight loss, has been eating healthier, and physical activity.    Wrist Pain    Pt reports right wrist pain.     HPI Linda Skinner is a 33 y.o. female with past medical history of Grave's disease and major depressive disorder who presents for f/u of her chronic medical conditions.  Migraine: She has unilateral headache, lasting for few hours, recurrent, but have been less frequent now, about 4-5 episodes in a month, and associated with photophobia and nausea.  She has tried Tylenol , ibuprofen  and other OTC migraine medicines without much relief. She responded well to Sumatriptan , but did not get Topiramate . She has felt better after going in a dark room or spell of vomiting.  She also reports blurry vision and floaters at times.  She has family history of migraine as well.  Hypothyroidism: She had radioactive iodine therapy for Graves' disease in 04/2020.  She is on Levothyroxine  50 mcg QD.  Depression: She has been taking Wellbutrin  100 mg once daily for depression and has noticed improvement of her symptoms recently. She had been stressed due to her grandmother's cancer diagnosis in the last visit. She was nervous around her mother's death anniversary.  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 Denies any SI or HI currently.  Morbid obesity: She was able to lose 9 lbs weight since 11/24 till the last visit.  She admits that she has improved her diet and has been trying intermittent fasting. She agrees to strictly follow low-carb diet for now.  She has been placed on metformin  for prediabetes by endocrinology, but has not noticed much weight loss with it. After discussion, she prefers  to start Wegovy  now.  She reports right wrist pain for the last 2 weeks, which is intermittent, worse with movement, especially extension.  Denies any recent injury.  She has to type in moderate amount on daily basis at workplace.  Denies any numbness or tingling of the hand currently.    Past Medical History:  Diagnosis Date   BV (bacterial vaginosis) 11/12/2021   11/12/21 rx flagyl    Encounter for tubal ligation 09/16/2017   Gestational diabetes    metformin    Graves disease    Maternal varicella, non-immune 02/18/2017   Immunize p delivery   Medical history non-contributory    Supervision of high risk pregnancy, antepartum 02/17/2017    [facility] at  [facility] FOB Ozell Poteat Dating By LMP c/w 1st trimester U/S 7wk Pap 05/23/15 neg [facility] (got records) GC/CT Initial:   -/-             36+wks:-/- Genetic Screen NT/IT: neg CF screen declined Anatomic US  Normal female Flu vaccine 02/17/17  Tdap Recommended ~ 28wks Glucose Screen  2 hr   99/122/114  GDM GBS pos Feed Preference both Contraception BTL, consent 02/06/1   SVD (spontaneous vaginal delivery) 09/15/2017    Past Surgical History:  Procedure Laterality Date   MOUTH SURGERY     TUBAL LIGATION N/A 09/15/2017   Procedure: POST PARTUM TUBAL LIGATION;  Surgeon: Kandis Devaughn Sayres, MD;  Location: [facility];  Service: Gynecology;  Laterality: N/A;  WISDOM TOOTH EXTRACTION      Family History  Problem Relation Age of Onset   Cancer Paternal Grandfather    Cancer Maternal Grandmother        ovarian   Breast cancer Maternal Grandmother    Atrial fibrillation Maternal Grandmother    Cancer Father    Lupus Mother     Social History   Socioeconomic History   Marital status: Single    Spouse name: Not on file   Number of children: 4   Years of education: Not on file   Highest education level: 12th grade  Occupational History   Not on file  Tobacco Use   Smoking status: Former    Current packs/day:  0.01    Types: Cigars, Cigarettes   Smokeless tobacco: Never  Vaping Use   Vaping status: Never Used  Substance and Sexual Activity   Alcohol use: No   Drug use: No   Sexual activity: Yes    Birth control/protection: Surgical, Pill    Comment: tubal  Other Topics Concern   Not on file  Social History Narrative   Not on file   Social Drivers of Health   Financial Resource Strain: Low Risk  (12/18/2023)   Overall Financial Resource Strain (CARDIA)    Difficulty of Paying Living Expenses: Not hard at all  Food Insecurity: No Food Insecurity (12/18/2023)   Hunger Vital Sign    Worried About Running Out of Food in the Last Year: Never true    Ran Out of Food in the Last Year: Never true  Transportation Needs: No Transportation Needs (12/18/2023)   PRAPARE - Administrator, Civil Service (Medical): No    Lack of Transportation (Non-Medical): No  Physical Activity: Inactive (12/18/2023)   Exercise Vital Sign    Days of Exercise per Week: 5 days    Minutes of Exercise per Session: 0 min  Stress: Stress Concern Present (12/18/2023)   [name]    Feeling of Stress: Rather much  Social Connections: Socially Isolated (12/18/2023)   Social Connection and Isolation Panel    Frequency of Communication with Friends and Family: Once a week    Frequency of Social Gatherings with Friends and Family: Never    Attends Religious Services: 1 to 4 times per year    Active Member of [employer] or Organizations: No    Attends Engineer, structural: Not on file    Marital Status: Never married  Intimate Partner Violence: Not At Risk (03/05/2023)   Humiliation, Afraid, Rape, and Kick questionnaire    Fear of Current or Ex-Partner: No    Emotionally Abused: No    Physically Abused: No    Sexually Abused: No    Outpatient Medications Prior to Visit  Medication Sig Dispense Refill   buPROPion  ER (WELLBUTRIN  SR) 100 MG  12 hr tablet Take 1 tablet (100 mg total) by mouth 2 (two) times daily. (Patient taking differently: Take 100 mg by mouth daily.) 180 tablet 0   Ferrous Sulfate  (IRON ) 325 (65 Fe) MG TABS Take 1 tablet (325 mg total) by mouth daily.     hydrOXYzine  (ATARAX ) 10 MG tablet Take 1 tablet (10 mg total) by mouth 3 (three) times daily as needed. 60 tablet 3   levothyroxine  (SYNTHROID ) 50 MCG tablet Take 1 tablet (50 mcg total) by mouth daily before breakfast. 90 tablet 1   metFORMIN  (GLUCOPHAGE -XR) 500 MG 24 hr tablet TAKE ONE TABLET  BY MOUTH EVERY DAY WITH BREAKFAST 90 tablet 1   norethindrone  (MICRONOR ) 0.35 MG tablet Take 1 tablet (0.35 mg total) by mouth daily. 28 tablet 11   OVER THE COUNTER MEDICATION Women's Multivitamin with Iron      SUMAtriptan  (IMITREX ) 100 MG tablet Take 1 tablet (100 mg total) by mouth every 2 (two) hours as needed for migraine. May repeat in 2 hours if headache persists or recurs. 10 tablet 2   No facility-administered medications prior to visit.    No Known Allergies  ROS Review of Systems  Constitutional:  Negative for chills and fever.  HENT:  Negative for congestion, sinus pressure, sinus pain and sore throat.   Eyes:  Negative for pain and discharge.  Respiratory:  Negative for cough and shortness of breath.   Cardiovascular:  Negative for chest pain and palpitations.  Gastrointestinal:  Positive for nausea. Negative for abdominal pain, diarrhea and vomiting.  Endocrine: Negative for polydipsia and polyuria.  Genitourinary:  Negative for dysuria and hematuria.  Musculoskeletal:  Positive for arthralgias (Right wrist). Negative for neck pain and neck stiffness.  Skin:  Negative for rash.  Neurological:  Positive for headaches. Negative for dizziness and weakness.  Psychiatric/Behavioral:  Negative for agitation, sleep disturbance and suicidal ideas.       Objective:    Physical Exam Vitals reviewed.  Constitutional:      General: She is not in acute  distress.    Appearance: She is obese. She is not diaphoretic.  HENT:     Head: Normocephalic and atraumatic.     Nose: Nose normal.     Mouth/Throat:     Mouth: Mucous membranes are moist.  Eyes:     General: No scleral icterus.    Extraocular Movements: Extraocular movements intact.  Cardiovascular:     Rate and Rhythm: Normal rate and regular rhythm.     Heart sounds: Normal heart sounds. No murmur heard. Pulmonary:     Breath sounds: Normal breath sounds. No wheezing or rales.  Musculoskeletal:     Right wrist: Tenderness present. No swelling.     Cervical back: Neck supple. No tenderness.     Right lower leg: No edema.     Left lower leg: No edema.     Comments: Unable to perform Phalen test due to severe pain  Skin:    General: Skin is warm.     Coloration: Skin is not pale.  Neurological:     General: No focal deficit present.     Mental Status: She is alert and oriented to person, place, and time.     Sensory: No sensory deficit.     Motor: No weakness.  Psychiatric:        Mood and Affect: Mood normal.        Behavior: Behavior normal.     BP 135/77   Pulse 99   Ht 5' 4 (1.626 m)   Wt 297 lb 3.2 oz (134.8 kg)   LMP 12/03/2023 (Exact Date)   SpO2 94%   BMI 51.01 kg/m  Wt Readings from Last 3 Encounters:  12/22/23 297 lb 3.2 oz (134.8 kg)  10/01/23 294 lb (133.4 kg)  08/18/23 286 lb 9.6 oz (130 kg)    Lab Results  Component Value Date   TSH 5.190 (H) 07/28/2023   Lab Results  Component Value Date   WBC 4.5 09/24/2023   HGB 11.3 09/24/2023   HCT 34.9 09/24/2023   MCV 85 09/24/2023   PLT 291  09/24/2023   Lab Results  Component Value Date   NA 137 10/14/2022   K 4.1 10/14/2022   CO2 21 10/14/2022   GLUCOSE 90 10/14/2022   BUN 9 10/14/2022   CREATININE 0.76 10/14/2022   BILITOT <0.2 10/14/2022   ALKPHOS 51 10/14/2022   AST 19 10/14/2022   ALT 12 10/14/2022   PROT 7.1 10/14/2022   ALBUMIN 4.3 10/14/2022   CALCIUM 9.3 10/14/2022   ANIONGAP  9 05/05/2018   EGFR 107 10/14/2022   Lab Results  Component Value Date   CHOL 171 07/28/2023   Lab Results  Component Value Date   HDL 47 07/28/2023   Lab Results  Component Value Date   LDLCALC 112 (H) 07/28/2023   Lab Results  Component Value Date   TRIG 63 07/28/2023   Lab Results  Component Value Date   CHOLHDL 3.6 07/28/2023   Lab Results  Component Value Date   HGBA1C 5.8 08/04/2023      Assessment & Plan:   Problem List Items Addressed This Visit       Cardiovascular and Mediastinum   Migraine with aura and without status migrainosus, not intractable   Longstanding history, has tried OTC Tylenol , ibuprofen  and other migraine medicines Has sumatriptan  100 mg as needed for breakthrough headache        Endocrine   Hypothyroidism   Lab Results  Component Value Date   TSH 5.190 (H) 07/28/2023   S/p radioactive ablation On Levothyroxine  50 mcg once daily now Followed by Endocrinology      Relevant Orders   CMP14+EGFR   CBC with Differential/Platelet     Nervous and Auditory   Carpal tunnel syndrome of right wrist   Although her symptoms are not completely suggestive of carpal tunnel syndrome, pattern of her wrist pain is likely due to carpal tunnel syndrome Advised to use wrist brace If persistent, will refer to Hand surgery      Relevant Medications   Semaglutide -Weight Management (WEGOVY ) 0.25 MG/0.5ML SOAJ     Other   Morbid obesity (HCC) - Primary   BMI Readings from Last 3 Encounters:  12/22/23 51.01 kg/m  10/01/23 50.46 kg/m  08/18/23 49.19 kg/m   Advised to continue to follow low-carb diet and perform moderate exercise/walking at least 150 minutes/week Had lengthy discussion about healthy diet She has tried low-carb diet and intermittent fasting for more than 6 months now She does not have history of pancreatitis or family history of thyroid  cancer She would be a good candidate for GLP-1 agonist therapy, started Wegovy  - initial  BMI: 51.01, plan to increase dose as tolerated      Relevant Medications   Semaglutide -Weight Management (WEGOVY ) 0.25 MG/0.5ML SOAJ   Prediabetes   Lab Results  Component Value Date   HGBA1C 5.8 08/04/2023   Advised to follow low carb diet On Metformin  500 mg QD      Relevant Orders   Hemoglobin A1c   CMP14+EGFR   Vitamin D  deficiency   Relevant Orders   VITAMIN D  25 Hydroxy (Vit-D Deficiency, Fractures)      Meds ordered this encounter  Medications   Semaglutide -Weight Management (WEGOVY ) 0.25 MG/0.5ML SOAJ    Sig: Inject 0.25 mg into the skin every 7 (seven) days.    Dispense:  2 mL    Refill:  0    Follow-up: Return in about 3 months (around 03/23/2024).    Suzzane MARLA Blanch, MD

## 2023-12-23 ENCOUNTER — Other Ambulatory Visit (HOSPITAL_COMMUNITY): Payer: Self-pay

## 2023-12-23 ENCOUNTER — Telehealth: Payer: Self-pay | Admitting: Pharmacy Technician

## 2023-12-23 NOTE — Telephone Encounter (Signed)
 Pharmacy Patient Advocate Encounter   Received notification from CoverMyMeds that prior authorization for Wegovy  0.25MG /0.5ML auto-injectors is required/requested.   Insurance verification completed.   The patient is insured through [facility] .   Per test claim: PA required; PA submitted to above mentioned insurance via LATENT Key/confirmation #/[id number] Status is pending

## 2023-12-23 NOTE — Telephone Encounter (Signed)
 Pharmacy Patient Advocate Encounter  Received notification from [facility] that Prior Authorization for  Wegovy  0.25MG /0.5ML auto-injectors  has been APPROVED from 12/22/2024 to 06/20/2024. Ran test claim, Copay is $4.00. This test claim was processed through [facility]- copay amounts may vary at other pharmacies due to pharmacy/plan contracts, or as the patient moves through the different stages of their insurance plan.   PA #/Case ID/Reference #: [id number]

## 2024-01-24 ENCOUNTER — Encounter: Payer: Self-pay | Admitting: Internal Medicine

## 2024-01-24 ENCOUNTER — Other Ambulatory Visit: Payer: Self-pay | Admitting: Internal Medicine

## 2024-01-24 MED ORDER — WEGOVY 0.5 MG/0.5ML ~~LOC~~ SOAJ: 0.5000 mg | SUBCUTANEOUS | 0 refills | Status: DC

## 2024-02-03 LAB — LIPID PANEL
Chol/HDL Ratio: 3.9 ratio (ref 0.0–4.4)
Cholesterol, Total: 161 mg/dL (ref 100–199)
HDL: 41 mg/dL (ref >39)
LDL Chol Calc (NIH): 101 mg/dL — ABNORMAL HIGH (ref 0–99)
Triglycerides: 100 mg/dL (ref 0–149)
VLDL Cholesterol Cal: 19 mg/dL (ref 5–40)

## 2024-02-03 LAB — TSH: TSH: 1.15 u[IU]/mL (ref 0.450–4.500)

## 2024-02-03 LAB — T4, FREE: Free T4: 1.22 ng/dL (ref 0.82–1.77)

## 2024-02-09 ENCOUNTER — Encounter: Payer: Self-pay | Admitting: "Endocrinology

## 2024-02-09 ENCOUNTER — Ambulatory Visit: Admitting: "Endocrinology

## 2024-02-09 VITALS — BP 116/72 | HR 88 | Ht 64.0 in | Wt 286.0 lb

## 2024-02-09 DIAGNOSIS — R7303 Prediabetes: Secondary | ICD-10-CM | POA: Diagnosis not present

## 2024-02-09 DIAGNOSIS — E890 Postprocedural hypothyroidism: Secondary | ICD-10-CM | POA: Diagnosis not present

## 2024-02-09 LAB — POCT GLYCOSYLATED HEMOGLOBIN (HGB A1C): HbA1c, POC (prediabetic range): 5.7 % (ref 5.7–6.4)

## 2024-02-09 NOTE — Progress Notes (Signed)
 02/09/2024    Endocrinology follow-up note  Subjective:    Patient ID: Linda Skinner, female    DOB: [birth year], PCP Linda Suzzane POUR, MD.   Past Medical History:  Diagnosis Date   BV (bacterial vaginosis) 11/12/2021   11/12/21 rx flagyl    Encounter for tubal ligation 09/16/2017   Gestational diabetes    metformin    Graves disease    Maternal varicella, non-immune 02/18/2017   Immunize p delivery   Medical history non-contributory    Supervision of high risk pregnancy, antepartum 02/17/2017    [facility] Initiated Care at  [facility] FOB Linda Skinner Dating By LMP c/w 1st trimester U/S 7wk Pap 05/23/15 neg [facility] (got records) GC/CT Initial:   -/-             36+wks:-/- Genetic Screen NT/IT: neg CF screen declined Anatomic US  Normal female Flu vaccine 02/17/17  Tdap Recommended ~ 28wks Glucose Screen  2 hr   99/122/114  GDM GBS pos Feed Preference both Contraception BTL, consent 02/06/1   SVD (spontaneous vaginal delivery) 09/15/2017    Past Surgical History:  Procedure Laterality Date   MOUTH SURGERY     TUBAL LIGATION N/A 09/15/2017   Procedure: POST PARTUM TUBAL LIGATION;  Surgeon: Linda Devaughn Sayres, MD;  Location: [facility] BIRTHING SUITES;  Service: Gynecology;  Laterality: N/A;   WISDOM TOOTH EXTRACTION      Social History   Socioeconomic History   Marital status: Single    Spouse name: Not on file   Number of children: 4   Years of education: Not on file   Highest education level: 12th grade  Occupational History   Not on file  Tobacco Use   Smoking status: Former    Current packs/day: 0.01    Types: Cigars, Cigarettes   Smokeless tobacco: Never  Vaping Use   Vaping status: Never Used  Substance and Sexual Activity   Alcohol use: No   Drug use: No   Sexual activity: Yes    Birth control/protection: Surgical, Pill    Comment: tubal  Other Topics Concern   Not on file  Social History Narrative   Not on file   Social Drivers of Health   Financial Resource  Strain: Low Risk  (12/18/2023)   Overall Financial Resource Strain (CARDIA)    Difficulty of Paying Living Expenses: Not hard at all  Food Insecurity: No Food Insecurity (12/18/2023)   Hunger Vital Sign    Worried About Running Out of Food in the Last Year: Never true    Ran Out of Food in the Last Year: Never true  Transportation Needs: No Transportation Needs (12/18/2023)   PRAPARE - Administrator, Civil Service (Medical): No    Lack of Transportation (Non-Medical): No  Physical Activity: Inactive (12/18/2023)   Exercise Vital Sign    Days of Exercise per Week: 5 days    Minutes of Exercise per Session: 0 min  Stress: Stress Concern Present (12/18/2023)   [name]    Feeling of Stress: Rather much  Social Connections: Socially Isolated (12/18/2023)   Social Connection and Isolation Panel    Frequency of Communication with Friends and Family: Once a week    Frequency of Social Gatherings with Friends and Family: Never    Attends Religious Services: 1 to 4 times per year    Active Member of [employer] or Organizations: No    Attends Banker Meetings:  Not on file    Marital Status: Never married    Family History  Problem Relation Age of Onset   Cancer Paternal Grandfather    Cancer Maternal Grandmother        ovarian   Breast cancer Maternal Grandmother    Atrial fibrillation Maternal Grandmother    Cancer Father    Lupus Mother     Outpatient Encounter Medications as of 02/09/2024  Medication Sig   semaglutide -weight management (WEGOVY ) 0.5 MG/0.5ML SOAJ SQ injection Inject 0.5 mg into the skin every 7 (seven) days.   buPROPion  ER (WELLBUTRIN  SR) 100 MG 12 hr tablet Take 1 tablet (100 mg total) by mouth 2 (two) times daily. (Patient taking differently: Take 100 mg by mouth daily.)   Ferrous Sulfate  (IRON ) 325 (65 Fe) MG TABS Take 1 tablet (325 mg total) by mouth daily.   hydrOXYzine  (ATARAX )  10 MG tablet Take 1 tablet (10 mg total) by mouth 3 (three) times daily as needed.   levothyroxine  (SYNTHROID ) 50 MCG tablet Take 1 tablet (50 mcg total) by mouth daily before breakfast.   metFORMIN  (GLUCOPHAGE -XR) 500 MG 24 hr tablet TAKE ONE TABLET BY MOUTH EVERY DAY WITH BREAKFAST   norethindrone  (MICRONOR ) 0.35 MG tablet Take 1 tablet (0.35 mg total) by mouth daily.   OVER THE COUNTER MEDICATION Women's Multivitamin with Iron    SUMAtriptan  (IMITREX ) 100 MG tablet Take 1 tablet (100 mg total) by mouth every 2 (two) hours as needed for migraine. May repeat in 2 hours if headache persists or recurs.   No facility-administered encounter medications on file as of 02/09/2024.    ALLERGIES: No Known Allergies  VACCINATION STATUS: Immunization History  Administered Date(s) Administered   Influenza,inj,Quad PF,6+ Mos 02/17/2017, 03/01/2020, 04/15/2021   Influenza-Unspecified 03/01/2022, 02/10/2023   PFIZER(Purple Top)SARS-COV-2 Vaccination 02/07/2020, 03/01/2020   PPD Test 03/11/2020, 03/25/2020   Tdap 09/16/2017     HPI  Linda Skinner is 33 y.o. female who presents today with her new labs and uptake and scan of her thyroid .    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 After appropriate work-up confirmed primary hyperthyroidism from Graves' disease, she was treated with I-131 thyroid  ablation in November 2021.  Subsequently, she developed RAI induced hypothyroidism for which she was initiated on levothyroxine  replacement.  She is currently on levothyroxine  50 mcg p.o. daily before breakfast.    Her previsit labs are consistent with under-replacement.     She reports compliance with medication.  She has no new complaints today, presents with steady weight since last visit.  She maintained the changes she made on her lifestyle.  She is tolerating low-dose metformin  given to her due to prediabetes.   She denies palpitations, tremor, nor heat intolerance at this time.  she  denies family history of thyroid  dysfunction. -She denies family hx of thyroid  cancer.  In the interval, she was initiated on Wegovy  by her PMD . her granddaughter this 0.5 mg subcutaneously weekly. -She also has prediabetes with point-of-care A1c of 5.7% today.   She presents with 10 pounds of weight loss since May 2025.                            Review of systems  Constitutional: + Weight gain, + fatigue.      Objective:    BP 116/72   Pulse 88   Ht 5' 4 (1.626 m)   Wt 286 lb (129.7 kg)   BMI 49.09  kg/m   Wt Readings from Last 3 Encounters:  02/09/24 286 lb (129.7 kg)  12/22/23 297 lb 3.2 oz (134.8 kg)  10/01/23 294 lb (133.4 kg)                                                Physical exam  Constitutional: Body mass index is 49.09 kg/m., not in acute distress, + normal state of mind   CMP     Component Value Date/Time   NA 137 10/14/2022 1612   K 4.1 10/14/2022 1612   CL 103 10/14/2022 1612   CO2 21 10/14/2022 1612   GLUCOSE 90 10/14/2022 1612   GLUCOSE 108 (H) 05/05/2018 0750   BUN 9 10/14/2022 1612   CREATININE 0.76 10/14/2022 1612   CALCIUM 9.3 10/14/2022 1612   PROT 7.1 10/14/2022 1612   ALBUMIN 4.3 10/14/2022 1612   AST 19 10/14/2022 1612   ALT 12 10/14/2022 1612   ALKPHOS 51 10/14/2022 1612   BILITOT <0.2 10/14/2022 1612   GFRNONAA 118 03/08/2020 0919   GFRAA 136 03/08/2020 0919     CBC    Component Value Date/Time   WBC 4.5 09/24/2023 1403   WBC 10.3 05/05/2018 0750   RBC 4.11 09/24/2023 1403   RBC 4.48 05/05/2018 0750   HGB 11.3 09/24/2023 1403   HCT 34.9 09/24/2023 1403   PLT 291 09/24/2023 1403   MCV 85 09/24/2023 1403   MCH 27.5 09/24/2023 1403   MCH 25.4 (L) 05/05/2018 0750   MCHC 32.4 09/24/2023 1403   MCHC 31.8 05/05/2018 0750   RDW 13.7 09/24/2023 1403   LYMPHSABS 3.3 (H) 10/14/2022 1612   MONOABS 0.9 05/05/2018 0750   EOSABS 0.1 10/14/2022 1612   BASOSABS 0.0 10/14/2022 1612    Diabetic Labs (most recent): Lab Results   Component Value Date   HGBA1C 5.7 02/09/2024   HGBA1C 5.8 08/04/2023   HGBA1C 5.8 02/03/2023   MICROALBUR 13.7 (H) 03/13/2020    Lipid Panel     Component Value Date/Time   CHOL 161 02/02/2024 1438   TRIG 100 02/02/2024 1438   HDL 41 02/02/2024 1438   CHOLHDL 3.9 02/02/2024 1438   LDLCALC 101 (H) 02/02/2024 1438   LABVLDL 19 02/02/2024 1438     Lab Results  Component Value Date   TSH 1.150 02/02/2024   TSH 5.190 (H) 07/28/2023   TSH 2.670 01/27/2023   TSH 1.800 07/28/2022   TSH 0.070 (L) 03/26/2022   TSH 0.028 (L) 12/25/2021   TSH 0.022 (L) 10/08/2021   TSH 0.047 (L) 09/22/2021   TSH <0.005 (L) 06/24/2021   TSH 1.660 02/21/2021   FREET4 1.22 02/02/2024   FREET4 0.95 07/28/2023   FREET4 1.08 01/27/2023   FREET4 1.11 07/28/2022   FREET4 1.39 03/26/2022   FREET4 1.33 12/25/2021   FREET4 1.55 10/08/2021   FREET4 1.44 09/22/2021   FREET4 1.91 (H) 06/24/2021   FREET4 1.16 02/21/2021    Thyroid  uptake and scan on April 10, 2020 FINDINGS: Homogeneous tracer distribution in both thyroid  lobes.  No focal areas of increased or decreased tracer localization.  4 hour I-123 uptake = 23.7% (normal 5-20%)  ,  24 hour I-123 uptake = 46.9% (normal 10-30%)   IMPRESSION: Normal thyroid  scan.  Elevated 4 hour and 24 hour radio iodine uptakes.  Findings consistent with Graves disease.   Assessment & Plan:  1.  RAI induced hypothyroidism 2.  Graves' disease-resolved Her previsit thyroid  function tests are consistent with appropriate replacement.  She is advised to continue levothyroxine  50 mcg p.o. daily before breakfast.     - We discussed about the correct intake of her thyroid  hormone, on empty stomach at fasting, with water, separated by at least 30 minutes from breakfast and other medications,  and separated by more than 4 hours from calcium, iron , multivitamins, acid reflux medications (PPIs). -Patient is made aware of the fact that thyroid  hormone replacement is  needed for life, dose to be adjusted by periodic monitoring of thyroid  function tests.  In light of the fact that she has prediabetes with point-of-care A1c of 5.7% today, she will continue to benefit from Wegovy .  She may be considered for higher dose of Wegovy  every 4 weeks if she tolerates . She will continue to benefit from low-dose metformin , advised to continue metformin  500 mg XR p.o. daily at breakfast.    - she acknowledges that there is a room for improvement in her food and drink choices. - Suggestion is made for her to avoid simple carbohydrates  from her diet including Cakes, Sweet Desserts, Ice Cream, Soda (diet and regular), Sweet Tea, Candies, Chips, Cookies, Store Bought Juices, Alcohol , Artificial Sweeteners,  Coffee Creamer, and Sugar-free Products, Lemonade. This will help patient to have more stable blood glucose profile and potentially avoid unintended weight gain.  The following Lifestyle Medicine recommendations according to American College of Lifestyle Medicine  [facility]) were discussed and and offered to patient and she  agrees to start the journey:  A. Whole Foods, Plant-Based Nutrition comprising of fruits and vegetables, plant-based proteins, whole-grain carbohydrates was discussed in detail with the patient.   A list for source of those nutrients were also provided to the patient.  Patient will use only water or unsweetened tea for hydration. B.  The need to stay away from risky substances including alcohol, smoking; obtaining 7 to 9 hours of restorative sleep, at least 150 minutes of moderate intensity exercise weekly, the importance of healthy social connections,  and stress management techniques were discussed. C.  A full color page of  Calorie density of various food groups per pound showing examples of each food groups was provided to the patient.  He is advised to finish and stop her vitamin D2 supplements.  She may need maintenance with vitamin D3 after next  measurement.     -Patient is advised to maintain close follow up with Linda Suzzane POUR, MD for primary care needs.   I spent  20  minutes in the care of the patient today including review of labs from Thyroid  Function, CMP, and other relevant labs ; imaging/biopsy records (current and previous including abstractions from other facilities); face-to-face time discussing  her lab results and symptoms, medications doses, her options of short and long term treatment based on the latest standards of care / guidelines;   and documenting the encounter.  Linda Skinner  participated in the discussions, expressed understanding, and voiced agreement with the above plans.  All questions were answered to her satisfaction. she is encouraged to contact clinic should she have any questions or concerns prior to her return visit.   Follow up plan: Return in about 6 months (around 08/08/2024) for F/U with Pre-visit Labs.   Thank you for involving me in the care of this pleasant patient, and I will continue to update you with her progress.  Ranny Earl, MD  [facility] Phone: [phone]  Fax: [phone]   02/09/2024, 2:05 PM  This note was partially dictated with voice recognition software. Similar sounding words can be transcribed inadequately or may not  be corrected upon review.

## 2024-02-09 NOTE — Patient Instructions (Signed)

## 2024-02-10 ENCOUNTER — Ambulatory Visit
Admission: EM | Admit: 2024-02-10 | Discharge: 2024-02-10 | Disposition: A | Discharge status: Home / Self Care | Attending: Family Medicine | Admitting: Family Medicine

## 2024-02-10 ENCOUNTER — Other Ambulatory Visit: Payer: Self-pay

## 2024-02-10 ENCOUNTER — Encounter: Payer: Self-pay | Admitting: Emergency Medicine

## 2024-02-10 DIAGNOSIS — R6889 Other general symptoms and signs: Secondary | ICD-10-CM

## 2024-02-10 DIAGNOSIS — U071 COVID-19: Secondary | ICD-10-CM | POA: Diagnosis not present

## 2024-02-10 LAB — POC COVID19/FLU A&B COMBO
Covid Antigen, POC: POSITIVE — AB
Influenza A Antigen, POC: NEGATIVE
Influenza B Antigen, POC: NEGATIVE

## 2024-02-10 NOTE — Discharge Instructions (Signed)
 Results for orders placed or performed during the hospital encounter of 02/10/24  POC Covid19/Flu A&B Antigen   Collection Time: 02/10/24  7:01 PM  Result Value Ref Range   Influenza A Antigen, POC Negative Negative   Influenza B Antigen, POC Negative Negative   Covid Antigen, POC Positive (A) Negative

## 2024-02-10 NOTE — ED Triage Notes (Signed)
 Pt reports cough, body aches, headache since today. Reports family has been exposed to covid.

## 2024-02-10 NOTE — ED Provider Notes (Signed)
 [facility] CARE CENTER   [id number] 02/10/24 Arrival Time: 1823  ASSESSMENT & PLAN:  1. Not feeling great   2. COVID-19 virus infection    Discussed typical duration of likely viral illness. Results for orders placed or performed during the hospital encounter of 02/10/24  POC Covid19/Flu A&B Antigen   Collection Time: 02/10/24  7:01 PM  Result Value Ref Range   Influenza A Antigen, POC Negative Negative   Influenza B Antigen, POC Negative Negative   Covid Antigen, POC Positive (A) Negative   Prefers OTC symptom care as needed.   Follow-up Information     [name], MD.   Specialty: Internal Medicine Why: As needed. Contact information: [address] [place] [address] [phone]                 Reviewed expectations re: course of current medical issues. Questions answered. Outlined signs and symptoms indicating need for more acute intervention. Understanding verbalized. After Visit Summary given.   SUBJECTIVE: History from: Patient. Linda Skinner is a 33 y.o. female. Pt reports cough, body aches, headache since today. Reports family has been exposed to covid. Daughter with same symptoms. Denies: fever.. Normal PO intake without n/v/d.  OBJECTIVE:  Vitals:   02/10/24 1851  BP: 126/84  Pulse: 87  Resp: 18  Temp: 98.6 F (37 C)  TempSrc: Oral  SpO2: 98%    General appearance: alert; no distress Eyes: PERRLA; EOMI; conjunctiva normal HENT: [place]; AT; with nasal congestion Neck: supple  Lungs: speaks full sentences without difficulty; unlabored Extremities: no edema Skin: warm and dry Neurologic: normal gait Psychological: alert and cooperative; normal mood and affect  Labs: Results for orders placed or performed during the hospital encounter of 02/10/24  POC Covid19/Flu A&B Antigen   Collection Time: 02/10/24  7:01 PM  Result Value Ref Range   Influenza A Antigen, POC Negative Negative   Influenza B Antigen, POC Negative Negative    Covid Antigen, POC Positive (A) Negative   Labs Reviewed  POC COVID19/FLU A&B COMBO - Abnormal; Notable for the following components:      Result Value   Covid Antigen, POC Positive (*)    All other components within normal limits    Imaging: No results found.  No Known Allergies  Past Medical History:  Diagnosis Date   BV (bacterial vaginosis) 11/12/2021   11/12/21 rx flagyl    Encounter for tubal ligation 09/16/2017   Gestational diabetes    metformin    Graves disease    Maternal varicella, non-immune 02/18/2017   Immunize p delivery   Medical history non-contributory    Supervision of high risk pregnancy, antepartum 02/17/2017    Clinic [facility] Initiated Care at  [facility] FOB Ozell Poteat Dating By LMP c/w 1st trimester U/S 7wk Pap 05/23/15 neg [facility] (got records) GC/CT Initial:   -/-             36+wks:-/- Genetic Screen NT/IT: neg CF screen declined Anatomic US  Normal female Flu vaccine 02/17/17  Tdap Recommended ~ 28wks Glucose Screen  2 hr   99/122/114  GDM GBS pos Feed Preference both Contraception BTL, consent 02/06/1   SVD (spontaneous vaginal delivery) 09/15/2017   Social History   Socioeconomic History   Marital status: Single    Spouse name: Not on file   Number of children: 4   Years of education: Not on file   Highest education level: 12th grade  Occupational History   Not on file  Tobacco Use  Smoking status: Former    Current packs/day: 0.01    Types: Cigars, Cigarettes   Smokeless tobacco: Never  Vaping Use   Vaping status: Never Used  Substance and Sexual Activity   Alcohol use: No   Drug use: No   Sexual activity: Yes    Birth control/protection: Surgical, Pill    Comment: tubal  Other Topics Concern   Not on file  Social History Narrative   Not on file   Social Drivers of Health   Financial Resource Strain: Low Risk  (12/18/2023)   Overall Financial Resource Strain (CARDIA)    Difficulty of Paying Living Expenses: Not hard at all  Food  Insecurity: No Food Insecurity (12/18/2023)   Hunger Vital Sign    Worried About Running Out of Food in the Last Year: Never true    Ran Out of Food in the Last Year: Never true  Transportation Needs: No Transportation Needs (12/18/2023)   PRAPARE - Administrator, Civil Service (Medical): No    Lack of Transportation (Non-Medical): No  Physical Activity: Inactive (12/18/2023)   Exercise Vital Sign    Days of Exercise per Week: 5 days    Minutes of Exercise per Session: 0 min  Stress: Stress Concern Present (12/18/2023)   [name]    Feeling of Stress: Rather much  Social Connections: Socially Isolated (12/18/2023)   Social Connection and Isolation Panel    Frequency of Communication with Friends and Family: Once a week    Frequency of Social Gatherings with Friends and Family: Never    Attends Religious Services: 1 to 4 times per year    Active Member of [employer] or Organizations: No    Attends Engineer, structural: Not on file    Marital Status: Never married  Intimate Partner Violence: Not At Risk (03/05/2023)   Humiliation, Afraid, Rape, and Kick questionnaire    Fear of Current or Ex-Partner: No    Emotionally Abused: No    Physically Abused: No    Sexually Abused: No   Family History  Problem Relation Age of Onset   Cancer Paternal Grandfather    Cancer Maternal Grandmother        ovarian   Breast cancer Maternal Grandmother    Atrial fibrillation Maternal Grandmother    Cancer Father    Lupus Mother    Past Surgical History:  Procedure Laterality Date   MOUTH SURGERY     TUBAL LIGATION N/A 09/15/2017   Procedure: POST PARTUM TUBAL LIGATION;  Surgeon: Kandis Devaughn Sayres, MD;  Location: [facility]IRTHING SU[facility];  Service: Gynecology;  Laterality: N/A;   WISDOM TOOTH EXTRACTION       Rolinda Rogue, MD 02/10/24 [phone]

## 2024-02-21 ENCOUNTER — Other Ambulatory Visit: Payer: Self-pay | Admitting: Internal Medicine

## 2024-02-21 MED ORDER — WEGOVY 1 MG/0.5ML ~~LOC~~ SOAJ: 1.0000 mg | SUBCUTANEOUS | 0 refills | Status: AC

## 2024-02-21 MED ORDER — WEGOVY 1 MG/0.5ML ~~LOC~~ SOAJ: 1.0000 mg | SUBCUTANEOUS | 0 refills | Status: DC

## 2024-03-01 ENCOUNTER — Ambulatory Visit

## 2024-03-16 LAB — CBC WITH DIFFERENTIAL/PLATELET
Basophils Absolute: 0 x10E3/uL (ref 0.0–0.2)
Basos: 0 %
EOS (ABSOLUTE): 0.2 x10E3/uL (ref 0.0–0.4)
Eos: 3 %
Hematocrit: 34.4 % (ref 34.0–46.6)
Hemoglobin: 11 g/dL — ABNORMAL LOW (ref 11.1–15.9)
Immature Grans (Abs): 0 x10E3/uL (ref 0.0–0.1)
Immature Granulocytes: 0 %
Lymphocytes Absolute: 2.7 x10E3/uL (ref 0.7–3.1)
Lymphs: 52 %
MCH: 27.4 pg (ref 26.6–33.0)
MCHC: 32 g/dL (ref 31.5–35.7)
MCV: 86 fL (ref 79–97)
Monocytes Absolute: 0.4 x10E3/uL (ref 0.1–0.9)
Monocytes: 7 %
Neutrophils Absolute: 2 x10E3/uL (ref 1.4–7.0)
Neutrophils: 38 %
Platelets: 300 x10E3/uL (ref 150–450)
RBC: 4.01 x10E6/uL (ref 3.77–5.28)
RDW: 13.8 % (ref 11.7–15.4)
WBC: 5.3 x10E3/uL (ref 3.4–10.8)

## 2024-03-16 LAB — CMP14+EGFR
ALT: 10 IU/L (ref 0–32)
AST: 15 IU/L (ref 0–40)
Albumin: 4.4 g/dL (ref 3.9–4.9)
Alkaline Phosphatase: 47 IU/L (ref 41–116)
BUN/Creatinine Ratio: 10 (ref 9–23)
BUN: 8 mg/dL (ref 6–20)
Bilirubin Total: <0.2 mg/dL (ref 0.0–1.2)
CO2: 21 mmol/L (ref 20–29)
Calcium: 9.4 mg/dL (ref 8.7–10.2)
Chloride: 102 mmol/L (ref 96–106)
Creatinine, Ser: 0.79 mg/dL (ref 0.57–1.00)
Globulin, Total: 2.9 g/dL (ref 1.5–4.5)
Glucose: 90 mg/dL (ref 70–99)
Potassium: 4 mmol/L (ref 3.5–5.2)
Sodium: 138 mmol/L (ref 134–144)
Total Protein: 7.3 g/dL (ref 6.0–8.5)
eGFR: 101 mL/min/1.73 (ref >59)

## 2024-03-16 LAB — VITAMIN D 25 HYDROXY (VIT D DEFICIENCY, FRACTURES): Vit D, 25-Hydroxy: 23.4 ng/mL — ABNORMAL LOW (ref 30.0–100.0)

## 2024-03-16 LAB — HEMOGLOBIN A1C
Est. average glucose Bld gHb Est-mCnc: 117 mg/dL
Hgb A1c MFr Bld: 5.7 % — ABNORMAL HIGH (ref 4.8–5.6)

## 2024-03-22 ENCOUNTER — Ambulatory Visit: Admitting: Internal Medicine

## 2024-03-22 VITALS — BP 114/81 | HR 81 | Ht 64.0 in | Wt 279.0 lb

## 2024-03-22 DIAGNOSIS — G43109 Migraine with aura, not intractable, without status migrainosus: Secondary | ICD-10-CM

## 2024-03-22 DIAGNOSIS — Z23 Encounter for immunization: Secondary | ICD-10-CM | POA: Diagnosis not present

## 2024-03-22 DIAGNOSIS — E890 Postprocedural hypothyroidism: Secondary | ICD-10-CM

## 2024-03-22 DIAGNOSIS — F331 Major depressive disorder, recurrent, moderate: Secondary | ICD-10-CM

## 2024-03-22 DIAGNOSIS — Z0001 Encounter for general adult medical examination with abnormal findings: Secondary | ICD-10-CM

## 2024-03-22 NOTE — Progress Notes (Signed)
 Established Patient Office Visit  Subjective:  Patient ID: Linda Skinner, female    DOB: 1991-05-06  Age: 33 y.o. MRN: [id number]  CC:  Chief Complaint  Patient presents with   Medical Management of Chronic Issues    3 month f/u     HPI Linda Skinner is a 33 y.o. female with past medical history of Grave's disease and major depressive disorder who presents for f/u of her chronic medical conditions.  Morbid obesity: She has been able to lose 18 lbs weight since the last visit.  She admits that she has improved her diet and has been trying intermittent fasting. She agrees to strictly follow low-carb diet for now.  She has been placed on metformin  for prediabetes by endocrinology, but had not noticed much weight loss with it. Of note, she has lost coverage for Wegovy  now.  Hypothyroidism: She had radioactive iodine therapy for Graves' disease in 04/2020.  She is on Levothyroxine  50 mcg QD.  Depression: She has been taking Wellbutrin  100 mg once daily for depression and has noticed improvement of her symptoms recently. She had been stressed due to her grandmother's cancer diagnosis in the last visit. She was nervous around her mother's death anniversary.  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 Denies any SI or HI currently.  Migraine: She has unilateral headache, lasting for few hours, recurrent, but have been less frequent now, about 4-5 episodes in a month, and associated with photophobia and nausea.  She has tried Tylenol , ibuprofen  and other OTC migraine medicines without much relief. She responded well to Sumatriptan , but did not get Topiramate . She has felt better after going in a dark room or spell of vomiting.  She also reports blurry vision and floaters at times.  She has family history of migraine as well.   Past Medical History:  Diagnosis Date   BV (bacterial vaginosis) 11/12/2021   11/12/21 rx flagyl    Encounter for  tubal ligation 09/16/2017   Gestational diabetes    metformin    Graves disease    Maternal varicella, non-immune 02/18/2017   Immunize p delivery   Medical history non-contributory    Supervision of high risk pregnancy, antepartum 02/17/2017    [facility] Initiated Care at  [facility] FOB Linda Skinner Dating By LMP c/w 1st trimester U/S 7wk Pap 05/23/15 neg [facility] (got records) GC/CT Initial:   -/-             36+wks:-/- Genetic Screen NT/IT: neg CF screen declined Anatomic US  Normal female Flu vaccine 02/17/17  Tdap Recommended ~ 28wks Glucose Screen  2 hr   99/122/114  GDM GBS pos Feed Preference both Contraception BTL, consent 02/06/1   SVD (spontaneous vaginal delivery) 09/15/2017    Past Surgical History:  Procedure Laterality Date   MOUTH SURGERY     TUBAL LIGATION N/A 09/15/2017   Procedure: POST PARTUM TUBAL LIGATION;  Surgeon: Kandis Devaughn Sayres, MD;  Location: [facility];  Service: Gynecology;  Laterality: N/A;   WISDOM TOOTH EXTRACTION      Family History  Problem Relation Age of Onset   Cancer Paternal Grandfather    Cancer Maternal Grandmother        ovarian   Breast cancer Maternal Grandmother    Atrial fibrillation Maternal Grandmother    Cancer Father    Lupus Mother     Social History   Socioeconomic History   Marital status: Single  Spouse name: Not on file   Number of children: 4   Years of education: Not on file   Highest education level: Some college, no degree  Occupational History   Not on file  Tobacco Use   Smoking status: Former    Current packs/day: 0.01    Types: Cigars, Cigarettes   Smokeless tobacco: Never  Vaping Use   Vaping status: Never Used  Substance and Sexual Activity   Alcohol use: No   Drug use: No   Sexual activity: Yes    Birth control/protection: Surgical, Pill    Comment: tubal  Other Topics Concern   Not on file  Social History Narrative   Not on file   Social Drivers of Health   Financial Resource Strain:  Low Risk  (03/22/2024)   Overall Financial Resource Strain (CARDIA)    Difficulty of Paying Living Expenses: Not hard at all  Food Insecurity: No Food Insecurity (03/22/2024)   Hunger Vital Sign    Worried About Running Out of Food in the Last Year: Never true    Ran Out of Food in the Last Year: Never true  Transportation Needs: No Transportation Needs (03/22/2024)   PRAPARE - Administrator, Civil Service (Medical): No    Lack of Transportation (Non-Medical): No  Physical Activity: Sufficiently Active (03/22/2024)   Exercise Vital Sign    Days of Exercise per Week: 7 days    Minutes of Exercise per Session: 40 min  Stress: Stress Concern Present (03/22/2024)   [name]    Feeling of Stress: Rather much  Social Connections: Socially Isolated (03/22/2024)   Social Connection and Isolation Panel    Frequency of Communication with Friends and Family: Three times a week    Frequency of Social Gatherings with Friends and Family: Once a week    Attends Religious Services: Never    Database administrator or Organizations: No    Attends Engineer, structural: Not on file    Marital Status: Never married  Intimate Partner Violence: Not At Risk (03/05/2023)   Humiliation, Afraid, Rape, and Kick questionnaire    Fear of Current or Ex-Partner: No    Emotionally Abused: No    Physically Abused: No    Sexually Abused: No    Outpatient Medications Prior to Visit  Medication Sig Dispense Refill   buPROPion  ER (WELLBUTRIN  SR) 100 MG 12 hr tablet Take 1 tablet (100 mg total) by mouth 2 (two) times daily. (Patient taking differently: Take 100 mg by mouth daily.) 180 tablet 0   Ferrous Sulfate  (IRON ) 325 (65 Fe) MG TABS Take 1 tablet (325 mg total) by mouth daily.     hydrOXYzine  (ATARAX ) 10 MG tablet Take 1 tablet (10 mg total) by mouth 3 (three) times daily as needed. 60 tablet 3   levothyroxine  (SYNTHROID ) 50  MCG tablet Take 1 tablet (50 mcg total) by mouth daily before breakfast. 90 tablet 1   metFORMIN  (GLUCOPHAGE -XR) 500 MG 24 hr tablet TAKE ONE TABLET BY MOUTH EVERY DAY WITH BREAKFAST 90 tablet 1   norethindrone  (MICRONOR ) 0.35 MG tablet Take 1 tablet (0.35 mg total) by mouth daily. 28 tablet 11   OVER THE COUNTER MEDICATION Women's Multivitamin with Iron      semaglutide -weight management (WEGOVY ) 1 MG/0.5ML SOAJ SQ injection Inject 1 mg into the skin every 7 (seven) days. 2 mL 0   SUMAtriptan  (IMITREX ) 100 MG tablet Take 1 tablet (100 mg  total) by mouth every 2 (two) hours as needed for migraine. May repeat in 2 hours if headache persists or recurs. 10 tablet 2   No facility-administered medications prior to visit.    No Known Allergies  ROS Review of Systems  Constitutional:  Negative for chills and fever.  HENT:  Negative for congestion, sinus pressure, sinus pain and sore throat.   Eyes:  Negative for pain and discharge.  Respiratory:  Negative for cough and shortness of breath.   Cardiovascular:  Negative for chest pain and palpitations.  Gastrointestinal:  Negative for abdominal pain, diarrhea, nausea and vomiting.  Endocrine: Negative for polydipsia and polyuria.  Genitourinary:  Negative for dysuria and hematuria.  Musculoskeletal:  Negative for neck pain and neck stiffness.  Skin:  Negative for rash.  Neurological:  Positive for headaches. Negative for dizziness and weakness.  Psychiatric/Behavioral:  Negative for agitation, sleep disturbance and suicidal ideas.       Objective:    Physical Exam Vitals reviewed.  Constitutional:      General: She is not in acute distress.    Appearance: She is obese. She is not diaphoretic.  HENT:     Head: Normocephalic and atraumatic.     Nose: Nose normal.     Mouth/Throat:     Mouth: Mucous membranes are moist.  Eyes:     General: No scleral icterus.    Extraocular Movements: Extraocular movements intact.  Cardiovascular:      Rate and Rhythm: Normal rate and regular rhythm.     Heart sounds: Normal heart sounds. No murmur heard. Pulmonary:     Breath sounds: Normal breath sounds. No wheezing or rales.  Abdominal:     Palpations: Abdomen is soft.     Tenderness: There is no abdominal tenderness.  Musculoskeletal:     Right wrist: Tenderness present. No swelling.     Cervical back: Neck supple. No tenderness.     Right lower leg: No edema.     Left lower leg: No edema.  Skin:    General: Skin is warm.     Coloration: Skin is not pale.  Neurological:     General: No focal deficit present.     Mental Status: She is alert and oriented to person, place, and time.     Cranial Nerves: No cranial nerve deficit.     Sensory: No sensory deficit.     Motor: No weakness.  Psychiatric:        Mood and Affect: Mood normal.        Behavior: Behavior normal.     BP 114/81   Pulse 81   Ht 5' 4 (1.626 m)   Wt 279 lb (126.6 kg)   SpO2 100%   BMI 47.89 kg/m  Wt Readings from Last 3 Encounters:  03/22/24 279 lb (126.6 kg)  02/09/24 286 lb (129.7 kg)  12/22/23 297 lb 3.2 oz (134.8 kg)    Lab Results  Component Value Date   TSH 1.150 02/02/2024   Lab Results  Component Value Date   WBC 5.3 03/15/2024   HGB 11.0 (L) 03/15/2024   HCT 34.4 03/15/2024   MCV 86 03/15/2024   PLT 300 03/15/2024   Lab Results  Component Value Date   NA 138 03/15/2024   K 4.0 03/15/2024   CO2 21 03/15/2024   GLUCOSE 90 03/15/2024   BUN 8 03/15/2024   CREATININE 0.79 03/15/2024   BILITOT <0.2 03/15/2024   ALKPHOS 47 03/15/2024   AST 15  03/15/2024   ALT 10 03/15/2024   PROT 7.3 03/15/2024   ALBUMIN 4.4 03/15/2024   CALCIUM 9.4 03/15/2024   ANIONGAP 9 05/05/2018   EGFR 101 03/15/2024   Lab Results  Component Value Date   CHOL 161 02/02/2024   Lab Results  Component Value Date   HDL 41 02/02/2024   Lab Results  Component Value Date   LDLCALC 101 (H) 02/02/2024   Lab Results  Component Value Date   TRIG 100  02/02/2024   Lab Results  Component Value Date   CHOLHDL 3.9 02/02/2024   Lab Results  Component Value Date   HGBA1C 5.7 (H) 03/15/2024      Assessment & Plan:   Problem List Items Addressed This Visit       Cardiovascular and Mediastinum   Migraine with aura and without status migrainosus, not intractable - Primary   Longstanding history, has tried OTC Tylenol , ibuprofen  and other migraine medicines Has sumatriptan  100 mg as needed for breakthrough headache        Endocrine   Hypothyroidism   Lab Results  Component Value Date   TSH 1.150 02/02/2024   S/p radioactive ablation On Levothyroxine  50 mcg once daily now Followed by Endocrinology        Other   Depression, major, recurrent, moderate (HCC)      03/22/2024    3:36 PM 12/22/2023    2:57 PM 10/01/2023   10:16 AM  PHQ9 SCORE ONLY  PHQ-9 Total Score 0 0  9      Data saved with a previous flowsheet row definition   Well-controlled with Wellbutrin  100 mg BID Needs to take it BID, symptoms had improved with BID dosing in the past Has seasonal variations      Morbid obesity (HCC)   BMI Readings from Last 3 Encounters:  03/22/24 47.89 kg/m  02/09/24 49.09 kg/m  12/22/23 51.01 kg/m   Advised to continue to follow low-carb diet and perform moderate exercise/walking at least 150 minutes/week She has tried low-carb diet and intermittent fasting for more than 6 months now She would be a good candidate for GLP-1 agonist therapy, tolerated Wegovy  well - initial BMI: 51.01, she lost coverage of Wegovy , advised to take remaining doses every other week for now Continue metformin , followed by endocrinology      Encounter for general adult medical examination with abnormal findings   Physical exam as mentioned above. Recent blood tests reviewed from chart. PAP smear in 10/24      Other Visit Diagnoses       Immunization due       Relevant Orders   HPV 9-valent vaccine,Recombinat (Completed)           No orders of the defined types were placed in this encounter.   Follow-up: Return in about 6 months (around 09/20/2024).    Suzzane MARLA Blanch, MD

## 2024-03-22 NOTE — Assessment & Plan Note (Signed)
 Longstanding history, has tried OTC Tylenol , ibuprofen  and other migraine medicines Has sumatriptan  100 mg as needed for breakthrough headache

## 2024-03-22 NOTE — Patient Instructions (Signed)
 Please start taking Slow Fe once daily.  Okay to take Wegovy  every other week for now.  Please continue to take medications as prescribed.  Please continue to follow low carb diet and perform moderate exercise/walking at least 150 mins/week.

## 2024-03-22 NOTE — Assessment & Plan Note (Signed)
 Lab Results  Component Value Date   TSH 1.150 02/02/2024   S/p radioactive ablation On Levothyroxine  50 mcg once daily now Followed by Endocrinology

## 2024-03-22 NOTE — Assessment & Plan Note (Signed)
 BMI Readings from Last 3 Encounters:  03/22/24 47.89 kg/m  02/09/24 49.09 kg/m  12/22/23 51.01 kg/m   Advised to continue to follow low-carb diet and perform moderate exercise/walking at least 150 minutes/week She has tried low-carb diet and intermittent fasting for more than 6 months now She would be a good candidate for GLP-1 agonist therapy, tolerated Wegovy  well - initial BMI: 51.01, she lost coverage of Wegovy , advised to take remaining doses every other week for now Continue metformin , followed by endocrinology

## 2024-03-22 NOTE — Assessment & Plan Note (Signed)
    03/22/2024    3:36 PM 12/22/2023    2:57 PM 10/01/2023   10:16 AM  PHQ9 SCORE ONLY  PHQ-9 Total Score 0 0  9      Data saved with a previous flowsheet row definition   Well-controlled with Wellbutrin  100 mg BID Needs to take it BID, symptoms had improved with BID dosing in the past Has seasonal variations

## 2024-03-22 NOTE — Assessment & Plan Note (Addendum)
 Physical exam as mentioned above. Recent blood tests reviewed from chart. PAP smear in 10/24

## 2024-03-25 ENCOUNTER — Other Ambulatory Visit: Payer: Self-pay | Admitting: "Endocrinology

## 2024-03-25 DIAGNOSIS — E0500 Thyrotoxicosis with diffuse goiter without thyrotoxic crisis or storm: Secondary | ICD-10-CM

## 2024-04-05 ENCOUNTER — Ambulatory Visit
Admission: RE | Admit: 2024-04-05 | Discharge: 2024-04-05 | Disposition: A | Discharge status: Home / Self Care | Attending: Family Medicine | Admitting: Family Medicine

## 2024-04-05 VITALS — BP 124/87 | HR 84 | Temp 98.6°F | Resp 16

## 2024-04-05 DIAGNOSIS — Z113 Encounter for screening for infections with a predominantly sexual mode of transmission: Secondary | ICD-10-CM | POA: Insufficient documentation

## 2024-04-05 DIAGNOSIS — N760 Acute vaginitis: Secondary | ICD-10-CM | POA: Diagnosis not present

## 2024-04-05 NOTE — ED Triage Notes (Addendum)
 Pt reports abnormal vaginal discharge,it is thick and white, pt is  wanting both swab and blood work for [place].

## 2024-04-05 NOTE — ED Provider Notes (Signed)
 [facility]    CSN: [id number] Arrival date & time: 04/05/24  1325      History   Chief Complaint Chief Complaint  Patient presents with   Vaginal Discharge    STD testing and blood work as well if possible - Entered by patient    HPI Linda Skinner is a 33 y.o. female.   Patient presenting today with several day history of white vaginal discharge.  Denies itching, rashes, pain, pelvic or abdominal pain, nausea, vomiting, urinary symptoms.  Requesting STD screening as she recently started seeing a new partner.    Past Medical History:  Diagnosis Date   BV (bacterial vaginosis) 11/12/2021   11/12/21 rx flagyl    Encounter for tubal ligation 09/16/2017   Gestational diabetes    metformin    Graves disease    Maternal varicella, non-immune 02/18/2017   Immunize p delivery   Medical history non-contributory    Supervision of high risk pregnancy, antepartum 02/17/2017    [facility] Initiated Care at  [facility] FOB Ozell Poteat Dating By LMP c/w 1st trimester U/S 7wk Pap 05/23/15 neg [facility] (got records) GC/CT Initial:   -/-             36+wks:-/- Genetic Screen NT/IT: neg CF screen declined Anatomic US  Normal female Flu vaccine 02/17/17  Tdap Recommended ~ 28wks Glucose Screen  2 hr   99/122/114  GDM GBS pos Feed Preference both Contraception BTL, consent 02/06/1   SVD (spontaneous vaginal delivery) 09/15/2017    Patient Active Problem List   Diagnosis Date Noted   Carpal tunnel syndrome of right wrist 12/22/2023   Trichomoniasis 10/04/2023   Screening for STD (sexually transmitted disease) 10/01/2023   Vaginal odor 10/01/2023   Snoring 08/19/2023   Iron  deficiency anemia due to chronic blood loss 06/04/2023   Anxiety and depression 03/05/2023   Menorrhagia with irregular cycle 03/05/2023   Migraine with aura and without status migrainosus, not intractable 03/05/2023   Right knee pain 06/04/2022   Vitamin D  deficiency 04/02/2022   Hypothyroidism following  radioiodine therapy 09/29/2021   Mixed hyperlipidemia 09/29/2021   Hypothyroidism 04/15/2021   Encounter for general adult medical examination with abnormal findings 04/15/2021   Prediabetes 04/15/2021   Tinea versicolor 01/13/2021   Graves disease 04/12/2020   Depression, major, recurrent, moderate (HCC) 03/08/2020   Morbid obesity (HCC) 03/08/2020   Gestational diabetes mellitus, class A2 06/17/2017   Ovarian cyst 05/26/2017    Past Surgical History:  Procedure Laterality Date   MOUTH SURGERY     TUBAL LIGATION N/A 09/15/2017   Procedure: POST PARTUM TUBAL LIGATION;  Surgeon: Kandis Devaughn Sayres, MD;  Location: [facility];  Service: Gynecology;  Laterality: N/A;   WISDOM TOOTH EXTRACTION      OB History     Gravida  6   Para  4   Term  4   Preterm      AB  2   Living  4      SAB      IAB      Ectopic      Multiple  0   Live Births  4            Home Medications    Prior to Admission medications   Medication Sig Start Date End Date Taking? Authorizing Provider  buPROPion  ER (WELLBUTRIN  SR) 100 MG 12 hr tablet Take 1 tablet (100 mg total) by mouth 2 (two) times daily. Patient taking differently: Take 100 mg  by mouth daily. 08/23/23 03/22/24  [name], MD  Ferrous Sulfate  (IRON ) 325 (65 Fe) MG TABS Take 1 tablet (325 mg total) by mouth daily. 06/04/23   Signa Delon LABOR, NP  hydrOXYzine  (ATARAX ) 10 MG tablet Take 1 tablet (10 mg total) by mouth 3 (three) times daily as needed. 06/04/23   Signa Delon LABOR, NP  levothyroxine  (SYNTHROID ) 50 MCG tablet TAKE 1 TABLET BY MOUTH DAILY BEFORE BREAKFAST 03/27/24   Nida, Gebreselassie W, MD  metFORMIN  (GLUCOPHAGE -XR) 500 MG 24 hr tablet TAKE ONE TABLET BY MOUTH EVERY DAY WITH BREAKFAST 08/24/23   Nida, Gebreselassie W, MD  norethindrone  (MICRONOR ) 0.35 MG tablet Take 1 tablet (0.35 mg total) by mouth daily. 03/05/23   Signa Delon LABOR, NP  OVER THE COUNTER MEDICATION Women's Multivitamin with Iron      [provider]  semaglutide -weight management (WEGOVY ) 1 MG/0.5ML SOAJ SQ injection Inject 1 mg into the skin every 7 (seven) days. 02/21/24   [name], MD  SUMAtriptan  (IMITREX ) 100 MG tablet Take 1 tablet (100 mg total) by mouth every 2 (two) hours as needed for migraine. May repeat in 2 hours if headache persists or recurs. 08/18/23   [name], MD    Family History Family History  Problem Relation Age of Onset   Cancer Paternal Grandfather    Cancer Maternal Grandmother        ovarian   Breast cancer Maternal Grandmother    Atrial fibrillation Maternal Grandmother    Cancer Father    Lupus Mother     Social History Social History   Tobacco Use   Smoking status: Former    Current packs/day: 0.01    Types: Cigars, Cigarettes   Smokeless tobacco: Never  Vaping Use   Vaping status: Never Used  Substance Use Topics   Alcohol use: No   Drug use: No     Allergies   Patient has no known allergies.   Review of Systems Review of Systems Per HPI  Physical Exam Triage Vital Signs ED Triage Vitals  Encounter Vitals Group     BP 04/05/24 1347 124/87     Girls Systolic BP Percentile --      Girls Diastolic BP Percentile --      Boys Systolic BP Percentile --      Boys Diastolic BP Percentile --      Pulse Rate 04/05/24 1347 84     Resp 04/05/24 1347 16     Temp 04/05/24 1347 98.6 F (37 C)     Temp Source 04/05/24 1347 Oral     SpO2 04/05/24 1347 97 %     Weight --      Height --      Head Circumference --      Peak Flow --      Pain Score 04/05/24 1348 0     Pain Loc --      Pain Education --      Exclude from Growth Chart --    No data found.  Updated Vital Signs BP 124/87 (BP Location: Right Arm)   Pulse 84   Temp 98.6 F (37 C) (Oral)   Resp 16   LMP 03/09/2024 (Exact Date)   SpO2 97%   Visual Acuity Right Eye Distance:   Left Eye Distance:   Bilateral Distance:    Right Eye Near:   Left Eye Near:    Bilateral Near:      Physical Exam Vitals and nursing note  reviewed.  Constitutional:      Appearance: Normal appearance. She is not ill-appearing.  HENT:     Head: Atraumatic.  Eyes:     Extraocular Movements: Extraocular movements intact.     Conjunctiva/sclera: Conjunctivae normal.  Cardiovascular:     Rate and Rhythm: Normal rate and regular rhythm.     Heart sounds: Normal heart sounds.  Pulmonary:     Effort: Pulmonary effort is normal.     Breath sounds: Normal breath sounds.  Abdominal:     General: Bowel sounds are normal. There is no distension.     Palpations: Abdomen is soft.     Tenderness: There is no abdominal tenderness. There is no right CVA tenderness, left CVA tenderness or guarding.  Genitourinary:    Comments: GU exam deferred, self swab performed Musculoskeletal:        General: Normal range of motion.     Cervical back: Normal range of motion and neck supple.  Skin:    General: Skin is warm and dry.  Neurological:     Mental Status: She is alert and oriented to person, place, and time.  Psychiatric:        Mood and Affect: Mood normal.        Thought Content: Thought content normal.        Judgment: Judgment normal.      UC Treatments / Results  Labs (all labs ordered are listed, but only abnormal results are displayed) Labs Reviewed  HIV ANTIBODY (ROUTINE TESTING W REFLEX)  RPR  CERVICOVAGINAL ANCILLARY ONLY    EKG   Radiology No results found.  Procedures Procedures (including critical care time)  Medications Ordered in UC Medications - No data to display  Initial Impression / Assessment and Plan / UC Course  I have reviewed the triage vital signs and the nursing notes.  Pertinent labs & imaging results that were available during my care of the patient were reviewed by me and considered in my medical decision making (see chart for details).     Cervicovaginal swab, HIV and syphilis labs are pending.  Treat based on results.  Return for worsening  or unresolving symptoms.  Final Clinical Impressions(s) / UC Diagnoses   Final diagnoses:  Acute vaginitis  Screening examination for STI   Discharge Instructions   None    ED Prescriptions   None    PDMP not reviewed this encounter.   Stuart Vernell Norris, [place] 04/05/24 1444

## 2024-04-06 LAB — CERVICOVAGINAL ANCILLARY ONLY
Bacterial Vaginitis (gardnerella): NEGATIVE
Candida Glabrata: NEGATIVE
Candida Vaginitis: NEGATIVE
Chlamydia: NEGATIVE
Comment: NEGATIVE
Comment: NEGATIVE
Comment: NEGATIVE
Comment: NEGATIVE
Comment: NEGATIVE
Comment: NORMAL
Neisseria Gonorrhea: NEGATIVE
Trichomonas: NEGATIVE

## 2024-04-06 LAB — HIV ANTIBODY (ROUTINE TESTING W REFLEX): HIV Screen 4th Generation wRfx: NONREACTIVE

## 2024-04-06 LAB — RPR: RPR Ser Ql: NONREACTIVE

## 2024-05-15 ENCOUNTER — Ambulatory Visit: Payer: Self-pay

## 2024-05-22 ENCOUNTER — Ambulatory Visit

## 2024-06-14 ENCOUNTER — Ambulatory Visit: Payer: Self-pay

## 2024-06-21 ENCOUNTER — Ambulatory Visit (INDEPENDENT_AMBULATORY_CARE_PROVIDER_SITE_OTHER)

## 2024-06-21 DIAGNOSIS — Z23 Encounter for immunization: Secondary | ICD-10-CM | POA: Diagnosis not present

## 2024-06-28 ENCOUNTER — Ambulatory Visit: Payer: Self-pay | Admitting: Internal Medicine

## 2024-08-09 ENCOUNTER — Ambulatory Visit: Admitting: "Endocrinology

## 2024-09-20 ENCOUNTER — Ambulatory Visit: Admitting: Internal Medicine
